# Patient Record
Sex: Female | Born: 1935 | Race: White | Hispanic: No | Marital: Married | Smoking: Former smoker
Health system: Southern US, Community
[De-identification: ages and names within clinical notes are randomized; demographics above are authoritative.]

## PROBLEM LIST (undated history)

## (undated) DIAGNOSIS — I509 Heart failure, unspecified: Secondary | ICD-10-CM

## (undated) DIAGNOSIS — M069 Rheumatoid arthritis, unspecified: Secondary | ICD-10-CM

## (undated) DIAGNOSIS — J449 Chronic obstructive pulmonary disease, unspecified: Secondary | ICD-10-CM

## (undated) DIAGNOSIS — I219 Acute myocardial infarction, unspecified: Secondary | ICD-10-CM

## (undated) DIAGNOSIS — M810 Age-related osteoporosis without current pathological fracture: Secondary | ICD-10-CM

## (undated) DIAGNOSIS — I471 Supraventricular tachycardia, unspecified: Secondary | ICD-10-CM

## (undated) DIAGNOSIS — I10 Essential (primary) hypertension: Secondary | ICD-10-CM

## (undated) DIAGNOSIS — F419 Anxiety disorder, unspecified: Secondary | ICD-10-CM

## (undated) DIAGNOSIS — C439 Malignant melanoma of skin, unspecified: Secondary | ICD-10-CM

## (undated) DIAGNOSIS — J45909 Unspecified asthma, uncomplicated: Secondary | ICD-10-CM

## (undated) HISTORY — PX: ABDOMINAL HYSTERECTOMY: SHX81

## (undated) HISTORY — DX: Anxiety disorder, unspecified: F41.9

## (undated) HISTORY — DX: Supraventricular tachycardia, unspecified: I47.10

## (undated) HISTORY — DX: Chronic obstructive pulmonary disease, unspecified: J44.9

## (undated) HISTORY — PX: BREAST SURGERY: SHX581

## (undated) HISTORY — PX: SHOULDER SURGERY: SHX246

## (undated) HISTORY — DX: Heart failure, unspecified: I50.9

## (undated) HISTORY — PX: CARPAL TUNNEL RELEASE: SHX101

## (undated) HISTORY — DX: Supraventricular tachycardia: I47.1

## (undated) HISTORY — PX: CARDIAC SURGERY: SHX584

## (undated) SURGERY — VIDEO BRONCHOSCOPY WITHOUT FLUORO
Anesthesia: Moderate Sedation

---

## 1997-09-10 DIAGNOSIS — I219 Acute myocardial infarction, unspecified: Secondary | ICD-10-CM

## 1997-09-10 HISTORY — DX: Acute myocardial infarction, unspecified: I21.9

## 2004-06-23 ENCOUNTER — Encounter: Admission: RE | Admit: 2004-06-23 | Discharge: 2004-06-23 | Payer: Self-pay | Admitting: Cardiology

## 2005-01-08 ENCOUNTER — Ambulatory Visit: Payer: Self-pay | Admitting: Family Medicine

## 2005-01-08 ENCOUNTER — Observation Stay (HOSPITAL_COMMUNITY): Admission: EM | Admit: 2005-01-08 | Discharge: 2005-01-09 | Payer: Self-pay | Admitting: *Deleted

## 2005-01-08 ENCOUNTER — Ambulatory Visit: Payer: Self-pay | Admitting: Cardiology

## 2005-06-15 ENCOUNTER — Ambulatory Visit: Payer: Self-pay | Admitting: Family Medicine

## 2005-06-22 ENCOUNTER — Ambulatory Visit: Payer: Self-pay | Admitting: Family Medicine

## 2005-06-27 ENCOUNTER — Ambulatory Visit: Payer: Self-pay | Admitting: Oncology

## 2005-07-09 ENCOUNTER — Ambulatory Visit (HOSPITAL_COMMUNITY): Admission: RE | Admit: 2005-07-09 | Discharge: 2005-07-09 | Payer: Self-pay | Admitting: Surgery

## 2005-07-10 ENCOUNTER — Encounter: Admission: RE | Admit: 2005-07-10 | Discharge: 2005-07-10 | Payer: Self-pay | Admitting: Surgery

## 2005-07-30 ENCOUNTER — Ambulatory Visit: Payer: Self-pay | Admitting: Family Medicine

## 2005-07-31 ENCOUNTER — Ambulatory Visit: Payer: Self-pay | Admitting: Family Medicine

## 2005-08-01 ENCOUNTER — Ambulatory Visit (HOSPITAL_COMMUNITY): Admission: RE | Admit: 2005-08-01 | Discharge: 2005-08-02 | Payer: Self-pay | Admitting: Surgery

## 2005-08-01 ENCOUNTER — Encounter (INDEPENDENT_AMBULATORY_CARE_PROVIDER_SITE_OTHER): Payer: Self-pay | Admitting: *Deleted

## 2005-09-10 HISTORY — PX: MELANOMA EXCISION: SHX5266

## 2005-12-03 ENCOUNTER — Encounter: Admission: RE | Admit: 2005-12-03 | Discharge: 2005-12-03 | Payer: Self-pay | Admitting: Family Medicine

## 2005-12-03 ENCOUNTER — Ambulatory Visit: Payer: Self-pay | Admitting: Family Medicine

## 2005-12-03 ENCOUNTER — Ambulatory Visit: Payer: Self-pay | Admitting: Oncology

## 2005-12-19 ENCOUNTER — Ambulatory Visit: Payer: Self-pay | Admitting: Family Medicine

## 2006-04-11 ENCOUNTER — Encounter: Admission: RE | Admit: 2006-04-11 | Discharge: 2006-04-11 | Payer: Self-pay | Admitting: Surgery

## 2006-04-13 ENCOUNTER — Inpatient Hospital Stay (HOSPITAL_COMMUNITY): Admission: AD | Admit: 2006-04-13 | Discharge: 2006-04-13 | Payer: Self-pay | Admitting: Gynecology

## 2006-05-31 ENCOUNTER — Ambulatory Visit: Payer: Self-pay | Admitting: Oncology

## 2006-06-04 LAB — CBC WITH DIFFERENTIAL/PLATELET
Basophils Absolute: 0 10*3/uL (ref 0.0–0.1)
EOS%: 3.5 % (ref 0.0–7.0)
Eosinophils Absolute: 0.2 10*3/uL (ref 0.0–0.5)
HGB: 13.4 g/dL (ref 11.6–15.9)
LYMPH%: 33.2 % (ref 14.0–48.0)
MCH: 29.5 pg (ref 26.0–34.0)
MCV: 86.7 fL (ref 81.0–101.0)
MONO%: 8.2 % (ref 0.0–13.0)
Platelets: 324 10*3/uL (ref 145–400)
RDW: 14 % (ref 11.3–14.5)

## 2006-06-04 LAB — COMPREHENSIVE METABOLIC PANEL
ALT: 22 U/L (ref 0–40)
Alkaline Phosphatase: 42 U/L (ref 39–117)
Glucose, Bld: 100 mg/dL — ABNORMAL HIGH (ref 70–99)
Sodium: 143 mEq/L (ref 135–145)
Total Bilirubin: 0.6 mg/dL (ref 0.3–1.2)
Total Protein: 7.2 g/dL (ref 6.0–8.3)

## 2006-06-05 ENCOUNTER — Ambulatory Visit: Payer: Self-pay | Admitting: Family Medicine

## 2006-06-06 ENCOUNTER — Ambulatory Visit: Payer: Self-pay | Admitting: Internal Medicine

## 2006-06-18 ENCOUNTER — Ambulatory Visit: Payer: Self-pay | Admitting: Family Medicine

## 2006-11-28 ENCOUNTER — Ambulatory Visit: Payer: Self-pay | Admitting: Oncology

## 2006-12-03 LAB — CBC WITH DIFFERENTIAL/PLATELET
BASO%: 1.2 % (ref 0.0–2.0)
Basophils Absolute: 0.1 10*3/uL (ref 0.0–0.1)
HCT: 36.3 % (ref 34.8–46.6)
HGB: 13 g/dL (ref 11.6–15.9)
LYMPH%: 40 % (ref 14.0–48.0)
MCHC: 35.7 g/dL (ref 32.0–36.0)
MONO#: 0.4 10*3/uL (ref 0.1–0.9)
NEUT%: 46.1 % (ref 39.6–76.8)
Platelets: 281 10*3/uL (ref 145–400)
WBC: 4.8 10*3/uL (ref 3.9–10.0)

## 2006-12-03 LAB — COMPREHENSIVE METABOLIC PANEL
BUN: 20 mg/dL (ref 6–23)
CO2: 27 mEq/L (ref 19–32)
Calcium: 9.3 mg/dL (ref 8.4–10.5)
Creatinine, Ser: 0.77 mg/dL (ref 0.40–1.20)
Glucose, Bld: 74 mg/dL (ref 70–99)
Total Bilirubin: 0.4 mg/dL (ref 0.3–1.2)

## 2006-12-03 LAB — LACTATE DEHYDROGENASE: LDH: 173 U/L (ref 94–250)

## 2007-05-19 DIAGNOSIS — I2510 Atherosclerotic heart disease of native coronary artery without angina pectoris: Secondary | ICD-10-CM | POA: Insufficient documentation

## 2007-05-19 DIAGNOSIS — J45909 Unspecified asthma, uncomplicated: Secondary | ICD-10-CM | POA: Insufficient documentation

## 2007-05-19 DIAGNOSIS — J449 Chronic obstructive pulmonary disease, unspecified: Secondary | ICD-10-CM

## 2007-05-19 DIAGNOSIS — M810 Age-related osteoporosis without current pathological fracture: Secondary | ICD-10-CM | POA: Insufficient documentation

## 2007-05-19 DIAGNOSIS — I10 Essential (primary) hypertension: Secondary | ICD-10-CM | POA: Insufficient documentation

## 2007-05-19 DIAGNOSIS — E785 Hyperlipidemia, unspecified: Secondary | ICD-10-CM | POA: Insufficient documentation

## 2007-05-19 DIAGNOSIS — J441 Chronic obstructive pulmonary disease with (acute) exacerbation: Secondary | ICD-10-CM | POA: Insufficient documentation

## 2007-05-19 DIAGNOSIS — K219 Gastro-esophageal reflux disease without esophagitis: Secondary | ICD-10-CM | POA: Insufficient documentation

## 2007-05-19 DIAGNOSIS — I252 Old myocardial infarction: Secondary | ICD-10-CM | POA: Insufficient documentation

## 2007-05-20 DIAGNOSIS — I739 Peripheral vascular disease, unspecified: Secondary | ICD-10-CM | POA: Insufficient documentation

## 2007-08-25 ENCOUNTER — Ambulatory Visit: Payer: Self-pay | Admitting: Oncology

## 2007-08-27 ENCOUNTER — Ambulatory Visit (HOSPITAL_COMMUNITY): Admission: RE | Admit: 2007-08-27 | Discharge: 2007-08-27 | Payer: Self-pay | Admitting: Oncology

## 2007-08-27 LAB — COMPREHENSIVE METABOLIC PANEL
ALT: 33 U/L (ref 0–35)
Alkaline Phosphatase: 42 U/L (ref 39–117)
CO2: 27 mEq/L (ref 19–32)
Creatinine, Ser: 0.83 mg/dL (ref 0.40–1.20)
Sodium: 141 mEq/L (ref 135–145)
Total Bilirubin: 0.5 mg/dL (ref 0.3–1.2)
Total Protein: 6.8 g/dL (ref 6.0–8.3)

## 2007-08-27 LAB — CBC WITH DIFFERENTIAL/PLATELET
BASO%: 0.8 % (ref 0.0–2.0)
EOS%: 3.6 % (ref 0.0–7.0)
HCT: 38.2 % (ref 34.8–46.6)
LYMPH%: 38.5 % (ref 14.0–48.0)
MCH: 30.7 pg (ref 26.0–34.0)
MCHC: 34.7 g/dL (ref 32.0–36.0)
MCV: 88.6 fL (ref 81.0–101.0)
MONO%: 8.4 % (ref 0.0–13.0)
NEUT%: 48.7 % (ref 39.6–76.8)
Platelets: 293 10*3/uL (ref 145–400)
RBC: 4.32 10*6/uL (ref 3.70–5.32)
WBC: 5.8 10*3/uL (ref 3.9–10.0)

## 2007-08-27 LAB — LACTATE DEHYDROGENASE: LDH: 209 U/L (ref 94–250)

## 2008-03-22 ENCOUNTER — Ambulatory Visit: Payer: Self-pay | Admitting: Oncology

## 2008-03-25 LAB — CBC WITH DIFFERENTIAL/PLATELET
BASO%: 0.3 % (ref 0.0–2.0)
EOS%: 0.4 % (ref 0.0–7.0)
MCH: 30.5 pg (ref 26.0–34.0)
MCHC: 34.4 g/dL (ref 32.0–36.0)
MONO%: 5.6 % (ref 0.0–13.0)
RDW: 12.5 % (ref 11.3–14.5)
lymph#: 1.5 10*3/uL (ref 0.9–3.3)

## 2008-03-25 LAB — COMPREHENSIVE METABOLIC PANEL
ALT: 35 U/L (ref 0–35)
AST: 20 U/L (ref 0–37)
Albumin: 4.7 g/dL (ref 3.5–5.2)
Alkaline Phosphatase: 32 U/L — ABNORMAL LOW (ref 39–117)
Calcium: 9.9 mg/dL (ref 8.4–10.5)
Chloride: 103 mEq/L (ref 96–112)
Creatinine, Ser: 0.83 mg/dL (ref 0.40–1.20)
Potassium: 4.4 mEq/L (ref 3.5–5.3)

## 2008-08-23 ENCOUNTER — Ambulatory Visit: Payer: Self-pay | Admitting: Oncology

## 2008-08-25 ENCOUNTER — Ambulatory Visit (HOSPITAL_COMMUNITY): Admission: RE | Admit: 2008-08-25 | Discharge: 2008-08-25 | Payer: Self-pay | Admitting: Oncology

## 2008-08-25 LAB — CBC WITH DIFFERENTIAL/PLATELET
BASO%: 0.7 % (ref 0.0–2.0)
Basophils Absolute: 0 10*3/uL (ref 0.0–0.1)
EOS%: 2.2 % (ref 0.0–7.0)
Eosinophils Absolute: 0.1 10*3/uL (ref 0.0–0.5)
HCT: 38.8 % (ref 34.8–46.6)
HGB: 13.3 g/dL (ref 11.6–15.9)
LYMPH%: 34 % (ref 14.0–48.0)
MCH: 30.9 pg (ref 26.0–34.0)
MCHC: 34.2 g/dL (ref 32.0–36.0)
MCV: 90.3 fL (ref 81.0–101.0)
MONO#: 0.6 10*3/uL (ref 0.1–0.9)
MONO%: 10.7 % (ref 0.0–13.0)
NEUT#: 2.8 10*3/uL (ref 1.5–6.5)
NEUT%: 52.4 % (ref 39.6–76.8)
Platelets: 273 10*3/uL (ref 145–400)
RBC: 4.3 10*6/uL (ref 3.70–5.32)
RDW: 13.1 % (ref 11.3–14.5)
WBC: 5.3 10*3/uL (ref 3.9–10.0)
lymph#: 1.8 10*3/uL (ref 0.9–3.3)

## 2008-08-25 LAB — COMPREHENSIVE METABOLIC PANEL
ALT: 27 U/L (ref 0–35)
Albumin: 4.3 g/dL (ref 3.5–5.2)
CO2: 27 mEq/L (ref 19–32)
Calcium: 9.3 mg/dL (ref 8.4–10.5)
Chloride: 101 mEq/L (ref 96–112)
Potassium: 3.8 mEq/L (ref 3.5–5.3)
Sodium: 137 mEq/L (ref 135–145)
Total Protein: 6.4 g/dL (ref 6.0–8.3)

## 2008-08-25 LAB — LACTATE DEHYDROGENASE: LDH: 183 U/L (ref 94–250)

## 2009-08-30 ENCOUNTER — Ambulatory Visit: Payer: Self-pay | Admitting: Oncology

## 2009-08-30 LAB — COMPREHENSIVE METABOLIC PANEL
ALT: 22 U/L (ref 0–35)
AST: 26 U/L (ref 0–37)
Albumin: 4.7 g/dL (ref 3.5–5.2)
Alkaline Phosphatase: 36 U/L — ABNORMAL LOW (ref 39–117)
BUN: 26 mg/dL — ABNORMAL HIGH (ref 6–23)
CO2: 26 mEq/L (ref 19–32)
Calcium: 9.9 mg/dL (ref 8.4–10.5)
Chloride: 102 mEq/L (ref 96–112)
Creatinine, Ser: 1.05 mg/dL (ref 0.40–1.20)
Glucose, Bld: 100 mg/dL — ABNORMAL HIGH (ref 70–99)
Potassium: 4.6 mEq/L (ref 3.5–5.3)
Sodium: 139 mEq/L (ref 135–145)
Total Bilirubin: 0.5 mg/dL (ref 0.3–1.2)
Total Protein: 7.4 g/dL (ref 6.0–8.3)

## 2009-08-30 LAB — CBC WITH DIFFERENTIAL/PLATELET
BASO%: 0.5 % (ref 0.0–2.0)
Basophils Absolute: 0 10*3/uL (ref 0.0–0.1)
EOS%: 2.5 % (ref 0.0–7.0)
Eosinophils Absolute: 0.1 10*3/uL (ref 0.0–0.5)
HCT: 40.1 % (ref 34.8–46.6)
HGB: 13.6 g/dL (ref 11.6–15.9)
LYMPH%: 30.5 % (ref 14.0–49.7)
MCH: 31.4 pg (ref 25.1–34.0)
MCHC: 34 g/dL (ref 31.5–36.0)
MCV: 92.2 fL (ref 79.5–101.0)
MONO#: 0.5 10*3/uL (ref 0.1–0.9)
MONO%: 9.3 % (ref 0.0–14.0)
NEUT#: 3.4 10*3/uL (ref 1.5–6.5)
NEUT%: 57.2 % (ref 38.4–76.8)
Platelets: 281 10*3/uL (ref 145–400)
RBC: 4.35 10*6/uL (ref 3.70–5.45)
RDW: 13 % (ref 11.2–14.5)
WBC: 5.9 10*3/uL (ref 3.9–10.3)
lymph#: 1.8 10*3/uL (ref 0.9–3.3)

## 2009-08-30 LAB — LACTATE DEHYDROGENASE: LDH: 221 U/L (ref 94–250)

## 2010-08-28 ENCOUNTER — Ambulatory Visit: Payer: Self-pay | Admitting: Oncology

## 2010-08-29 LAB — CBC WITH DIFFERENTIAL/PLATELET
BASO%: 0.6 % (ref 0.0–2.0)
Basophils Absolute: 0.1 10*3/uL (ref 0.0–0.1)
EOS%: 0.4 % (ref 0.0–7.0)
Eosinophils Absolute: 0 10*3/uL (ref 0.0–0.5)
HCT: 35.4 % (ref 34.8–46.6)
HGB: 12.3 g/dL (ref 11.6–15.9)
LYMPH%: 12.8 % — ABNORMAL LOW (ref 14.0–49.7)
MCH: 31.2 pg (ref 25.1–34.0)
MCHC: 34.7 g/dL (ref 31.5–36.0)
MCV: 89.9 fL (ref 79.5–101.0)
MONO#: 1.2 10*3/uL — ABNORMAL HIGH (ref 0.1–0.9)
MONO%: 10 % (ref 0.0–14.0)
NEUT#: 8.8 10*3/uL — ABNORMAL HIGH (ref 1.5–6.5)
NEUT%: 76.2 % (ref 38.4–76.8)
Platelets: 279 10*3/uL (ref 145–400)
RBC: 3.94 10*6/uL (ref 3.70–5.45)
RDW: 13.1 % (ref 11.2–14.5)
WBC: 11.5 10*3/uL — ABNORMAL HIGH (ref 3.9–10.3)
lymph#: 1.5 10*3/uL (ref 0.9–3.3)

## 2010-08-29 LAB — COMPREHENSIVE METABOLIC PANEL
ALT: 14 U/L (ref 0–35)
AST: 17 U/L (ref 0–37)
Albumin: 4.2 g/dL (ref 3.5–5.2)
Alkaline Phosphatase: 54 U/L (ref 39–117)
BUN: 21 mg/dL (ref 6–23)
CO2: 26 mEq/L (ref 19–32)
Calcium: 9.3 mg/dL (ref 8.4–10.5)
Chloride: 96 mEq/L (ref 96–112)
Creatinine, Ser: 1.11 mg/dL (ref 0.40–1.20)
Glucose, Bld: 103 mg/dL — ABNORMAL HIGH (ref 70–99)
Potassium: 4.1 mEq/L (ref 3.5–5.3)
Sodium: 132 mEq/L — ABNORMAL LOW (ref 135–145)
Total Bilirubin: 0.8 mg/dL (ref 0.3–1.2)
Total Protein: 6.6 g/dL (ref 6.0–8.3)

## 2010-08-29 LAB — LACTATE DEHYDROGENASE: LDH: 178 U/L (ref 94–250)

## 2011-01-26 NOTE — Discharge Summary (Signed)
NAMERASHAWN, ROLON NO.:  [id number]   MEDICAL RECORD NO.:  [id number]          PATIENT TYPE:  INP   LOCATION:  3740                         FACILITY:  [facility]   PHYSICIAN:  Salvadore Farber, M.D. LHCDATE OF BIRTH:  [birth year]   DATE OF ADMISSION:  01/08/2005  DATE OF DISCHARGE:  01/09/2005                                 DISCHARGE SUMMARY   PRINCIPAL DIAGNOSIS:  Chest pain.   OTHER DIAGNOSES:  1.  Hypertension.  2.  Hyperlipidemia.  3.  Gastroesophageal reflux disease.  4.  Status post bilateral mastectomy in 1983 secondary to fibrotic disease.  5.  Status post hysterectomy in 1964.   ALLERGIES:  No known drug allergies.   PROCEDURE:  Adenosine Myoview which was negative.   HISTORY OF PRESENT ILLNESS:  77 year old married white female with reported  history of MI in 1999 was evaluated with catheterization at [facility] in [place] which showed no significant disease.  She had recurrent chest discomfort in 2001 and was evaluated with cardiac  catheterization at [facility], again showing no significant disease, and  at that time, she was initiated on Prevacid for treatment of GERD.  Over the  years, she has been doing well and has been exercising daily, walking 30-45  minutes a day without limitations until approximately one week ago when she  began to experience daily exertional 8-10 retrosternal chest pressure  associated with shortness of breath and occasional light headedness  lasting  approximately 15-20 minutes and relieved with rest.  She often feels  fatigued for several hours after these symptoms and noted that these  symptoms are exactly similar to what she had prior to other catheterization  at which time no coronary disease was noted.  She saw her primary care  physician on the morning of May 1 and had an EKG which was normal and was  sent to the ED secondary to her recent bout of symptoms.  In the ED, she was  pain free and the EKG was normal.  Cardiac enzymes were negative x 3.  Because her symptoms presently are similar to what she had prior to previous  normal catheterizations as well as the fact that the patient wanted to avoid  cardiac catheterization, we opted for Adenosine Myoview.   HOSPITAL COURSE:  The patient underwent Adenosine Myoview functional study  this morning which showed an EF of 55-65% with normal wall motion and  possible inferobasal scar without reversible ischemia.  She has not had any  recurrent chest discomfort and she is being discharged home today in  satisfactory condition.   LABORATORY DATA:  Hemoglobin 13.8, hematocrit 40.8, WBC 6.2, platelets 319.  Sodium 139, potassium 4, chloride 107, CO2 26, BUN 19, creatinine 0.7,  glucose 95, total bilirubin 0.8, alkaline phos 62, AST 26, ALT 28, total  protein 7.1, albumin 4.5, calcium 9.5, protime 12, INR 0.9, PTT 37.  Cardiac  enzymes are negative x 3.  TSH 1.348.   DISCHARGE PHYSICAL EXAM:  Temperature 98, heart rate 57, respirations 18,  blood pressure 130/70, pulse ox 94% on room  air.  Pleasant white female in  no acute distress.  Awake, alert, and oriented x 3.  Neck normal carotid  upstrokes, no bruits, no JVD.  Lungs:  Respirations regular and unlabored,  clear to auscultation.  Heart:  Normal S1 and S2, no S3, S4, or murmurs.  Abdomen:  Round, soft, nontender, nondistended, bowel sounds present x 4, no  hepatomegaly.  Extremities:  Warm, dry, and pink.  No cyanosis, clubbing,  and edema.  Dorsalis pedes and posterior tibial pulses are 2+ and equal  bilaterally.   DISPOSITION:  The patient is being discharged home in good condition.  Follow up with her primary care physician, Dr. Tinnie Gens A. Todd, in 1-2 weeks  for further evaluation of noncardiac chest pain.   DISCHARGE MEDICATIONS:  Prevacid 30 mg b.i.d., Lotensin 20 mg b.i.d., Toprol  XL 100 mg b.i.d., Lipitor 20 mg daily, Norvasc 5 mg daily, Fosamax 70 mg   each week, Combivent inhaler 2 puffs daily.   Outstanding lab studies are none.  Duration of discharge encounter 40  minutes.      CRB/MEDQ  D:  01/09/2005  T:  01/09/2005  Job:  [id number]   cc:   Tinnie Gens A. Tawanna Cooler, M.D. [facility]

## 2011-01-26 NOTE — Discharge Summary (Signed)
NAMEMARIELOUISE, AMEY NO.:  [id number]   MEDICAL RECORD NO.:  [id number]          PATIENT TYPE:  INP   LOCATION:  5712                         FACILITY:  [facility]   PHYSICIAN:  Clovis Pu. Cornett, M.D.DATE OF BIRTH:  16-Apr-1933   DATE OF ADMISSION:  08/01/2005  DATE OF DISCHARGE:  08/02/2005                                 DISCHARGE SUMMARY   ADMISSION DIAGNOSIS:  Left scalp melanoma.   DISCHARGE DIAGNOSIS:  Left scalp melanoma.   PROCEDURE PERFORMED:  Wide excision left scalp melanoma with full thickness  skin graft and sentinel lymph node mapping.   HISTORY OF PRESENT ILLNESS:  The patient is a 77 year old female, who was  found to have a left scalp melanoma. She was brought into the operating room  for wide excision and sentinel lymph node mapping of this.   HOSPITAL COURSE:  The patient underwent successful wide excision of left  scalp melanoma, with sentinel lymph node mapping, with full thickness skin  graft.   She was discharged home on postoperative day #1 in satisfactory condition,  without any complaints.   DISCHARGE INSTRUCTIONS:  She will follow up next week to have her dressings  and sutures removed. She will refrain from washing her hair until she  returns. She will resume her preoperative medications and diet.   CONDITION ON DISCHARGE:  Satisfactory.      Thomas A. Cornett, M.D.  Electronically Signed     TAC/MEDQ  D:  09/26/2005  T:  09/26/2005  Job:  [id number]

## 2011-01-26 NOTE — H&P (Signed)
[name], [name] NO.:  [id number]   MEDICAL RECORD NO.:  [id number]          PATIENT TYPE:  INP   LOCATION:  1829                         FACILITY:  [facility]   PHYSICIAN:  [name], M.D.   DATE OF BIRTH:  12-May-1933   DATE OF ADMISSION:  01/08/2005  DATE OF DISCHARGE:                                HISTORY & PHYSICAL   PHYSICIANS:  Primary care physician: [name], M.D.  Primary cardiologist:  [name], M.D.   PATIENT PROFILE AND CHIEF COMPLAINT:  A 77 year old white female with prior  history of chest pain who presents with recurrent chest pain and shortness  of breath similar to previous symptoms.   PAST MEDICAL HISTORY AND PROBLEM LIST:  1.  Chest pain.      1.  Status post myocardial infarction in 1999 (per patient) with          catheterization performed at [facility] in          [place] showing normal coronaries.      2.  Re-look catheterization at [facility] in 2001 revealing normal          coronaries.  2.  Hypertension.  3.  Hyperlipidemia.  4.  Gastroesophageal reflux disease.  5.  Status post bilateral mastectomy in 1982 secondary to fibrotic disease.  6.  Status post hysterectomy in 1964.   HISTORY OF PRESENT ILLNESS:  A 77 year old (will be 5 on May 2) married  white female with a history of MI in 1999 per patient that was evaluated  with catheterization at [facility] in [place]  which showed no significant disease.  The last catheterization was in 2001  in [facility], again showing no significant CAD.  At that time she was  diagnosed with GERD and started on Prevacid.  Over the years, she has been  doing well and has been exercising daily, walking 30 to 45 minutes a day  without limitations until approximately one week ago when she began to  experience daily, exertional, 8/10 retrosternal chest pressure associated  with shortness of breath and occasional  lightheadedness lasting  approximately 15 to 20 minutes and relieved with rest.  She often feels  fatigues for several hours after these symptoms and notes that these  symptoms are exactly similar to what she had prior to other  catheterizations.  She saw her primary care physician this a.m..  Her ECG  was normal, and she was sent to the ED.  She has not had any chest pain  today.  Cardiac enzymes are currently pending.   ALLERGIES:  No known drug allergies.   MEDICATIONS:  1.  Prevacid 30 mg daily.  2.  Lotensin 20 mg b.i.d.  3.  Toprol XL 100 mg b.i.d.  4.  Lipitor 20 mg daily.  5.  Norvasc 5 mg daily.  6.  Fosamax 70 mg weekly.  7.  Combivent 2 puffs daily.   FAMILY HISTORY:  Mother died of breast cancer at age 60.  Father died of  colon cancer at age 39.  She has two brothers and a sister.  There is CAD/MI  and hypertension in one brother.   SOCIAL HISTORY:  She lives in [place] with her husband.  She is  currently retired.  She has three grown children.  She smoked one-half pack  a day for 40 years and quit in 1993.  She denies any alcohol or drugs.   REVIEW OF SYSTEMS:  She reports chest pain, shortness of breath, and dyspnea  on exertion and also has a history of nocturia.  All other systems reviewed  and negative.   PHYSICAL EXAMINATION:  VITAL SIGNS:  Temperature 97.1, heart rate 63,  respirations 18, blood pressure 185/91, pulse oximetry 99% on room air.  GENERAL: Pleasant white female in no acute distress.  Awake, alert, oriented  x 3.  NECK:  Normal carotid upstrokes, no bruits or JVD.  LUNGS:  Respirations unlabored, clear to auscultation.  CARDIAC:  Regular S1 and S2.  No S3, S4, or murmurs.  ABDOMEN:  Round, soft, nontender, nondistended.  Bowel sounds present x 4.  No hepatomegaly.  EXTREMITIES:  Warm, dry, pink.  No clubbing, cyanosis, or edema.  Dorsalis  pedis, posterior tibial pulses 2+ and equal bilaterally.   ACCESSORY CLINICAL FINDINGS:  Chest  x-ray is pending.   EKG shows sinus rhythm at rate of 60 beats per minute, normal axis, and T  wave conversion in leads aVL, V1, V2.   Hemoglobin 13.8, hematocrit 40.8, WBC 6.2, platelets 319.  CHEM-7 and  cardiac enzymes are pending.  PTT 37, PT 12.0, INR 0.9.   ASSESSMENT AND PLAN:  1.  Chest pain.  The patient has a history of exertional chest pain dating      back to at least 1999 at which time she says she had an myocardial      infarction and then had catheterization showing no significant disease.      She had another catheterization in 2001 at [facility] which again      showed no significant disease.  Since then, she has been treated for      gastroesophageal reflux disease with Prevacid.  She is fairly active at      home and had been doing well until about a week ago when she began to      have recurrent exertional chest discomfort similar to previous symptoms.      Will cycle her enzymes.  Given her history of similar chest pain with      subsequent normal catheterizations as well as her wishes to avoid      catheterization, will plan on an adenosine Myoview in the a.m.  We would      prefer to exercise her; however, given the fact that she has been taking      Toprol XL 100 b.i.d., it is unlikely we are going to get her heart rate      to where we want it.  Further, she is going to need her Toprol for blood      pressure control given her hypertension.  Will continue aspirin and      statin, Lotensin, and Lipitor.  2.  Hypertension.  Blood pressure is elevated at 185/91.  Will continue her      current regimen and increase Norvasc to 10 mg daily.  I see in the      records that she has previously been treated with clonidine but is not      currently  treated with that.  3.  Hyperlipidemia.  Will check lipids and LFTs.  Continue Lipitor.  4.  Gastroesophageal reflux disease, stable.  Continue PPI.     CRB/MEDQ  D:  01/08/2005  T:  01/08/2005  Job:  [id number]    cc:   [name], M.D. [facility]   [name], M.D. [facility]  [address]  [address]  [place]  [place] [address]

## 2011-01-26 NOTE — Op Note (Signed)
[name], [name] NO.:  [id number]   MEDICAL RECORD NO.:  [id number]          PATIENT TYPE:  INP   LOCATION:  5712                         FACILITY:  [facility]   PHYSICIAN:  Clovis Pu. Carey, M.D.DATE OF BIRTH:  05-25-1933   DATE OF PROCEDURE:  DATE OF DISCHARGE:                                 OPERATIVE REPORT   PREOP DIAGNOSIS:  Left scalp melanoma.   POSTOP DIAGNOSIS:  Left scalp melanoma.   PROCEDURE:  1.  Wide excision measuring roughly 3 x 3 cm of left scalp melanoma.  2.  Left cervical sentinel lymph node mapping with methylene blue dye.  3.  Full-thickness skin graft for closure of left scalp wound.   SURGEON:  Maisie Fus A. Carey, M.D.   ASSISTANT:  Ollen Gross. Carolynne Edouard, M.D.   ANESTHESIA:  General endotracheal anesthesia.   ESTIMATED BLOOD LOSS:  20 mL.   SPECIMEN:  1.  Left scalp skin specimen to pathology.  2.  Two left posterior auricular sentinel nodes, blue and hot to pathology      for further evaluation.   DRAINS:  None.   INDICATIONS FOR PROCEDURE:  The patient is a 77 year old female was noted to  have a 1-mm, left scalp melanoma. She was given a Clark's level 3.  After  examining her I recommended a wide excision with sentinel lymph node mapping  and potential skin grafting to close the defect.   DESCRIPTION OF PROCEDURE:  After the patient underwent sentinel lymph node  injection in radiology, she was brought to the operating room and placed  supine, initially on the table. General endotracheal anesthesia was  initiated and she was placed with her left side up; and was appropriately  cushioned and taped, such that her right side was down. At this point, her  skin hair was shaved from around the lesion, which was on her left scalp.  Also a NeoProbe was used; and there were hot nodes in the posterior  auricular drainage basin; this is on the left side.   Next, the areas were prepped and draped in a sterile fashion. I started with  the  lymph node mapping first. Methylene blue dye was injected around the  melanoma and let to circulate for 5 minutes. NeoProbe was used and a small  incision was made in the posterior auricular region over the mastoid.  Dissection was carried down and two hot blue nodes were encountered in the  posterior auricular region which were sent to pathology for further  evaluation. The remainder of the cervical region had counts, much less than  10% below the injection sites.   Next, the melanoma was addressed. I measured a 1-cm margin circumferentially  and then excised the melanoma with a 1-cm margin down to the skull. This was  done, and the specimen was passed off the field. Given the defect measured  3.3 cm, I elected to close the ends of the defect with interrupted 3-0  nylon. For the remainder of the defect I felt a full-thickness skin graft  would be warranted.  I harvested skin just behind her left ear  with some  hair follicles in it, such that it would match her hair there that was  shaved. This was done and excised down to subcu fat. I then took the skin  specimen and removed all fat from the undersurface of the skin specimen to  where I was down to dermis. I then fenestrated the skin graft with a #11  blade to facilitate drainage and also to get more stretch out of it. I then  sewed this into the defect with a 3-0 nylon circumferentially. There was  some tension on the skin graft, but it seemed to cover the defect  adequately.   I then closed the donor site with a 3-0 Vicryl in a subcuticular fashion. At  this point in time, I then placed six bolster sutures using 3-0 nylon around  the skin graft site. I then placed Xeroform over the skin graft and two  large cotton balls and tied the bolster sutures around compressing the skin  graft to the skull for good contact. Neosporin was placed at all these  sites. After closing both of these sentinel lymph nodes harvest site which  was in the  posterior the region on the left as well as a harvest site which  is above that in the posterior auricular region on the left, Steri-Strips  were placed on both of these.   The patient was then wrapped in a sterile dressing for her head and a burn  net was used to hold this into place. The patient was then placed supine,  extubated, and taken to recovery in satisfactory condition. All sponge,  needle, and instrument counts were counted and found to be correct at this  portion of the case.      [name], M.D.  Electronically Signed     TAC/MEDQ  D:  08/01/2005  T:  08/01/2005  Job:  [id number]   cc:   Tinnie Gens A. Tawanna Cooler, M.D. [facility]  [address] [place]  [place] [address]   [facility]

## 2011-09-11 HISTORY — PX: MELANOMA EXCISION: SHX5266

## 2013-02-22 ENCOUNTER — Emergency Department (HOSPITAL_COMMUNITY)
Admission: EM | Admit: 2013-02-22 | Discharge: 2013-02-22 | Disposition: A | Discharge status: Nursing Home (Includes ICF) | Payer: BC Managed Care – PPO | Source: Home / Self Care | Attending: Emergency Medicine | Admitting: Emergency Medicine

## 2013-02-22 ENCOUNTER — Other Ambulatory Visit: Payer: Self-pay

## 2013-02-22 ENCOUNTER — Observation Stay (HOSPITAL_COMMUNITY)
Admission: EM | Admit: 2013-02-22 | Discharge: 2013-02-23 | DRG: 313 | Disposition: A | Discharge status: Home / Self Care | Payer: MEDICARE | Attending: Internal Medicine | Admitting: Internal Medicine

## 2013-02-22 ENCOUNTER — Encounter (HOSPITAL_COMMUNITY): Payer: Self-pay | Admitting: Vascular Surgery

## 2013-02-22 ENCOUNTER — Emergency Department (HOSPITAL_COMMUNITY): Payer: MEDICARE

## 2013-02-22 ENCOUNTER — Encounter (HOSPITAL_COMMUNITY): Payer: Self-pay | Admitting: Emergency Medicine

## 2013-02-22 DIAGNOSIS — I739 Peripheral vascular disease, unspecified: Secondary | ICD-10-CM

## 2013-02-22 DIAGNOSIS — R197 Diarrhea, unspecified: Secondary | ICD-10-CM | POA: Insufficient documentation

## 2013-02-22 DIAGNOSIS — I10 Essential (primary) hypertension: Secondary | ICD-10-CM | POA: Insufficient documentation

## 2013-02-22 DIAGNOSIS — K219 Gastro-esophageal reflux disease without esophagitis: Secondary | ICD-10-CM | POA: Diagnosis present

## 2013-02-22 DIAGNOSIS — M069 Rheumatoid arthritis, unspecified: Secondary | ICD-10-CM

## 2013-02-22 DIAGNOSIS — J441 Chronic obstructive pulmonary disease with (acute) exacerbation: Secondary | ICD-10-CM | POA: Diagnosis present

## 2013-02-22 DIAGNOSIS — R0789 Other chest pain: Principal | ICD-10-CM | POA: Insufficient documentation

## 2013-02-22 DIAGNOSIS — J4489 Other specified chronic obstructive pulmonary disease: Secondary | ICD-10-CM | POA: Insufficient documentation

## 2013-02-22 DIAGNOSIS — R82998 Other abnormal findings in urine: Secondary | ICD-10-CM | POA: Insufficient documentation

## 2013-02-22 DIAGNOSIS — J45909 Unspecified asthma, uncomplicated: Secondary | ICD-10-CM | POA: Diagnosis present

## 2013-02-22 DIAGNOSIS — I252 Old myocardial infarction: Secondary | ICD-10-CM | POA: Insufficient documentation

## 2013-02-22 DIAGNOSIS — J449 Chronic obstructive pulmonary disease, unspecified: Secondary | ICD-10-CM

## 2013-02-22 DIAGNOSIS — R079 Chest pain, unspecified: Secondary | ICD-10-CM

## 2013-02-22 DIAGNOSIS — E785 Hyperlipidemia, unspecified: Secondary | ICD-10-CM | POA: Insufficient documentation

## 2013-02-22 DIAGNOSIS — I209 Angina pectoris, unspecified: Secondary | ICD-10-CM

## 2013-02-22 DIAGNOSIS — Z79899 Other long term (current) drug therapy: Secondary | ICD-10-CM | POA: Insufficient documentation

## 2013-02-22 DIAGNOSIS — R112 Nausea with vomiting, unspecified: Secondary | ICD-10-CM | POA: Insufficient documentation

## 2013-02-22 DIAGNOSIS — I2510 Atherosclerotic heart disease of native coronary artery without angina pectoris: Secondary | ICD-10-CM

## 2013-02-22 HISTORY — DX: Essential (primary) hypertension: I10

## 2013-02-22 HISTORY — DX: Acute myocardial infarction, unspecified: I21.9

## 2013-02-22 HISTORY — DX: Rheumatoid arthritis, unspecified: M06.9

## 2013-02-22 HISTORY — DX: Unspecified asthma, uncomplicated: J45.909

## 2013-02-22 HISTORY — DX: Malignant melanoma of skin, unspecified: C43.9

## 2013-02-22 HISTORY — DX: Age-related osteoporosis without current pathological fracture: M81.0

## 2013-02-22 LAB — URINALYSIS, ROUTINE W REFLEX MICROSCOPIC
Nitrite: NEGATIVE
Specific Gravity, Urine: 1.014 (ref 1.005–1.030)
Urobilinogen, UA: 0.2 mg/dL (ref 0.0–1.0)
pH: 6.5 (ref 5.0–8.0)

## 2013-02-22 LAB — URINE MICROSCOPIC-ADD ON

## 2013-02-22 LAB — CBC
HCT: 35.1 % — ABNORMAL LOW (ref 36.0–46.0)
Hemoglobin: 12.2 g/dL (ref 12.0–15.0)
MCH: 30.9 pg (ref 26.0–34.0)
MCHC: 34.8 g/dL (ref 30.0–36.0)
MCV: 88.9 fL (ref 78.0–100.0)

## 2013-02-22 LAB — COMPREHENSIVE METABOLIC PANEL
BUN: 18 mg/dL (ref 6–23)
Calcium: 10 mg/dL (ref 8.4–10.5)
Creatinine, Ser: 0.73 mg/dL (ref 0.50–1.10)
GFR calc Af Amer: >90 mL/min (ref >90)
Glucose, Bld: 91 mg/dL (ref 70–99)
Sodium: 138 mEq/L (ref 135–145)
Total Protein: 7 g/dL (ref 6.0–8.3)

## 2013-02-22 LAB — POCT I-STAT, CHEM 8
Glucose, Bld: 92 mg/dL (ref 70–99)
HCT: 35 % — ABNORMAL LOW (ref 36.0–46.0)
Hemoglobin: 11.9 g/dL — ABNORMAL LOW (ref 12.0–15.0)
Potassium: 3.7 mEq/L (ref 3.5–5.1)
Sodium: 137 mEq/L (ref 135–145)
TCO2: 25 mmol/L (ref 0–100)

## 2013-02-22 LAB — PROTIME-INR: Prothrombin Time: 13.1 seconds (ref 11.6–15.2)

## 2013-02-22 LAB — MAGNESIUM: Magnesium: 1.8 mg/dL (ref 1.5–2.5)

## 2013-02-22 LAB — POCT I-STAT TROPONIN I

## 2013-02-22 MED ORDER — MORPHINE SULFATE 2 MG/ML IJ SOLN: 1.0000 mg | INTRAMUSCULAR | Status: DC | PRN

## 2013-02-22 MED ORDER — BENAZEPRIL HCL 20 MG PO TABS
20.0000 mg | ORAL_TABLET | Freq: Every day | ORAL | Status: DC
  Administered 2013-02-23: 20 mg via ORAL
  Filled 2013-02-22: qty 1

## 2013-02-22 MED ORDER — CALCIUM CARBONATE 600 MG PO TABS
600.0000 mg | ORAL_TABLET | Freq: Two times a day (BID) | ORAL | Status: DC
  Filled 2013-02-22 (×3): qty 1

## 2013-02-22 MED ORDER — GLUCOSAMINE HCL-GLUCOSAMIN SO4 200-300 MG PO TABS: 1.0000 | ORAL_TABLET | Freq: Every day | ORAL | Status: DC

## 2013-02-22 MED ORDER — ACETAMINOPHEN 650 MG RE SUPP: 650.0000 mg | Freq: Four times a day (QID) | RECTAL | Status: DC | PRN

## 2013-02-22 MED ORDER — NITROGLYCERIN 0.4 MG SL SUBL: 0.4000 mg | SUBLINGUAL_TABLET | SUBLINGUAL | Status: DC | PRN

## 2013-02-22 MED ORDER — FOLIC ACID 1 MG PO TABS
1.0000 mg | ORAL_TABLET | Freq: Every day | ORAL | Status: DC
  Administered 2013-02-23: 1 mg via ORAL
  Filled 2013-02-22: qty 1

## 2013-02-22 MED ORDER — MOMETASONE FURO-FORMOTEROL FUM 100-5 MCG/ACT IN AERO
2.0000 | INHALATION_SPRAY | Freq: Two times a day (BID) | RESPIRATORY_TRACT | Status: DC
  Administered 2013-02-22 – 2013-02-23 (×2): 2 via RESPIRATORY_TRACT
  Filled 2013-02-22: qty 8.8

## 2013-02-22 MED ORDER — SODIUM CHLORIDE 0.9 % IJ SOLN: 3.0000 mL | INTRAMUSCULAR | Status: DC | PRN

## 2013-02-22 MED ORDER — IBUPROFEN 400 MG PO TABS
400.0000 mg | ORAL_TABLET | Freq: Four times a day (QID) | ORAL | Status: DC | PRN
  Administered 2013-02-22: 400 mg via ORAL
  Filled 2013-02-22 (×2): qty 1

## 2013-02-22 MED ORDER — ASPIRIN 81 MG PO CHEW
324.0000 mg | CHEWABLE_TABLET | Freq: Once | ORAL | Status: AC
  Administered 2013-02-22: 324 mg via ORAL

## 2013-02-22 MED ORDER — SODIUM CHLORIDE 0.9 % IJ SOLN
3.0000 mL | Freq: Two times a day (BID) | INTRAMUSCULAR | Status: DC
  Administered 2013-02-23: 3 mL via INTRAVENOUS

## 2013-02-22 MED ORDER — PANTOPRAZOLE SODIUM 40 MG PO TBEC
40.0000 mg | DELAYED_RELEASE_TABLET | Freq: Every day | ORAL | Status: DC
  Administered 2013-02-23: 40 mg via ORAL
  Filled 2013-02-22: qty 1

## 2013-02-22 MED ORDER — SODIUM CHLORIDE 0.9 % IV SOLN: 250.0000 mL | INTRAVENOUS | Status: DC | PRN

## 2013-02-22 MED ORDER — DIPHENHYDRAMINE HCL 25 MG PO CAPS
25.0000 mg | ORAL_CAPSULE | Freq: Once | ORAL | Status: AC
  Administered 2013-02-22: 25 mg via ORAL
  Filled 2013-02-22: qty 1

## 2013-02-22 MED ORDER — HYDROCHLOROTHIAZIDE 12.5 MG PO CAPS
12.5000 mg | ORAL_CAPSULE | Freq: Every day | ORAL | Status: DC
  Administered 2013-02-23: 12.5 mg via ORAL
  Filled 2013-02-22: qty 1

## 2013-02-22 MED ORDER — ATORVASTATIN CALCIUM 80 MG PO TABS
80.0000 mg | ORAL_TABLET | Freq: Every day | ORAL | Status: DC
  Administered 2013-02-22: 80 mg via ORAL
  Filled 2013-02-22 (×2): qty 1

## 2013-02-22 MED ORDER — OMEGA-3 FATTY ACIDS 1000 MG PO CAPS: 2.0000 g | ORAL_CAPSULE | Freq: Every day | ORAL | Status: DC

## 2013-02-22 MED ORDER — ASPIRIN EC 81 MG PO TBEC
81.0000 mg | DELAYED_RELEASE_TABLET | Freq: Every day | ORAL | Status: DC
  Administered 2013-02-23: 81 mg via ORAL
  Filled 2013-02-22 (×2): qty 1

## 2013-02-22 MED ORDER — IPRATROPIUM-ALBUTEROL 18-103 MCG/ACT IN AERO
2.0000 | INHALATION_SPRAY | Freq: Four times a day (QID) | RESPIRATORY_TRACT | Status: DC | PRN
  Filled 2013-02-22: qty 14.7

## 2013-02-22 MED ORDER — METOPROLOL SUCCINATE ER 100 MG PO TB24
100.0000 mg | ORAL_TABLET | Freq: Two times a day (BID) | ORAL | Status: DC
  Administered 2013-02-22 – 2013-02-23 (×2): 100 mg via ORAL
  Filled 2013-02-22 (×3): qty 1

## 2013-02-22 MED ORDER — OMEGA-3-ACID ETHYL ESTERS 1 G PO CAPS
2.0000 g | ORAL_CAPSULE | Freq: Every day | ORAL | Status: DC
  Administered 2013-02-23: 2 g via ORAL
  Filled 2013-02-22: qty 2

## 2013-02-22 MED ORDER — BENAZEPRIL-HYDROCHLOROTHIAZIDE 20-12.5 MG PO TABS: 1.0000 | ORAL_TABLET | Freq: Every day | ORAL | Status: DC

## 2013-02-22 MED ORDER — SODIUM CHLORIDE 0.9 % IV SOLN: INTRAVENOUS | Status: DC

## 2013-02-22 MED ORDER — ASPIRIN 81 MG PO CHEW
CHEWABLE_TABLET | ORAL | Status: AC
  Filled 2013-02-22: qty 4

## 2013-02-22 MED ORDER — METHOTREXATE 2.5 MG PO TABS: 17.5000 mg | ORAL_TABLET | ORAL | Status: DC

## 2013-02-22 MED ORDER — HEPARIN SODIUM (PORCINE) 5000 UNIT/ML IJ SOLN
5000.0000 [IU] | Freq: Three times a day (TID) | INTRAMUSCULAR | Status: DC
  Administered 2013-02-22 – 2013-02-23 (×2): 5000 [IU] via SUBCUTANEOUS
  Filled 2013-02-22 (×5): qty 1

## 2013-02-22 MED ORDER — VITAMIN D3 25 MCG (1000 UNIT) PO TABS
1000.0000 [IU] | ORAL_TABLET | Freq: Every day | ORAL | Status: DC
  Administered 2013-02-23: 1000 [IU] via ORAL
  Filled 2013-02-22: qty 1

## 2013-02-22 MED ORDER — ACETAMINOPHEN 325 MG PO TABS: 650.0000 mg | ORAL_TABLET | Freq: Four times a day (QID) | ORAL | Status: DC | PRN

## 2013-02-22 MED ORDER — GABAPENTIN 100 MG PO CAPS
200.0000 mg | ORAL_CAPSULE | Freq: Three times a day (TID) | ORAL | Status: DC
  Administered 2013-02-22 – 2013-02-23 (×2): 200 mg via ORAL
  Filled 2013-02-22 (×4): qty 2

## 2013-02-22 NOTE — ED Notes (Signed)
Pt placed on cardiac monitor (pulse 80 bpm) and O2 2L by [place] pending transfer to [facility] ED.

## 2013-02-22 NOTE — ED Notes (Signed)
Pt reports pressure in chest with sob. denies chest pain. Numbness in left fingers. Symptoms started 3 days ago. Pt is trying a new med daliresp 500 mcg for asthma...  Hx of asthma and heart attack. Pt is sitting up right and speaking complete sentences no signs of acute distress.

## 2013-02-22 NOTE — ED Notes (Addendum)
Pt reports to the ED for eval of chest pressure. Pt transfer from [facility]. Pt reports chest pain since 8 am per [facility]. Pain radiates into the left arm and left arm numbness. Pt also reports N/V and SOB. Pt reports the N/V/D began last week. 12 lead showed t-wave inversion in V2 and V3. Pt had 324 of ASA prior to arrival. No nitro given. Pt A&O x 4. Pt placed on 2 L nasal cannula. Pt denies any CP or pressure at this time. Pt reports the tingling in her left hand is still present.

## 2013-02-22 NOTE — ED Provider Notes (Signed)
I saw and evaluated the patient, reviewed the resident's note and I agree with the findings and plan. I have reviewed EKG and agree with the resident interpretation.  you Pt with hx of MI with stent placement who presents today with N/V and chest tightness which is now improved.  Pt currently CP free and normal exam however given prior hx and sx today with t-wave inversion on EKG of unknown significance will admit for obs.  Gwyneth Sprout, MD 02/22/13 2052

## 2013-02-22 NOTE — ED Provider Notes (Signed)
History     CSN: [id number]  Arrival date & time 02/22/13  1250   First MD Initiated Contact with Patient 02/22/13 1338      Chief Complaint  Patient presents with  . Chest Pain    (Consider location/radiation/quality/duration/timing/severity/associated sxs/prior treatment) HPI Comments: [name] is a 79 year old female with PMH of MI 1999, HTN, and elevated cholesterol transferred from urgent care to [facility] for an episode of substernal chest pressure this morning. She explains that she has been having diarrhea off and on for the past two weeks and woke up this morning around 7 or 8am with diarrhea.  She then subsequently had vomiting of yellow material, no blood, and also developed substernal chest pressure and left hand numbness during that time and mild shortness of breath.  The chest pressure is localized to substernal region, non-radiating, feeling heavy, minimal pain just uncomfortable with no exacerbating factors.  Once her vomiting and diarrhea subsided, she went to urgent care and was still having the chest pressure and numbness.  She was given asprin and oxygen which helped resolved her pressure and shortness of breath.  Currently, she claims her chest pressure is almost gone but she still has numbness in her left fingers which is new.  She is not currently vomiting or has any nausea or diarrhea.  She does endorse intermittent occasional headaches.  She denies any fever, chills, recent weight loss, gait disturbance, vision disturbances, palpitations, lower extremity edema, swelling, or pain, abdominal pain, hematuria, hematemesis, melena, hematochezia, or any other urinary complaints at this time.    Of note, she and her husband spend lots of time on their RV and recently drove to [place] from [place] two weeks ago.  She does not have a cardiologist and does not take a daily aspirin.    Patient is a 79 y.o. female presenting with chest pain. The history is provided by the patient, the  spouse and a relative. No language interpreter was used.  Chest Pain Pain location:  Substernal area Pain quality: pressure   Pain radiates to:  Does not radiate Pain radiates to the back: no   Pain severity:  No pain Onset quality:  Sudden Timing:  Constant Progression:  Partially resolved Chronicity:  New Context: breathing and at rest   Relieved by:  Rest, oxygen and aspirin Worsened by:  Nothing tried Associated symptoms: headache, nausea, numbness, shortness of breath, vomiting and weakness   Associated symptoms: no abdominal pain, no altered mental status, no anxiety, no back pain, no cough, no diaphoresis, no dizziness, no fatigue, no fever, no lower extremity edema, no palpitations and no syncope   Headaches:    Severity:  Mild   Onset quality:  Gradual   Timing:  Sporadic   Progression:  Resolved   Chronicity:  Recurrent Nausea:    Severity:  Moderate   Onset quality:  Gradual   Timing:  Sporadic   Progression:  Resolved Shortness of breath:    Severity:  Mild   Onset quality:  Gradual   Timing:  Sporadic   Progression:  Resolved Vomiting:    Quality:  Stomach contents   Number of occurrences:  This morning   Severity:  Moderate   Timing:  Sporadic   Progression:  Resolved Weakness:    Severity:  Mild   Onset quality:  Gradual   Chronicity:  Chronic   Timing:  Constant   Progression:  Unchanged Risk factors: high cholesterol and hypertension   Risk factors: no aortic disease,  no birth control, no diabetes mellitus, not female, not obese, no prior DVT/PE and no smoking     Past Medical History  Diagnosis Date  . Asthma   . Hypertension   . Cancer   . Arthritis   . Osteoporosis     Past Surgical History  Procedure Laterality Date  . Abdominal hysterectomy    . Breast surgery    . Cardiac surgery    . Nm pet melanoma    . Carpal tunnel release    . Shoulder surgery      No family history on file.  History  Substance Use Topics  . Smoking  status: Never Smoker   . Smokeless tobacco: Not on file  . Alcohol Use: No    OB History   Grav Para Term Preterm Abortions TAB SAB Ect Mult Living                  Review of Systems  Constitutional: Negative for fever, diaphoresis and fatigue.  HENT: Negative for hearing loss, congestion and neck pain.   Eyes: Negative.  Negative for photophobia and visual disturbance.  Respiratory: Positive for shortness of breath. Negative for cough.   Cardiovascular: Positive for chest pain. Negative for palpitations, leg swelling and syncope.  Gastrointestinal: Positive for nausea, vomiting and diarrhea. Negative for abdominal pain, constipation and abdominal distention.  Endocrine: Negative.   Genitourinary: Negative.  Negative for hematuria and difficulty urinating.  Musculoskeletal: Negative.  Negative for back pain.  Skin: Negative.  Negative for rash.  Allergic/Immunologic: Negative.   Neurological: Positive for weakness, numbness and headaches. Negative for dizziness.  Hematological: Negative.   Psychiatric/Behavioral: Negative.  Negative for confusion and altered mental status.    Allergies  Celebrex  Home Medications   Current Outpatient Rx  Name  Route  Sig  Dispense  Refill  . albuterol-ipratropium (COMBIVENT) 18-103 MCG/ACT inhaler   Inhalation   Inhale 2 puffs into the lungs every 6 (six) hours as needed for wheezing.         . benazepril-hydrochlorthiazide (LOTENSIN HCT) 20-12.5 MG per tablet   Oral   Take 1 tablet by mouth daily.         . calcium carbonate (OS-CAL) 600 MG TABS   Oral   Take 600 mg by mouth 2 (two) times daily with a meal.         . Cholecalciferol (VITAMIN D PO)   Oral   Take 1 tablet by mouth daily.          . fish oil-omega-3 fatty acids 1000 MG capsule   Oral   Take 2 g by mouth daily.         . Flaxseed, Linseed, OIL   Oral   Take by mouth.         . fluticasone-salmeterol (ADVAIR HFA) 45-21 MCG/ACT inhaler   Inhalation    Inhale 2 puffs into the lungs 2 (two) times daily.         . folic acid (FOLVITE) 1 MG tablet   Oral   Take 1 mg by mouth daily.         Marland Kitchen gabapentin (NEURONTIN) 100 MG capsule   Oral   Take 200 mg by mouth 3 (three) times daily.          . Glucosamine HCl-Glucosamin SO4 200-300 MG TABS   Oral   Take 1 tablet by mouth daily.          . methotrexate (RHEUMATREX) 2.5 MG  tablet   Oral   Take 17.5 mg by mouth once a week. On Thursday Caution:Chemotherapy. Protect from light.         . metoprolol succinate (TOPROL-XL) 100 MG 24 hr tablet   Oral   Take 100 mg by mouth 2 (two) times daily. Take with or immediately following a meal.         . omeprazole (PRILOSEC) 20 MG capsule   Oral   Take 20 mg by mouth daily.         . rosuvastatin (CRESTOR) 5 MG tablet   Oral   Take 5 mg by mouth daily.           BP 119/56  Pulse 66  Temp(Src) 98 F (36.7 C) (Oral)  Resp 20  Ht 5' (1.524 m)  Wt 120 lb (54.432 kg)  BMI 23.44 kg/m2  SpO2 97%  Physical Exam  Constitutional: She is oriented to person, place, and time. She appears well-developed and well-nourished. No distress.  HENT:  Head: Normocephalic and atraumatic.  Eyes: Conjunctivae and EOM are normal. Pupils are equal, round, and reactive to light.  Neck: Normal range of motion. Neck supple.  Cardiovascular: Normal rate, regular rhythm, normal heart sounds and intact distal pulses.   Pulmonary/Chest: Effort normal and breath sounds normal. No respiratory distress. She exhibits no tenderness.  Abdominal: Soft. Bowel sounds are normal. She exhibits no distension. There is no tenderness.  Musculoskeletal: Normal range of motion. She exhibits no edema and no tenderness.  Neurological: She is alert and oriented to person, place, and time.  Strength: 4/5 b/l upper extremities and 5/5 b/l lower extremities  Decreased sensation with numbness in left hand fingertips  Skin: Skin is warm and dry. No rash noted. She is  not diaphoretic.  Psychiatric: She has a normal mood and affect. Her behavior is normal. Judgment and thought content normal.    ED Course  Procedures (including critical care time)  Labs Reviewed  CBC - Abnormal; Notable for the following:    HCT 35.1 (*)    All other components within normal limits  COMPREHENSIVE METABOLIC PANEL - Abnormal; Notable for the following:    GFR calc non Af Amer 80 (*)    All other components within normal limits  URINALYSIS, ROUTINE W REFLEX MICROSCOPIC - Abnormal; Notable for the following:    APPearance CLOUDY (*)    Ketones, ur 15 (*)    Leukocytes, UA MODERATE (*)    All other components within normal limits  URINE MICROSCOPIC-ADD ON - Abnormal; Notable for the following:    Bacteria, UA FEW (*)    All other components within normal limits  POCT I-STAT, CHEM 8 - Abnormal; Notable for the following:    Hemoglobin 11.9 (*)    HCT 35.0 (*)    All other components within normal limits  URINE CULTURE  PROTIME-INR  POCT I-STAT TROPONIN I   Dg Chest 2 View  02/22/2013   *RADIOLOGY REPORT*  Clinical Data: Chest heaviness and pressure.  CHEST - 2 VIEW  Comparison: CT chest 04/11/2006 and PA and lateral chest 08/25/2008.  Findings: The lungs are emphysematous but clear.  Heart size is upper normal.  No pneumothorax or pleural fluid.  Convex left thoracolumbar scoliosis is noted.  IMPRESSION: Emphysema without acute disease.   Original Report Authenticated By: Holley Dexter, M.D.     No diagnosis found.   Date: 02/22/2013  Rate: 70bpm  Rhythm: normal sinus rhythm  QRS Axis: normal  Intervals:  normal  ST/T Wave abnormalities: nonspecific T wave changes; t wave inversions aVL, v1, v2 and flattening of t waves in v3  Conduction Disutrbances:none  Narrative Interpretation: 70bpm, NSR, nonspecific t wave changes with poor r wave progresion  Old EKG Reviewed: none available  MDM  Ms. Francesconi is a 79 year old female with PMH of prior MI, HTN, HL,  asthma, and melanoma presenting with acute onset chest pressure this morning and left hand numbness.  Complains of intermittent diarrhea x2 weeks, with episode of diarrhea and vomiting this morning along with chest pressure and shortness of breath.  Chest pressure almost completely resolved at urgent care after receiving Aspirin and Oxygen.  Non-specific t wave changes on EKG, initial troponin negative.  CXR showing emphysema without acute disease.  Traveled to [place] from [place] 2 weeks prior.  Given PMH of previous MI and HTN and current symptoms, concern for ACS and will admit for further work up.  Low suspicion for PE, no tachycardia, no lower extremity swelling or pain, but did travel 2 weeks ago and is on RV often.  Case discussed with Dr. Clyde Lundborg from [facility] who will admit patient.    Case discussed and patient seen with Dr. Anitra Lauth who agrees with assessment and plan.     Signed: Darden Palmer, MD PGY-I, Internal Medicine Resident Pager: [phone]  02/22/2013,3:47 PM           Darden Palmer, MD 02/22/13 [phone]

## 2013-02-22 NOTE — ED Provider Notes (Signed)
Chief Complaint:   Chief Complaint  Patient presents with  . Chest Pain    pressure in chest with sob x three days.  nausea and diarrhea.     History of Present Illness:   [name] is a 79 year old female with a history of a previous heart attack, hypertension, elevated cholesterol, and a family history of heart disease who presents today with an episode of substernal chest pressure. Patient states it felt like a weight on her chest. This began at 8 AM and lasted about 2 hours. It's come away completely right now. There was no radiation into the arm or the neck, but she did have some numbness and tingling in the fingers of her left hand. This was associated with nausea and vomiting and increasing shortness of breath. The patient denies any fever, chills, coughing, wheezing, palpitations, or syncope. She's had no abdominal pain. She has had some diarrhea for about 3-4 days she denies any blood in the stool or melena. She denies any abdominal pain.  Review of Systems:  Other than noted above, the patient denies any of the following symptoms. Systemic:  No fever, chills, sweats, or fatigue. ENT:  No nasal congestion, rhinorrhea, or sore throat. Pulmonary:  No cough, wheezing, shortness of breath, sputum production, hemoptysis. Cardiac:  No palpitations, rapid heartbeat, dizziness, presyncope or syncope. GI:  No abdominal pain, heartburn, nausea, or vomiting. Ext:  No leg pain or swelling.  PMFSH:  Past medical history, family history, social history, meds, and allergies were reviewed and updated as needed. She has asthma, hypertension, rheumatoid arthritis, elevated cholesterol, and she had a myocardial infarction several years ago. She is an ex-smoker she is allergic to Celebrex. She takes number of medications including Lotensin/HCTZ, Advair, folic acid, Neurontin, methotrexate, metoprolol, omeprazole, Crestor, Combivent, and Daliresp.  Physical Exam:   Vital signs:  BP 140/74  Pulse 78   Temp(Src) 97.8 F (36.6 C) (Oral)  Resp 19  SpO2 98% Gen:  Alert, oriented, in no distress, skin warm and dry. Eye:  PERRL, lids and conjunctivas normal.  Sclera non-icteric. ENT:  Mucous membranes moist, pharynx clear. Neck:  Supple, no adenopathy or tenderness.  No JVD. Lungs:  Clear to auscultation, no wheezes, rales or rhonchi.  No respiratory distress. Heart:  Regular rhythm.  No gallops, murmers, clicks or rubs. Chest:  No chest wall tenderness. Abdomen:  Soft, nontender, no organomegaly or mass.  Bowel sounds normal.  No pulsatile abdominal mass or bruit. Ext:  No edema.  No calf tenderness and Homann's sign negative.  Pulses full and equal. Skin:  Warm and dry.  No rash.   EKG:   Date: 02/22/2013  Rate: 79  Rhythm: normal sinus rhythm and premature ventricular contractions (PVC)  QRS Axis: normal  Intervals: normal  ST/T Wave abnormalities: nonspecific T wave changes  Conduction Disutrbances:none  Narrative Interpretation: Sinus rhythm with frequent premature ventricular complexes, ST and T wave abnormalities with inverted T waves in leads V1, V2, and V3 consistent with anterior ischemia.  Old EKG Reviewed: none available  Course in Urgent Care Center:   Was started on IV normal saline at 50 mL per hour and given aspirin 325 mg by mouth. Since she has no chest pain or discomfort right now she was not given nitroglycerin.  Assessment:  The encounter diagnosis was Angina pectoris.   Plan:   1.  The following meds were prescribed:   New Prescriptions   No medications on file   2.  The  patient was transported to the emergency department via [facility] in stable condition.  Medical Decision Making:  79 year old female with Hx of MI, HT, hypercholesterolemia, and pos FH had a 2 hour episode of chest pressure today at 8 a.m.  Associated with nausea, vomiting, shortness of breath and tingling of left fingers.  No pain now.  EKG shows T inversions in precordial leads.  We have  started NS and will give ASA.  No TNG given since she's not in pain.  Will transfer via [facility].        Reuben Likes, MD 02/22/13 1230

## 2013-02-22 NOTE — ED Notes (Signed)
Pt being transferred to ED. Report Called to [facility] and [facility]iation protection practitioner. Mw,cma

## 2013-02-22 NOTE — H&P (Signed)
Hospital Admission Note Date: 02/22/2013  Patient name: [name] Medical record number: [id number] Date of birth: 1932-10-19 Age: 79 y.o. Gender: female PCP: Myles Rosenthal   Service:  Internal Medicine Teaching Service   Chief Complaint:  Chest pressure    History of Present Illness:  79 year old woman with hypertension and rheumatoid arthritis with a history of melanoma and myocardial infarction, presenting to the emergency department with chest pressure. Onset was at 8:00 this morning. Patient was resting comfortably in a chair and was not experiencing stress or anxiety at that time. Quality described as "pressure". Location was mid chest. No radiation. Severity rated at 5-6/10. Pain was constant since onset but resolved a short time ago. No alleviating or aggravating factors identified by the patient. Associated with tingling in the left fingertips but no weakness.  It was also associated with nausea and vomiting this morning. It was not associated with diaphoresis, dyspnea, headache, or dizziness.   On review of systems, the patient has been having diarrhea and nausea for about 3 weeks. Diarrhea described as 3-4 bowel movements a day. Bowel movements are well-formed and not watery.     Review of Systems:   Constitutional: Negative for fever, chills and diaphoresis.  HENT: Negative for hearing loss, congestion, sore throat and tinnitus.   Eyes: Negative for blurred vision and double vision.  Respiratory: Negative for cough, sputum production and shortness of breath.   Cardiovascular: Positive for chest pain.  Gastrointestinal: Positive for nausea, vomiting and diarrhea. Negative for abdominal pain, constipation and blood in stool.  Genitourinary: Negative for dysuria, urgency, frequency and hematuria.  Musculoskeletal: Positive for joint pain (chronic, RA).  Skin: Negative for rash.  Neurological: Positive for tingling. Negative for dizziness, focal weakness and headaches.      Medical History: Past Medical History  Diagnosis Date  . Asthma   . Hypertension   . Melanoma     scalp (2007) & leg (2013)  . Rheumatoid arthritis   . Osteoporosis   . Myocardial infarction 1999    no PCI or CABG    Surgical History: Past Surgical History  Procedure Laterality Date  . Abdominal hysterectomy    . Breast surgery    . Cardiac surgery    . Melanoma excision  2007    scalp  . Carpal tunnel release    . Shoulder surgery    . Melanoma excision Right 2013    lower leg    Home Medications: Current Outpatient Rx  Name  Route  Sig  Dispense  Refill  . albuterol-ipratropium (COMBIVENT) 18-103 MCG/ACT inhaler   Inhalation   Inhale 2 puffs into the lungs every 6 (six) hours as needed for wheezing.         . benazepril-hydrochlorthiazide (LOTENSIN HCT) 20-12.5 MG per tablet   Oral   Take 1 tablet by mouth daily.         . calcium carbonate (OS-CAL) 600 MG TABS   Oral   Take 600 mg by mouth 2 (two) times daily with a meal.         . Cholecalciferol (VITAMIN D PO)   Oral   Take 1 tablet by mouth daily.          . fish oil-omega-3 fatty acids 1000 MG capsule   Oral   Take 2 g by mouth daily.         . Flaxseed, Linseed, OIL   Oral   Take by mouth.         Marland Kitchen  fluticasone-salmeterol (ADVAIR HFA) 45-21 MCG/ACT inhaler   Inhalation   Inhale 2 puffs into the lungs 2 (two) times daily.         . folic acid (FOLVITE) 1 MG tablet   Oral   Take 1 mg by mouth daily.         Marland Kitchen gabapentin (NEURONTIN) 100 MG capsule   Oral   Take 200 mg by mouth 3 (three) times daily.          . Glucosamine HCl-Glucosamin SO4 200-300 MG TABS   Oral   Take 1 tablet by mouth daily.          . methotrexate (RHEUMATREX) 2.5 MG tablet   Oral   Take 17.5 mg by mouth once a week. On Thursday Caution:Chemotherapy. Protect from light.         . metoprolol succinate (TOPROL-XL) 100 MG 24 hr tablet   Oral   Take 100 mg by mouth 2 (two) times daily. Take  with or immediately following a meal.         . omeprazole (PRILOSEC) 20 MG capsule   Oral   Take 20 mg by mouth daily.         . rosuvastatin (CRESTOR) 5 MG tablet   Oral   Take 5 mg by mouth daily.           Allergies: Allergies as of 02/22/2013 - Review Complete 02/22/2013  Allergen Reaction Noted  . Celebrex (celecoxib) Other (See Comments) 02/22/2013    Family History: No family history on file.   Social History: Social History  . Marital Status: Married    Spouse Name: N/A    Number of Children: N/A  . Years of Education: N/A   Social History Main Topics  . Smoking status: Former Games developer  . Smokeless tobacco: Never Used  . Alcohol Use: No  . Drug Use: No  . Sexually Active: No   Social History Narrative   Lives in [place].  Originally from [place].  Children live in [place].    Physical exam:  VITALS: BP 133/70, HR 68, RR 26, temp 66F, SpO2 97% on room air GENERAL: well developed, well nourished; no acute distress HEAD: atraumatic, normocephalic EYES: pupils equal, round and reactive; sclera anicteric; normal conjunctiva EARS: canals patent and TMs normal bilaterally NOSE/THROAT: oropharynx clear, moist mucous membranes, pink gums, normal dentition NECK: supple, no carotid bruits, thyroid normal in size and without palpable nodules LYMPH: no cervical or supraclavicular lymphadenopathy LUNGS: clear to auscultation bilaterally, normal work of breathing HEART: normal rate and regular rhythm; normal S1 and S2 without S3 or S4; no murmurs, rubs, or clicks PULSES: radial 2+ and symmetric, dorsalis pedis and posterior tibial pulses are 2+ on the right and 1+ on the left ABDOMEN: soft, non-tender, normal bowel sounds, no masses or organomegaly MSK: no tenderness in the calves with palpation MOTOR: 5/5 grip strength, finger abduction, arm flexion, arm extension, hip flexion, dorsiflexion, plantarflexion SENSATION: intact; sensation to pin-prick and  soft-touch was specifically tested in the arms and hands in the C5-T1 dermatomes and there were no deficiencies or asymmetries with testing; vibratory sensation was tested on the interphalangeal joint of the first finger bilaterally with no deficiency or asymmetry  REFLEXES: 1+ patellar reflexes bilaterally, 2+ biceps reflexes bilaterally, no asymmetry  CRANIAL NERVES:  no deficiencies; pupils reactive to light bilaterally; extra occular muscles are intact; facial sensation is intact and equal bilaterally in V1, V2, and V3; forehead wrinkles symmetrically, orbicularis oculi strength is  normal and equal bilaterally, smile is symmetric, cheeks puff out equally without air excursion, and depressor anguli oris function is intact bilaterally; hearing is equal bilaterally, tested with a tuning fork; uvula is midline and palate elevates symmetrically; trapezius and sternocleidomastoid strength is normal and equal bilaterally; tongue protrudes midline SKIN: warm, dry, intact, normal turgor, no rashes, scattered ecchymoses on all 4 limbs  EXTREMITIES: no peripheral edema, clubbing, or cyanosis PSYCH: patient is alert and oriented, mood and affect are normal and congruent, thought content is normal without delusions, thought process is linear, speech is normal and non-pressured, behavior is normal      Lab results: Basic Metabolic Panel:  16/10/96 1407 02/22/13 1420  NA 137 138  K 3.7 3.9  CL 105 101  CO2  --  27  GLUCOSE 92 91  BUN 17 18  CREATININE 1.00 0.73  CALCIUM  --  10.0    Liver Function Tests:  02/22/13 1420  AST 24  ALT 17  ALKPHOS 70  BILITOT 0.5  PROT 7.0  ALBUMIN 3.9   CBC:  02/22/13 1407 02/22/13 1420  WBC  --  7.1  HGB 11.9* 12.2  HCT 35.0* 35.1*  MCV  --  88.9  PLT  --  324   Cardiac Enzymes:  02/22/13 1405 02/22/13 1641  TROP  -- <0.30  POCTROP 0.02  --    Coagulation:  02/22/13 1349  PT 13.1  INR 1.00    Urinalysis:  02/22/13 1324  COLOR YELLOW   SG 1.014  PH 6.5  GLUC NEGATIVE  HGB NEGATIVE  BILI NEGATIVE  KETONES 15*  PROTEIN NEGATIVE  UROBILI 0.2  NITRITE NEGATIVE  LE MODERATE*  WBC 3-6  RBC 0-2  SQUAMS RARE  BACTERIA FEW*    Imaging results: Dg Chest 2 View 02/22/2013   FINDINGS: The lungs are emphysematous but clear.  Heart size is upper normal.  No pneumothorax or pleural fluid.  Convex left thoracolumbar scoliosis is noted.  IMPRESSION: Emphysema without acute disease.    Other results: EKG Results:  02/22/2013 Rate:  70 PR:  168 QRS:  92 QTc:  432 Axis:  Normal Comparison: None EKG: Normal sinus rhythm. Nonspecific T wave inversion in V2 and aVL.  Insignificant Q waves in V6.  Borderline Q wave in III. No evidence of acute ischemia.    Assessment and Plan:  1.   Nonanginal chest pain:  Of 3 classic characteristics of anginal chest pain, this patient describes only one: Substernal location. The pain was nonexertional and was not relieved by rest or nitroglycerin. TIMI score is 1.  She does, however, have coronary artery disease.  We will measure serial troponin levels overnight.  Esophageal pathology is an alternative consideration.  Aspirin was administered at urgent care earlier today. - Serial troponins - AM EKG - Admit to telemetry - Start aspirin 81 mg daily - Continue atorvastatin 80 mg QD - Continue metoprolol and benazepril  2.   Left finger paresthesias:  Most likely a referred sensation from what ever caused her chest discomfort.  There are no demonstrable neurological deficits on exam.  Pin-prick, soft-touch, and vibratory sensation was tested in her fingers and arms and there were no deficits or asymmetries.  This makes a central, neurological process unlikely.  No further work-up needed at this time.  3.   N/V/D:  Three weeks of increased bowel movements and nausea may be a side-effect of methotrexate therapy.  Gastroenteritis usually produces a watery diarrhea rather than just increased  frequency.  The nausea and vomiting the patient experienced this morning are likely a sequela of the chest pain process noted above.  No electrolyte derangements and no evidence of volume depletion. - Magnesium level  4.   Asymptomatic bacteruria:  Moderate LE on urinalysis.  Few bacteria and 3-6 WBCs on urine microscopy.  Urine culture pending.  No symptoms.  Afebrile and without leukocytosis.  No treatment is indicated for asymptomatic bacteruria in non-pregnant adults. This is not a UTI.  5.   Coronary artery disease:  Patient has a history of MI but no history of PCI or CABG.  nuclear medicine stress test from 01/09/2005 showed mildly reduced activity in the inferior wall but no inducible ischemia. At home, she was taking a high-intensity statin, ACE inhibitor, and a beta blocker. She was not on aspirin. We have started aspirin and are measuring serial troponin levels, as noted above. She will need stress testing soon, either here or as an outpatient.  6.   Hypertension:  Her home regimen included benazepril-hydrochlorothiazide 20-12.5 daily and metoprolol succinate 100 mg twice a day.her blood pressures are low-normal here. We will continue her home regimen. - Continue benazepril-hydrochlorothiazide 20-12.5 mg daily - Continue metoprolol succinate 100 mg twice a day   7.   Prophylaxis:  Heparin 5,000U [place] BID  8.   Disposition:  Patient is from out of town. She will need followup with her primary care physician in [place]. She is to stay in the area for more than a week, she should followup with [place] once in the [facility].    Signed by:  Dorthula Rue. Earlene Plater, MD PGY-I, Internal Medicine  02/22/2013, 7:26 PM

## 2013-02-22 NOTE — Progress Notes (Signed)
PHARMACIST - PHYSICIAN ORDER COMMUNICATION  CONCERNING: P&T Medication Policy on Herbal Medications  DESCRIPTION:  This patient's order for:  Glucosamine has been noted.  This product(s) is classified as an "herbal" or natural product. Due to a lack of definitive safety studies or FDA approval, nonstandard manufacturing practices, plus the potential risk of unknown drug-drug interactions while on inpatient medications, the Pharmacy and Therapeutics Committee does not permit the use of "herbal" or natural products of this type within [facility].   ACTION TAKEN: The pharmacy department is unable to verify this order at this time and your patient has been informed of this safety policy. Please reevaluate patient's clinical condition at discharge and address if the herbal or natural product(s) should be resumed at that time.  Harland German, Pharm D 02/22/2013 6:10 PM

## 2013-02-23 DIAGNOSIS — I2510 Atherosclerotic heart disease of native coronary artery without angina pectoris: Secondary | ICD-10-CM

## 2013-02-23 LAB — URINE CULTURE

## 2013-02-23 LAB — CLOSTRIDIUM DIFFICILE BY PCR: Toxigenic C. Difficile by PCR: NEGATIVE

## 2013-02-23 MED ORDER — CALCIUM CARBONATE 1250 (500 CA) MG PO TABS
1.0000 | ORAL_TABLET | Freq: Two times a day (BID) | ORAL | Status: DC
  Administered 2013-02-23: 500 mg via ORAL
  Filled 2013-02-23 (×3): qty 1

## 2013-02-23 MED ORDER — NITROGLYCERIN 0.4 MG SL SUBL: 0.4000 mg | SUBLINGUAL_TABLET | SUBLINGUAL | Status: AC | PRN

## 2013-02-23 MED ORDER — ASPIRIN 81 MG PO TBEC: 81.0000 mg | DELAYED_RELEASE_TABLET | Freq: Every day | ORAL | Status: DC

## 2013-02-23 NOTE — Progress Notes (Signed)
Subjective: Patient states that she is doing well this morning. Her last episode of chest pressure was last night around 10 PM. She states that this did not get any better or worse with any medicines, it just went away on its and. It lasted about 20 minutes. Not described as pain, just some vague pressure in the center of her chest. Associated diaphoresis, nausea, shortness of breath.  She continues to have paresthesias in her left hand.  troponins negative overnight  Objective: Vital signs in last 24 hours: Filed Vitals:   02/22/13 1700 02/22/13 1803 02/22/13 2052 02/23/13 0540  BP: 115/54 131/79 117/73 108/70  Pulse: 69 64 79 64  Temp:  98 F (36.7 C) 98.6 F (37 C) 98.3 F (36.8 C)  TempSrc:  Oral  Oral  Resp:  18  17  Height:  5' (1.524 m)    Weight:  123 lb 6.4 oz (55.974 kg)  124 lb 12.5 oz (56.6 kg)  SpO2: 97% 95% 97% 93%   Weight change:  No intake or output data in the 24 hours ending 02/23/13 0727  Physical Exam Blood pressure 108/70, pulse 64, temperature 98.3 F (36.8 C), temperature source Oral, resp. rate 17, height 5' (1.524 m), weight 124 lb 12.5 oz (56.6 kg), SpO2 93.00%. General:  No acute distress, alert and oriented x 3, well-appearing CF HEENT:  PERRL, EOMI, moist mucous membranes Cardiovascular:  Regular rate and rhythm, no murmurs, rubs or gallops Respiratory:  Clear to auscultation bilaterally, no wheezes, rales, or rhonchi Abdomen:  Soft, nondistended, nontender, bowel sounds present Extremities:  Warm and well-perfused, trace edema.  Skin: Warm, dry, no rashes Neuro: Not anxious appearing, no depressed mood, normal affect  Lab Results: Basic Metabolic Panel:  Recent Labs Lab 02/22/13 1407 02/22/13 1420 02/22/13 2321  NA 137 138  --   K 3.7 3.9  --   CL 105 101  --   CO2  --  27  --   GLUCOSE 92 91  --   BUN 17 18  --   CREATININE 1.00 0.73  --   CALCIUM  --  10.0  --   MG  --   --  1.8   Liver Function Tests:  Recent Labs Lab  02/22/13 1420  AST 24  ALT 17  ALKPHOS 70  BILITOT 0.5  PROT 7.0  ALBUMIN 3.9   CBC:  Recent Labs Lab 02/22/13 1407 02/22/13 1420  WBC  --  7.1  HGB 11.9* 12.2  HCT 35.0* 35.1*  MCV  --  88.9  PLT  --  324   Cardiac Enzymes:  Recent Labs Lab 02/22/13 1641 02/22/13 2321 02/23/13 0435  TROPONINI <0.30 <0.30 <0.30   Coagulation:  Recent Labs Lab 02/22/13 1349  LABPROT 13.1  INR 1.00   Urinalysis:  Recent Labs Lab 02/22/13 1324  COLORURINE YELLOW  LABSPEC 1.014  PHURINE 6.5  GLUCOSEU NEGATIVE  HGBUR NEGATIVE  BILIRUBINUR NEGATIVE  KETONESUR 15*  PROTEINUR NEGATIVE  UROBILINOGEN 0.2  NITRITE NEGATIVE  LEUKOCYTESUR MODERATE*   Studies/Results: Dg Chest 2 View  02/22/2013   *RADIOLOGY REPORT*  Clinical Data: Chest heaviness and pressure.  CHEST - 2 VIEW  Comparison: CT chest 04/11/2006 and PA and lateral chest 08/25/2008.  Findings: The lungs are emphysematous but clear.  Heart size is upper normal.  No pneumothorax or pleural fluid.  Convex left thoracolumbar scoliosis is noted.  IMPRESSION: Emphysema without acute disease.   Original Report Authenticated By: Holley Dexter, M.D.   Medications:  Medications reviewed  Scheduled Meds: . aspirin EC  81 mg Oral Daily  . atorvastatin  80 mg Oral q1800  . benazepril  20 mg Oral Daily  . calcium carbonate  600 mg Oral BID WC  . cholecalciferol  1,000 Units Oral Daily  . folic acid  1 mg Oral Daily  . gabapentin  200 mg Oral TID  . heparin  5,000 Units Subcutaneous Q8H  . hydrochlorothiazide  12.5 mg Oral Daily  . [START ON 02/26/2013] methotrexate  17.5 mg Oral Q Thu  . metoprolol succinate  100 mg Oral BID  . mometasone-formoterol  2 puff Inhalation BID  . omega-3 acid ethyl esters  2 g Oral Daily  . pantoprazole  40 mg Oral Daily  . sodium chloride  3 mL Intravenous Q12H   Continuous Infusions:  PRN Meds:.sodium chloride, acetaminophen, acetaminophen, albuterol-ipratropium, ibuprofen, morphine  injection, nitroGLYCERIN, sodium chloride  Assessment/Plan:  1. Nonanginal chest pain: Of 3 classic characteristics of anginal chest pain, this patient describes only one: Substernal location. The pain was nonexertional and was not relieved by rest or nitroglycerin. TIMI score is 1. She does, however, have coronary artery disease. We will measure serial troponin levels overnight. Esophageal pathology is an alternative consideration. Aspirin was administered at urgent care earlier today.  6/16: troponins negative overnight, low risk scores, chest pain resolved. Patient can get further work up as an outpatient and she prefers to be worked up in [place].  -fax records to PCP -will arrange for outpatient cardiology evaluation and possible stress testing -continue daily aspirin - Continue atorvastatin, metoprolol, benazepril, PPI  2. Left finger paresthesias:  Unclear etiology, no neurologic deficits. Outpatient work up per PCP.  3. N/V/Diarrhea 6/16: history seems like exaggerated gastro-colic reflex. Patient denies diarrhea, but rather immediate stooling whenever she eats. Cdiff negative, doubt infectious causes given time course, but patient is immunocompromised because of being on MTX therapy -reviewed red flag symptoms with patient which would warrant urgent evaluation, patient can f/u with PCP   4. Asymptomatic bacteruria: Moderate LE on urinalysis. Few bacteria and 3-6 WBCs on urine microscopy. Urine culture pending. No symptoms. Afebrile and without leukocytosis. No treatment is indicated for asymptomatic bacteruria in non-pregnant adults. This is not a UTI.   5. Coronary artery disease: Patient has a history of MI but no history of PCI or CABG. nuclear medicine stress test from 01/09/2005 showed mildly reduced activity in the inferior wall but no inducible ischemia. At home, she was taking a high-intensity statin, ACE inhibitor, and a beta blocker. She was not on aspirin. We have started  aspirin. She will need stress testing as an outpatient, as discussed above.   Hypertension: Her home regimen included benazepril-hydrochlorothiazide 20-12.5 daily and metoprolol succinate 100 mg twice a day.her blood pressures are low-normal here. We will continue her home regimen.  - Continue benazepril-hydrochlorothiazide 20-12.5 mg daily  - Continue metoprolol succinate 100 mg twice a day   Rheumatoid Arthritis Patient on home MTX therapy. -cont MTX   Prophylaxis: Heparin 5,000U  BID    Disposition: Patient is from out of town. She will need followup with her primary care physician in [place].    LOS: 1 day   [place] Ar 02/23/2013, 7:27 AM

## 2013-02-23 NOTE — Progress Notes (Signed)
Discharge instructions given, no questions asked.  Left via wheelchair with husband. [name]

## 2013-02-23 NOTE — Discharge Summary (Signed)
Internal Medicine Teaching [facility] Discharge Note  Name: [name] MRN: [id number] DOB: Jan 21, 1933 79 y.o.  Date of Admission: 02/22/2013 12:59 PM Date of Discharge: 02/23/2013 Attending Physician: Dr. Kem Kays  Discharge Diagnosis: Principal Problem:   Chest pain Active Problems:   HYPERLIPIDEMIA   HYPERTENSION   ASTHMA   COPD   GERD   Rheumatoid arthritis   Diarrhea   Discharge Medications:   Medication List    TAKE these medications       ADVAIR HFA 45-21 MCG/ACT inhaler  Generic drug:  fluticasone-salmeterol  Inhale 2 puffs into the lungs 2 (two) times daily.     albuterol-ipratropium 18-103 MCG/ACT inhaler  Commonly known as:  COMBIVENT  Inhale 2 puffs into the lungs every 6 (six) hours as needed for wheezing.     aspirin 81 MG EC tablet  Take 1 tablet (81 mg total) by mouth daily.     benazepril-hydrochlorthiazide 20-12.5 MG per tablet  Commonly known as:  LOTENSIN HCT  Take 1 tablet by mouth daily.     calcium carbonate 600 MG Tabs  Commonly known as:  OS-CAL  Take 600 mg by mouth 2 (two) times daily with a meal.     fish oil-omega-3 fatty acids 1000 MG capsule  Take 2 g by mouth daily.     Flaxseed (Linseed) Oil  Take by mouth.     folic acid 1 MG tablet  Commonly known as:  FOLVITE  Take 1 mg by mouth daily.     gabapentin 100 MG capsule  Commonly known as:  NEURONTIN  Take 200 mg by mouth 3 (three) times daily.     Glucosamine HCl-Glucosamin SO4 200-300 MG Tabs  Take 1 tablet by mouth daily.     methotrexate 2.5 MG tablet  Commonly known as:  RHEUMATREX  Take 17.5 mg by mouth once a week. On Thursday  Caution:Chemotherapy. Protect from light.     metoprolol succinate 100 MG 24 hr tablet  Commonly known as:  TOPROL-XL  Take 100 mg by mouth 2 (two) times daily. Take with or immediately following a meal.     nitroGLYCERIN 0.4 MG SL tablet  Commonly known as:  NITROSTAT  Place 1 tablet (0.4 mg total) under the tongue every 5  (five) minutes as needed for chest pain.     omeprazole 20 MG capsule  Commonly known as:  PRILOSEC  Take 20 mg by mouth daily.     rosuvastatin 5 MG tablet  Commonly known as:  CRESTOR  Take 5 mg by mouth daily.     VITAMIN D PO  Take 1 tablet by mouth daily.        Disposition and follow-up:   Ms.[name] was discharged from [facility] in Stable condition.  At the hospital follow up visit please address the following:  -patient to follow up with her PCP Dr. Dillard Cannon  -PCP to arrange outpatient to cardiology evaluation and stress test -fax discharge summary to PCP -continue daily aspirin  Follow-up Appointments: Follow-up Information   Please follow up. (patient will call her PCP for follow up in one week. )      Discharge Orders   Future Orders Complete By Expires     Call MD for:  extreme fatigue  As directed     Call MD for:  severe uncontrolled pain  As directed     Diet - low sodium heart healthy  As directed     Increase activity  slowly  As directed        Consultations:  None  Procedures Performed:  Dg Chest 2 View  02/22/2013   *RADIOLOGY REPORT*  Clinical Data: Chest heaviness and pressure.  CHEST - 2 VIEW  Comparison: CT chest 04/11/2006 and PA and lateral chest 08/25/2008.  Findings: The lungs are emphysematous but clear.  Heart size is upper normal.  No pneumothorax or pleural fluid.  Convex left thoracolumbar scoliosis is noted.  IMPRESSION: Emphysema without acute disease.   Original Report Authenticated By: Holley Dexter, M.D.    Admission HPI:  79 year old woman with hypertension and rheumatoid arthritis with a history of melanoma and myocardial infarction, presenting to the emergency department with chest pressure. Onset was at 8:00 this morning. Patient was resting comfortably in a chair and was not experiencing stress or anxiety at that time. Quality described as "pressure". Location was mid chest. No radiation. Severity  rated at 5-6/10. Pain was constant since onset but resolved a short time ago. No alleviating or aggravating factors identified by the patient. Associated with tingling in the left fingertips but no weakness. It was also associated with nausea and vomiting this morning. It was not associated with diaphoresis, dyspnea, headache, or dizziness.  On review of systems, the patient has been having diarrhea and nausea for about 3 weeks. Diarrhea described as 3-4 bowel movements a day. Bowel movements are well-formed and not watery.    Hospital Course by problem list: Principal Problem:   Chest pain Active Problems:   HYPERLIPIDEMIA   HYPERTENSION   ASTHMA   COPD   GERD   Rheumatoid arthritis   Diarrhea   Chest Pressure Patient admitted for chest pressure associated with left fingertip tingling. POC troponins from the ED were negative, CXR showed only some possible emphysema, EKG with T wave inversions in lateral leads but without any ST changes.  TIMI score of 1. Given aspirin, placed on telemetry, and monitored overnight. She did not have any further episodes of chest pain or chest pressure overnight. Patient's troponins were cycled overnight and negative. After discussion with the patient and her husband, patient elected to undergo stress testing as an outpatient, rather than stay another day in the hospital to undergo inpatient stress test. We discussed at length that if she has any further chest discomfort or pressure, shortness of breath, chest pain on exertion, or other red flag symptoms, she is to go to the nearest ED for further evaluation. She agreed to this. Patient will call and get an appointment with her PCP within the next 2 weeks to arrange outpatient stress test for ischemic workup. She should continue taking a daily aspirin discharge.  Excessive Stooling Patient also reported 3 weeks of frequent bowel movements immediately after eating. Stool is formed, not watery, usually 3-4 times  per day. This only happens right after eating. Electrolytes were normal. C. difficile was tested and was negative. This was thought to be an exaggerated gastrocolic reflex versus side effects of methotrexate therapy. This did not warrant further inpatient workup, she can followup with her PCP.   Discharge Vitals:  BP 108/70  Pulse 64  Temp(Src) 98.3 F (36.8 C) (Oral)  Resp 17  Ht 5' (1.524 m)  Wt 124 lb 12.5 oz (56.6 kg)  BMI 24.37 kg/m2  SpO2 93%  Discharge Labs:  Results for orders placed during the hospital encounter of 02/22/13 (from the past 24 hour(s))  URINALYSIS, ROUTINE W REFLEX MICROSCOPIC     Status: Abnormal   Collection  Time    02/22/13  1:24 PM      Result Value Range   Color, Urine YELLOW  YELLOW   APPearance CLOUDY (*) CLEAR   Specific Gravity, Urine 1.014  1.005 - 1.030   pH 6.5  5.0 - 8.0   Glucose, UA NEGATIVE  NEGATIVE mg/dL   Hgb urine dipstick NEGATIVE  NEGATIVE   Bilirubin Urine NEGATIVE  NEGATIVE   Ketones, ur 15 (*) NEGATIVE mg/dL   Protein, ur NEGATIVE  NEGATIVE mg/dL   Urobilinogen, UA 0.2  0.0 - 1.0 mg/dL   Nitrite NEGATIVE  NEGATIVE   Leukocytes, UA MODERATE (*) NEGATIVE  URINE MICROSCOPIC-ADD ON     Status: Abnormal   Collection Time    02/22/13  1:24 PM      Result Value Range   Squamous Epithelial / LPF RARE  RARE   WBC, UA 3-6  <3 WBC/hpf   RBC / HPF 0-2  <3 RBC/hpf   Bacteria, UA FEW (*) RARE  PROTIME-INR     Status: None   Collection Time    02/22/13  1:49 PM      Result Value Range   Prothrombin Time 13.1  11.6 - 15.2 seconds   INR 1.00  0.00 - 1.49  POCT I-STAT TROPONIN I     Status: None   Collection Time    02/22/13  2:05 PM      Result Value Range   Troponin i, poc 0.02  0.00 - 0.08 ng/mL   Comment 3           POCT I-STAT, CHEM 8     Status: Abnormal   Collection Time    02/22/13  2:07 PM      Result Value Range   Sodium 137  135 - 145 mEq/L   Potassium 3.7  3.5 - 5.1 mEq/L   Chloride 105  96 - 112 mEq/L   BUN 17  6 -  23 mg/dL   Creatinine, Ser 4.09  0.50 - 1.10 mg/dL   Glucose, Bld 92  70 - 99 mg/dL   Calcium, Ion 8.11  9.14 - 1.30 mmol/L   TCO2 25  0 - 100 mmol/L   Hemoglobin 11.9 (*) 12.0 - 15.0 g/dL   HCT 78.2 (*) 95.6 - 21.3 %  CBC     Status: Abnormal   Collection Time    02/22/13  2:20 PM      Result Value Range   WBC 7.1  4.0 - 10.5 K/uL   RBC 3.95  3.87 - 5.11 MIL/uL   Hemoglobin 12.2  12.0 - 15.0 g/dL   HCT 08.6 (*) 57.8 - 46.9 %   MCV 88.9  78.0 - 100.0 fL   MCH 30.9  26.0 - 34.0 pg   MCHC 34.8  30.0 - 36.0 g/dL   RDW 62.9  52.8 - 41.3 %   Platelets 324  150 - 400 K/uL  COMPREHENSIVE METABOLIC PANEL     Status: Abnormal   Collection Time    02/22/13  2:20 PM      Result Value Range   Sodium 138  135 - 145 mEq/L   Potassium 3.9  3.5 - 5.1 mEq/L   Chloride 101  96 - 112 mEq/L   CO2 27  19 - 32 mEq/L   Glucose, Bld 91  70 - 99 mg/dL   BUN 18  6 - 23 mg/dL   Creatinine, Ser 2.44  0.50 - 1.10 mg/dL   Calcium 10.0  8.4 - 10.5 mg/dL   Total Protein 7.0  6.0 - 8.3 g/dL   Albumin 3.9  3.5 - 5.2 g/dL   AST 24  0 - 37 U/L   ALT 17  0 - 35 U/L   Alkaline Phosphatase 70  39 - 117 U/L   Total Bilirubin 0.5  0.3 - 1.2 mg/dL   GFR calc non Af Amer 80 (*) >90 mL/min   GFR calc Af Amer >90  >90 mL/min  TROPONIN I     Status: None   Collection Time    02/22/13  4:41 PM      Result Value Range   Troponin I <0.30  <0.30 ng/mL  TROPONIN I     Status: None   Collection Time    02/22/13 11:21 PM      Result Value Range   Troponin I <0.30  <0.30 ng/mL  MAGNESIUM     Status: None   Collection Time    02/22/13 11:21 PM      Result Value Range   Magnesium 1.8  1.5 - 2.5 mg/dL  TROPONIN I     Status: None   Collection Time    02/23/13  4:35 AM      Result Value Range   Troponin I <0.30  <0.30 ng/mL  CLOSTRIDIUM DIFFICILE BY PCR     Status: None   Collection Time    02/23/13  8:36 AM      Result Value Range   C difficile by pcr NEGATIVE  NEGATIVE    Signed: Denton Ar 02/23/2013,  1:02 PM   Time Spent on Discharge: 25 minutes Services Ordered on Discharge: None Equipment Ordered on Discharge: None

## 2013-02-23 NOTE — Progress Notes (Signed)
Late entry for 02/22/13 @2050 ..the patient complains of chest pressure and SOB (similar to episode that brought her to hospital).  VS stable--SBP 117/53 HR 72. EKG obtained with no acute changes noted.  Pt states it also feels similar to indigestion and was relieved after burping.  MD on call paged and informed of the above.  Pt also requesting sleep aide.  Orders received and will continue to monitor. Dierdre Highman, RN

## 2013-02-23 NOTE — H&P (Signed)
Date: 02/23/2013  Patient name: [name]  Medical record number: [id number]  Date of birth: [birth year]   I have seen and evaluated Quinn Plowman and discussed their care with the Residency Team.  80 yr. Old WF w/ hx RA, HTN, remote hx of melanoma and possible previous MI, presented with CP. She stated she had some chest pressure early in the morning associated with some left fingertip tingling. She denies any radiation of the pain other than this other symptoms. She admits to some nausea. She denied any hx of CP with exertion. She denied any SOB.  She denies any diaphoresis.  She admits to recent diarrhea on and off for the past 2 weeks.  This morning she visited UC and was sent to the ED.  She recently drove with her husband to [facility] two weeks ago. EKG showed some Twave inversions in lateral leads, but no significant ST changes. She had some PVCs noted.  She has had a total of four negative Trop I since admission. CXR shows evidence of likely underlying emphysema. She states she has not had any more episodes of CP since the morning prior to admission. She has a TIMI risk for NSTEMI of 1.  I discussed with her and her husband her options at this point, which include staying an additional day to undergo stress testing or to be discharged on medical therapy with follow up with her PCP and outpatient stress testing. She at this time is interested in getting back on the road with outpatient follow up.  This seems reasonable at this time, but I explained to her and her husband that if her chest discomfort occurs again she must immediately go to the nearest ED. She verbalized understanding.  She admits she has been having some diarrhea as early as this morning, which seems to be improving. She denies fever, chills, abdominal pain, melena, hematochezia. Her stool C diff was negative.  Physical Exam: Blood pressure 108/70, pulse 64, temperature 98.3 F (36.8 C), temperature source Oral, resp.  rate 17, height 5' (1.524 m), weight 124 lb 12.5 oz (56.6 kg), SpO2 93.00%. BP 108/70  Pulse 64  Temp(Src) 98.3 F (36.8 C) (Oral)  Resp 17  Ht 5' (1.524 m)  Wt 124 lb 12.5 oz (56.6 kg)  BMI 24.37 kg/m2  SpO2 93% General appearance: alert, cooperative and appears stated age Head: Normocephalic, without obvious abnormality, atraumatic Neck: no adenopathy, no carotid bruit, no JVD, supple, symmetrical, trachea midline and thyroid not enlarged, symmetric, no tenderness/mass/nodules Heart: regular rate and rhythm, S1, S2 normal, no murmur, click, rub or gallop and normal apical impulse Abdomen: soft, non-tender; bowel sounds normal; no masses,  no organomegaly Extremities: extremities normal, atraumatic, no cyanosis or edema and no edema, redness or tenderness in the calves or thighs Pulses: 2+ and symmetric Neurologic: Alert and oriented X 3, normal strength and tone. Normal symmetric reflexes. Normal coordination and gait  Lab results: Results for orders placed during the hospital encounter of 02/22/13 (from the past 24 hour(s))  URINALYSIS, ROUTINE W REFLEX MICROSCOPIC     Status: Abnormal   Collection Time    02/22/13  1:24 PM      Result Value Range   Color, Urine YELLOW  YELLOW   APPearance CLOUDY (*) CLEAR   Specific Gravity, Urine 1.014  1.005 - 1.030   pH 6.5  5.0 - 8.0   Glucose, UA NEGATIVE  NEGATIVE mg/dL   Hgb urine dipstick NEGATIVE  NEGATIVE  Bilirubin Urine NEGATIVE  NEGATIVE   Ketones, ur 15 (*) NEGATIVE mg/dL   Protein, ur NEGATIVE  NEGATIVE mg/dL   Urobilinogen, UA 0.2  0.0 - 1.0 mg/dL   Nitrite NEGATIVE  NEGATIVE   Leukocytes, UA MODERATE (*) NEGATIVE  URINE MICROSCOPIC-ADD ON     Status: Abnormal   Collection Time    02/22/13  1:24 PM      Result Value Range   Squamous Epithelial / LPF RARE  RARE   WBC, UA 3-6  <3 WBC/hpf   RBC / HPF 0-2  <3 RBC/hpf   Bacteria, UA FEW (*) RARE  PROTIME-INR     Status: None   Collection Time    02/22/13  1:49 PM       Result Value Range   Prothrombin Time 13.1  11.6 - 15.2 seconds   INR 1.00  0.00 - 1.49  POCT I-STAT TROPONIN I     Status: None   Collection Time    02/22/13  2:05 PM      Result Value Range   Troponin i, poc 0.02  0.00 - 0.08 ng/mL   Comment 3           POCT I-STAT, CHEM 8     Status: Abnormal   Collection Time    02/22/13  2:07 PM      Result Value Range   Sodium 137  135 - 145 mEq/L   Potassium 3.7  3.5 - 5.1 mEq/L   Chloride 105  96 - 112 mEq/L   BUN 17  6 - 23 mg/dL   Creatinine, Ser 0.45  0.50 - 1.10 mg/dL   Glucose, Bld 92  70 - 99 mg/dL   Calcium, Ion 4.09  8.11 - 1.30 mmol/L   TCO2 25  0 - 100 mmol/L   Hemoglobin 11.9 (*) 12.0 - 15.0 g/dL   HCT 91.4 (*) 78.2 - 95.6 %  CBC     Status: Abnormal   Collection Time    02/22/13  2:20 PM      Result Value Range   WBC 7.1  4.0 - 10.5 K/uL   RBC 3.95  3.87 - 5.11 MIL/uL   Hemoglobin 12.2  12.0 - 15.0 g/dL   HCT 21.3 (*) 08.6 - 57.8 %   MCV 88.9  78.0 - 100.0 fL   MCH 30.9  26.0 - 34.0 pg   MCHC 34.8  30.0 - 36.0 g/dL   RDW 46.9  62.9 - 52.8 %   Platelets 324  150 - 400 K/uL  COMPREHENSIVE METABOLIC PANEL     Status: Abnormal   Collection Time    02/22/13  2:20 PM      Result Value Range   Sodium 138  135 - 145 mEq/L   Potassium 3.9  3.5 - 5.1 mEq/L   Chloride 101  96 - 112 mEq/L   CO2 27  19 - 32 mEq/L   Glucose, Bld 91  70 - 99 mg/dL   BUN 18  6 - 23 mg/dL   Creatinine, Ser 4.13  0.50 - 1.10 mg/dL   Calcium 24.4  8.4 - 01.0 mg/dL   Total Protein 7.0  6.0 - 8.3 g/dL   Albumin 3.9  3.5 - 5.2 g/dL   AST 24  0 - 37 U/L   ALT 17  0 - 35 U/L   Alkaline Phosphatase 70  39 - 117 U/L   Total Bilirubin 0.5  0.3 - 1.2 mg/dL   GFR  calc non Af Amer 80 (*) >90 mL/min   GFR calc Af Amer >90  >90 mL/min  TROPONIN I     Status: None   Collection Time    02/22/13  4:41 PM      Result Value Range   Troponin I <0.30  <0.30 ng/mL  TROPONIN I     Status: None   Collection Time    02/22/13 11:21 PM      Result Value Range     Troponin I <0.30  <0.30 ng/mL  MAGNESIUM     Status: None   Collection Time    02/22/13 11:21 PM      Result Value Range   Magnesium 1.8  1.5 - 2.5 mg/dL  TROPONIN I     Status: None   Collection Time    02/23/13  4:35 AM      Result Value Range   Troponin I <0.30  <0.30 ng/mL  CLOSTRIDIUM DIFFICILE BY PCR     Status: None   Collection Time    02/23/13  8:36 AM      Result Value Range   C difficile by pcr NEGATIVE  NEGATIVE    Imaging results:  Dg Chest 2 View  02/22/2013   *RADIOLOGY REPORT*  Clinical Data: Chest heaviness and pressure.  CHEST - 2 VIEW  Comparison: CT chest 04/11/2006 and PA and lateral chest 08/25/2008.  Findings: The lungs are emphysematous but clear.  Heart size is upper normal.  No pneumothorax or pleural fluid.  Convex left thoracolumbar scoliosis is noted.  IMPRESSION: Emphysema without acute disease.   Original Report Authenticated By: Holley Dexter, M.D.    Assessment and Plan: I have seen and evaluated the patient as outlined above. I agree with the formulated Assessment and Plan as detailed in the residents' admission note, with the following changes:  1. Chest pain: TIMI score 1. She is interested on getting back on the road as stated above and not currently interested in staying another day for stress testing. She has had no evidence of ACS.  She understands that if her CP reoccurs she must stop at the nearest ED for evaluation.  Add ASA to her regimen on discharge. She needs F/U with her PCP in next 2 weeks to arrange outpatient stress testing. She is currently hemodynamically stable. Options at this time were explained to her and she is choosing to defer further testing in inpatient setting.  Jonah Blue, DO 6/16/201412:31 PM

## 2013-02-24 NOTE — Progress Notes (Signed)
Utilization review completed.  

## 2013-02-25 NOTE — Discharge Summary (Signed)
  Date: 02/25/2013  Patient name: [name]  Medical record number: [id number]  Date of birth: 03/11/1933   This patient has been seen and the plan of care was discussed with the house staff. Please see their note for complete details. I concur with their findings and plan. Jonah Blue, DO 02/25/2013, 3:18 PM

## 2013-09-10 HISTORY — PX: TOTAL HIP REVISION: SHX763

## 2014-01-22 ENCOUNTER — Ambulatory Visit: Payer: Self-pay | Admitting: Emergency Medicine

## 2014-02-11 ENCOUNTER — Ambulatory Visit: Payer: Self-pay

## 2014-03-09 ENCOUNTER — Ambulatory Visit: Payer: Self-pay | Admitting: Otolaryngology

## 2014-04-07 ENCOUNTER — Telehealth: Payer: Self-pay | Admitting: *Deleted

## 2014-04-07 ENCOUNTER — Encounter: Payer: Self-pay | Admitting: Pulmonary Disease

## 2014-04-07 ENCOUNTER — Ambulatory Visit (INDEPENDENT_AMBULATORY_CARE_PROVIDER_SITE_OTHER): Payer: MEDICARE | Admitting: Pulmonary Disease

## 2014-04-07 ENCOUNTER — Other Ambulatory Visit (INDEPENDENT_AMBULATORY_CARE_PROVIDER_SITE_OTHER): Payer: MEDICARE

## 2014-04-07 ENCOUNTER — Ambulatory Visit (INDEPENDENT_AMBULATORY_CARE_PROVIDER_SITE_OTHER)
Admission: RE | Admit: 2014-04-07 | Discharge: 2014-04-07 | Disposition: A | Discharge status: Home / Self Care | Payer: MEDICARE | Source: Ambulatory Visit | Attending: Pulmonary Disease | Admitting: Pulmonary Disease

## 2014-04-07 VITALS — BP 128/84 | HR 58 | Ht 60.0 in | Wt 118.0 lb

## 2014-04-07 DIAGNOSIS — J449 Chronic obstructive pulmonary disease, unspecified: Secondary | ICD-10-CM | POA: Insufficient documentation

## 2014-04-07 DIAGNOSIS — R222 Localized swelling, mass and lump, trunk: Secondary | ICD-10-CM

## 2014-04-07 DIAGNOSIS — J398 Other specified diseases of upper respiratory tract: Secondary | ICD-10-CM | POA: Insufficient documentation

## 2014-04-07 DIAGNOSIS — J4489 Other specified chronic obstructive pulmonary disease: Secondary | ICD-10-CM

## 2014-04-07 LAB — PROTIME-INR
INR: 1 ratio (ref 0.8–1.0)
Prothrombin Time: 11.4 s (ref 9.6–13.1)

## 2014-04-07 NOTE — Progress Notes (Signed)
Subjective:    Patient ID: [name], female    DOB: 05/24/1933, 80 y.o.   MRN: [id number]  HPI  CC: I have a growth in my throat.  She states that for several weeks she has been having problems with cough with phlegm and mucus production  She has cough which is primarily at night with mucus production.  She also has an earache.  The mucus is clear, rarely yellow.   She previously smoked cigarettes > 1-2 packs per day.  She stared at age 19 year and quit in her early 75's.    She was hospitalized recently (january) after bronchitis and pneumonia.  She was sent home with oxygen and then tripped over her oxygen tubing and broke her hip. This delayed recovery from the bronchitis for weeks.  She isn't sure that he has really improved or completely recovered completely.  She continues to cough.  She has sinus congestion but this has improved.    She continues to be short of reath, every thing makes her short of breath.  Just walking up three steps makes her more short of breath.  Getting in and out of a car makes her dyspnic.  She did 21 days of rehab.  She walks regularly.  She can walk through [place].    Past Medical History  Diagnosis Date  . Asthma   . Hypertension   . Melanoma     scalp (2007) & leg (2013)  . Rheumatoid arthritis   . Osteoporosis   . Myocardial infarction 1999    no PCI or CABG     No family history on file.   History   Social History  . Marital Status: Married    Spouse Name: N/A    Number of Children: N/A  . Years of Education: N/A   Occupational History  . Not on file.   Social History Main Topics  . Smoking status: Former Smoker -- 1.50 packs/day for 22 years    Types: Cigarettes    Quit date: 04/07/1974  . Smokeless tobacco: Never Used  . Alcohol Use: No  . Drug Use: No  . Sexual Activity: No   Other Topics Concern  . Not on file   Social History Narrative   Lives in [place].  Originally from [place].  Children live in [place].       Allergies  Allergen Reactions  . Celebrex [Celecoxib] Other (See Comments)    Heart stops  . Morphine And Related     Hallucinations      Outpatient Prescriptions Prior to Visit  Medication Sig Dispense Refill  . albuterol-ipratropium (COMBIVENT) 18-103 MCG/ACT inhaler Inhale 2 puffs into the lungs every 6 (six) hours as needed for wheezing.      . Cholecalciferol (VITAMIN D PO) Take 1 tablet by mouth daily.       . fish oil-omega-3 fatty acids 1000 MG capsule Take 2 g by mouth daily.      . Flaxseed, Linseed, OIL Take by mouth.      . gabapentin (NEURONTIN) 100 MG capsule Take 200 mg by mouth 3 (three) times daily.       . Glucosamine HCl-Glucosamin SO4 200-300 MG TABS Take 1 tablet by mouth daily.       . metoprolol succinate (TOPROL-XL) 100 MG 24 hr tablet Take 100 mg by mouth 2 (two) times daily. Take with or immediately following a meal.      . nitroGLYCERIN (NITROSTAT) 0.4 MG SL tablet Place  1 tablet (0.4 mg total) under the tongue every 5 (five) minutes as needed for chest pain.  30 tablet  3  . omeprazole (PRILOSEC) 20 MG capsule Take 20 mg by mouth daily.      Marland Kitchen aspirin EC 81 MG EC tablet Take 1 tablet (81 mg total) by mouth daily.  90 tablet  3  . benazepril-hydrochlorthiazide (LOTENSIN HCT) 20-12.5 MG per tablet Take 1 tablet by mouth daily.      . calcium carbonate (OS-CAL) 600 MG TABS Take 600 mg by mouth 2 (two) times daily with a meal.      . fluticasone-salmeterol (ADVAIR HFA) 45-21 MCG/ACT inhaler Inhale 2 puffs into the lungs 2 (two) times daily.      . folic acid (FOLVITE) 1 MG tablet Take 1 mg by mouth daily.      . methotrexate (RHEUMATREX) 2.5 MG tablet Take 17.5 mg by mouth once a week. On Thursday Caution:Chemotherapy. Protect from light.      . rosuvastatin (CRESTOR) 5 MG tablet Take 5 mg by mouth daily.       No facility-administered medications prior to visit.      Review of Systems  Constitutional: Negative for fever and unexpected weight change.   HENT: Positive for congestion, sore throat and trouble swallowing. Negative for dental problem, ear pain, nosebleeds, postnasal drip, rhinorrhea, sinus pressure and sneezing.   Eyes: Negative for redness and itching.  Respiratory: Positive for cough and shortness of breath. Negative for chest tightness and wheezing.   Cardiovascular: Negative for palpitations and leg swelling.  Gastrointestinal: Negative for nausea and vomiting.  Genitourinary: Negative for dysuria.  Musculoskeletal: Negative for joint swelling.  Skin: Negative for rash.  Neurological: Negative for headaches.  Hematological: Does not bruise/bleed easily.  Psychiatric/Behavioral: Negative for dysphoric mood. The patient is not nervous/anxious.        Objective:   Physical Exam Filed Vitals:   04/07/14 1413  BP: 128/84  Pulse: 58  Height: 5' (1.524 m)  Weight: 118 lb (53.524 kg)  SpO2: 92%  RA  Ambulated 500 feet on RA and her O2 saturation remained > 92%  Gen: well appearing, no acute distress HEENT: NCAT, PERRL, EOMi, OP clear, neck supple but she has decreased neck mobility PULM: Diminished airflow, no wheezing CV: RRR, slight systolic murmur, no JVD AB: BS+, soft, nontender, no hsm Ext: warm, no edema, no clubbing, no cyanosis Derm: no rash or skin breakdown Neuro: A&Ox4, CN II-XII intact, strength 5/5 in all 4 extremities  03/09/2014 CT spine reviewed> 0.38mm tracheal nodule noted just below larynx, 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 Severe C1-C2 arthritis left articulation, no significant spinal stenosis suspected     Assessment & Plan:   COPD, moderate She has moderate airflow obstruction, but in the setting of her concomitant restriction (suggested by the low FVC) we can't really quantify the degree of airflow obstruction.  She may need to have full pulmonary function testing at some point to better assess the suggestion of her restrictive lung disease, but for now  the most important thing we can do is add pulmonary rehabilitation as she is profoundly deconditioned. Her current medication regimen is adequate considering her degree of airflow obstruction.  Plan: -Pulmonary rehabilitation referral -Continue Breo -Continue when necessary albuterol -Followup 4-6 weeks  Tracheal mass She has a very small lesion in her proximal trachea on a recent CT neck which appears to be a benign polyp. However, I agree with the radiologist that  considering her smoking history we do need to visualize this. Depending on its appearance we will determine whether or not we should take a biopsy as sometimes proximal tracheal lesions are best observed rather than manipulated.  Plan: -Bronchoscopy 04/08/2014 -CXR prior to bronchoscopy -CBC and PT/INR today    Updated Medication List Outpatient Encounter Prescriptions as of 04/07/2014  Medication Sig  . albuterol-ipratropium (COMBIVENT) 18-103 MCG/ACT inhaler Inhale 2 puffs into the lungs every 6 (six) hours as needed for wheezing.  . Cholecalciferol (VITAMIN D PO) Take 1 tablet by mouth daily.   . citalopram (CELEXA) 20 MG tablet Take 20 mg by mouth daily.  . fish oil-omega-3 fatty acids 1000 MG capsule Take 2 g by mouth daily.  . Flaxseed, Linseed, OIL Take by mouth.  . Fluticasone Furoate-Vilanterol (BREO ELLIPTA) 100-25 MCG/INH AEPB Inhale 1 puff into the lungs daily.  Marland Kitchen gabapentin (NEURONTIN) 100 MG capsule Take 200 mg by mouth 3 (three) times daily.   . Glucosamine HCl-Glucosamin SO4 200-300 MG TABS Take 1 tablet by mouth daily.   . hydrochlorothiazide (HYDRODIURIL) 25 MG tablet Take 25 mg by mouth daily.  Marland Kitchen HYDROcodone-acetaminophen (NORCO) 7.5-325 MG per tablet Take 1 tablet by mouth every 6 (six) hours as needed for moderate pain.  Marland Kitchen ipratropium-albuterol (DUONEB) 0.5-2.5 (3) MG/3ML SOLN Take 3 mLs by nebulization every 6 (six) hours as needed.  Marland Kitchen losartan (COZAAR) 50 MG tablet Take 50 mg by mouth daily.  .  metoprolol succinate (TOPROL-XL) 100 MG 24 hr tablet Take 100 mg by mouth 2 (two) times daily. Take with or immediately following a meal.  . nitroGLYCERIN (NITROSTAT) 0.4 MG SL tablet Place 1 tablet (0.4 mg total) under the tongue every 5 (five) minutes as needed for chest pain.  Marland Kitchen omeprazole (PRILOSEC) 20 MG capsule Take 20 mg by mouth daily.  . [DISCONTINUED] aspirin EC 81 MG EC tablet Take 1 tablet (81 mg total) by mouth daily.  . [DISCONTINUED] benazepril-hydrochlorthiazide (LOTENSIN HCT) 20-12.5 MG per tablet Take 1 tablet by mouth daily.  . [DISCONTINUED] calcium carbonate (OS-CAL) 600 MG TABS Take 600 mg by mouth 2 (two) times daily with a meal.  . [DISCONTINUED] fluticasone-salmeterol (ADVAIR HFA) 45-21 MCG/ACT inhaler Inhale 2 puffs into the lungs 2 (two) times daily.  . [DISCONTINUED] folic acid (FOLVITE) 1 MG tablet Take 1 mg by mouth daily.  . [DISCONTINUED] methotrexate (RHEUMATREX) 2.5 MG tablet Take 17.5 mg by mouth once a week. On Thursday Caution:Chemotherapy. Protect from light.  . [DISCONTINUED] rosuvastatin (CRESTOR) 5 MG tablet Take 5 mg by mouth daily.

## 2014-04-07 NOTE — Patient Instructions (Signed)
We will arrange for a bronchoscopy tomorrow at [facility]  We will check your blood work today before the test We will refer you to [facility] for pulmonary rehab at [facility] Keep taking the Breo and Albuterol We will see you back in 2-4 weeks

## 2014-04-07 NOTE — Telephone Encounter (Signed)
LM x 1 for pt to return call. Pt had OV with Dr Lake Bells today 04/07/14 Per Dr Lake Bells, he wanted her to have a chest cxr today after her visit and he forgot to mention it to her.  Order has been placed. I left a voicemail for the patient to return our call regarding having a cxr done. Per BQ, the patient may either come back to our office to complete or go to [facility] tomorrow.

## 2014-04-07 NOTE — Assessment & Plan Note (Signed)
She has moderate airflow obstruction, but in the setting of her concomitant restriction (suggested by the low FVC) we can't really quantify the degree of airflow obstruction.  She may need to have full pulmonary function testing at some point to better assess the suggestion of her restrictive lung disease, but for now the most important thing we can do is add pulmonary rehabilitation as she is profoundly deconditioned. Her current medication regimen is adequate considering her degree of airflow obstruction.  Plan: -Pulmonary rehabilitation referral -Continue Breo -Continue when necessary albuterol -Followup 4-6 weeks

## 2014-04-07 NOTE — Assessment & Plan Note (Addendum)
She has a very small lesion in her proximal trachea on a recent CT neck which appears to be a benign polyp. However, I agree with the radiologist that considering her smoking history we do need to visualize this. Depending on its appearance we will determine whether or not we should take a biopsy as sometimes proximal tracheal lesions are best observed rather than manipulated.  Plan: -Bronchoscopy 04/08/2014 -CXR prior to bronchoscopy -CBC and PT/INR today

## 2014-04-08 ENCOUNTER — Ambulatory Visit (HOSPITAL_COMMUNITY)
Admission: RE | Admit: 2014-04-08 | Discharge: 2014-04-08 | Disposition: A | Discharge status: Home / Self Care | Payer: MEDICARE | Source: Ambulatory Visit | Attending: Pulmonary Disease | Admitting: Pulmonary Disease

## 2014-04-08 ENCOUNTER — Encounter (HOSPITAL_COMMUNITY): Payer: MEDICARE

## 2014-04-08 ENCOUNTER — Ambulatory Visit (HOSPITAL_COMMUNITY): Admit: 2014-04-08 | Payer: Self-pay | Admitting: Pulmonary Disease

## 2014-04-08 ENCOUNTER — Encounter (HOSPITAL_COMMUNITY)
Admission: RE | Disposition: A | Discharge status: Home / Self Care | Payer: Self-pay | Source: Ambulatory Visit | Attending: Pulmonary Disease

## 2014-04-08 ENCOUNTER — Other Ambulatory Visit: Payer: Self-pay | Admitting: Pulmonary Disease

## 2014-04-08 ENCOUNTER — Encounter (HOSPITAL_COMMUNITY): Payer: Self-pay

## 2014-04-08 DIAGNOSIS — J398 Other specified diseases of upper respiratory tract: Secondary | ICD-10-CM | POA: Diagnosis present

## 2014-04-08 DIAGNOSIS — J449 Chronic obstructive pulmonary disease, unspecified: Secondary | ICD-10-CM

## 2014-04-08 DIAGNOSIS — I10 Essential (primary) hypertension: Secondary | ICD-10-CM | POA: Insufficient documentation

## 2014-04-08 DIAGNOSIS — Z87891 Personal history of nicotine dependence: Secondary | ICD-10-CM | POA: Diagnosis not present

## 2014-04-08 DIAGNOSIS — J4489 Other specified chronic obstructive pulmonary disease: Secondary | ICD-10-CM

## 2014-04-08 DIAGNOSIS — M810 Age-related osteoporosis without current pathological fracture: Secondary | ICD-10-CM | POA: Insufficient documentation

## 2014-04-08 DIAGNOSIS — J988 Other specified respiratory disorders: Secondary | ICD-10-CM | POA: Diagnosis present

## 2014-04-08 DIAGNOSIS — Z85820 Personal history of malignant melanoma of skin: Secondary | ICD-10-CM | POA: Diagnosis not present

## 2014-04-08 DIAGNOSIS — R222 Localized swelling, mass and lump, trunk: Secondary | ICD-10-CM

## 2014-04-08 DIAGNOSIS — M069 Rheumatoid arthritis, unspecified: Secondary | ICD-10-CM | POA: Insufficient documentation

## 2014-04-08 DIAGNOSIS — I252 Old myocardial infarction: Secondary | ICD-10-CM | POA: Diagnosis not present

## 2014-04-08 HISTORY — PX: VIDEO BRONCHOSCOPY: SHX5072

## 2014-04-08 LAB — CBC
HCT: 41.7 % (ref 36.0–46.0)
Hemoglobin: 14 g/dL (ref 12.0–15.0)
MCH: 30.8 pg (ref 26.0–34.0)
MCHC: 33.6 g/dL (ref 30.0–36.0)
MCV: 91.6 fL (ref 78.0–100.0)
PLATELETS: 250 10*3/uL (ref 150–400)
RBC: 4.55 MIL/uL (ref 3.87–5.11)
RDW: 12.9 % (ref 11.5–15.5)
WBC: 11.2 10*3/uL — ABNORMAL HIGH (ref 4.0–10.5)

## 2014-04-08 SURGERY — VIDEO BRONCHOSCOPY WITHOUT FLUORO
Anesthesia: Moderate Sedation | Laterality: Bilateral

## 2014-04-08 MED ORDER — MIDAZOLAM HCL 5 MG/ML IJ SOLN
INTRAMUSCULAR | Status: AC
  Filled 2014-04-08: qty 2

## 2014-04-08 MED ORDER — FENTANYL CITRATE 0.05 MG/ML IJ SOLN
INTRAMUSCULAR | Status: DC | PRN
  Administered 2014-04-08 (×2): 25 ug via INTRAVENOUS

## 2014-04-08 MED ORDER — LIDOCAINE HCL (PF) 1 % IJ SOLN
INTRAMUSCULAR | Status: DC | PRN
  Administered 2014-04-08: 6 mL

## 2014-04-08 MED ORDER — BENZONATATE 100 MG PO CAPS: 100.0000 mg | ORAL_CAPSULE | Freq: Three times a day (TID) | ORAL | Status: DC | PRN

## 2014-04-08 MED ORDER — MIDAZOLAM HCL 5 MG/ML IJ SOLN
INTRAMUSCULAR | Status: DC | PRN
  Administered 2014-04-08: 1 mg via INTRAVENOUS

## 2014-04-08 MED ORDER — PHENYLEPHRINE HCL 0.25 % NA SOLN
NASAL | Status: DC | PRN
  Administered 2014-04-08: 2 via NASAL

## 2014-04-08 MED ORDER — LIDOCAINE HCL 2 % EX GEL
CUTANEOUS | Status: DC | PRN
  Administered 2014-04-08: 1

## 2014-04-08 MED ORDER — FENTANYL CITRATE 0.05 MG/ML IJ SOLN
INTRAMUSCULAR | Status: AC
  Filled 2014-04-08: qty 4

## 2014-04-08 NOTE — Discharge Instructions (Signed)
Flexible Bronchoscopy, Care After These instructions give you information on caring for yourself after your procedure. Your doctor may also give you more specific instructions. Call your doctor if you have any problems or questions after your procedure. HOME CARE  Do not eat or drink anything for 2 hours after your procedure. If you try to eat or drink before the medicine wears off, food or drink could go into your lungs. You could also burn yourself. You may eat at 2:30 pm  After 2 hours have passed and when you can cough and gag normally, you may eat soft food and drink liquids slowly.  The day after the test, you may eat your normal diet.  You may do your normal activities.  Keep all doctor visits. GET HELP RIGHT AWAY IF:  You get more and more short of breath.  You get light-headed.  You feel like you are going to pass out (faint).  You have chest pain.  You have new problems that worry you.  You cough up more than a little blood.  You cough up more blood than before. MAKE SURE YOU:  Understand these instructions.  Will watch your condition.  Will get help right away if you are not doing well or get worse. Document Released: 06/24/2009 Document Revised: 09/01/2013 Document Reviewed: 05/01/2013 [facility] Patient Information 2015 [place], [place]. This information is not intended to replace advice given to you by your health care provider. Make sure you discuss any questions you have with your health care provider.

## 2014-04-08 NOTE — H&P (Signed)
[name] is an 80 y.o. female.   Chief Complaint: bronchoscopy HPI: 80 y/o female with COPD and a tracheal polyp seen on CT neck presents for bronchoscopy.  Seen yesterday and clinic and there have been no changes to my history since then.  She is feeling well this morning.  Past Medical History  Diagnosis Date  . Asthma   . Hypertension   . Melanoma     scalp (2007) & leg (2013)  . Rheumatoid arthritis   . Osteoporosis   . Myocardial infarction 1999    no PCI or CABG    Past Surgical History  Procedure Laterality Date  . Abdominal hysterectomy    . Breast surgery    . Cardiac surgery    . Melanoma excision  2007    scalp  . Carpal tunnel release    . Shoulder surgery    . Melanoma excision Right 2013    lower leg  . Total hip revision  2015    No family history on file. Social History:  reports that she quit smoking about 40 years ago. Her smoking use included Cigarettes. She has a 33 pack-year smoking history. She has never used smokeless tobacco. She reports that she does not drink alcohol or use illicit drugs.  Allergies:  Allergies  Allergen Reactions  . Celebrex [Celecoxib] Other (See Comments)    Heart stops  . Morphine And Related     Hallucinations     Medications Prior to Admission  Medication Sig Dispense Refill  . albuterol-ipratropium (COMBIVENT) 18-103 MCG/ACT inhaler Inhale 2 puffs into the lungs every 6 (six) hours as needed for wheezing.      . Cholecalciferol (VITAMIN D PO) Take 1 tablet by mouth daily.       . citalopram (CELEXA) 20 MG tablet Take 20 mg by mouth daily.      . fish oil-omega-3 fatty acids 1000 MG capsule Take 2 g by mouth daily.      . Flaxseed, Linseed, OIL Take by mouth.      . Fluticasone Furoate-Vilanterol (BREO ELLIPTA) 100-25 MCG/INH AEPB Inhale 1 puff into the lungs daily.      Marland Kitchen gabapentin (NEURONTIN) 100 MG capsule Take 200 mg by mouth 3 (three) times daily.       . Glucosamine HCl-Glucosamin SO4 200-300 MG TABS  Take 1 tablet by mouth daily.       . hydrochlorothiazide (HYDRODIURIL) 25 MG tablet Take 25 mg by mouth daily.      Marland Kitchen HYDROcodone-acetaminophen (NORCO) 7.5-325 MG per tablet Take 1 tablet by mouth every 6 (six) hours as needed for moderate pain.      Marland Kitchen ipratropium-albuterol (DUONEB) 0.5-2.5 (3) MG/3ML SOLN Take 3 mLs by nebulization every 6 (six) hours as needed.      Marland Kitchen losartan (COZAAR) 50 MG tablet Take 50 mg by mouth daily.      . metoprolol succinate (TOPROL-XL) 100 MG 24 hr tablet Take 100 mg by mouth 2 (two) times daily. Take with or immediately following a meal.      . omeprazole (PRILOSEC) 20 MG capsule Take 20 mg by mouth daily.      . nitroGLYCERIN (NITROSTAT) 0.4 MG SL tablet Place 1 tablet (0.4 mg total) under the tongue every 5 (five) minutes as needed for chest pain.  30 tablet  3    Results for orders placed during the hospital encounter of 04/08/14 (from the past 48 hour(s))  CBC     Status: Abnormal  Collection Time    04/08/14 11:25 AM      Result Value Ref Range   WBC 11.2 (*) 4.0 - 10.5 K/uL   RBC 4.55  3.87 - 5.11 MIL/uL   Hemoglobin 14.0  12.0 - 15.0 g/dL   HCT 41.7  36.0 - 46.0 %   MCV 91.6  78.0 - 100.0 fL   MCH 30.8  26.0 - 34.0 pg   MCHC 33.6  30.0 - 36.0 g/dL   RDW 12.9  11.5 - 15.5 %   Platelets 250  150 - 400 K/uL   Dg Chest 2 View  04/07/2014   CLINICAL DATA:  History of asthma  EXAM: CHEST  2 VIEW  COMPARISON:  02/22/2013  FINDINGS: Mild cardiac enlargement. Vascular pattern normal. Mild to moderate bilateral perihilar bronchitic change. No consolidation or effusion.  IMPRESSION: Chronic bronchitic change.  No acute findings.   Electronically Signed   By: Skipper Cliche M.D.   On: 04/07/2014 16:39    ROS  Pulse 60, resp. rate 17, SpO2 89.00%. Physical Exam  Gen: anxious HEENT: NCAT, OP clear PULM: CTA B CV: RRR, no mgr AB: BS+, soft  Ext: warm Assessment/Plan 1) tracheal mass > likely polyp, plan bronchoscopy  today 2) COPD> per yesterday's  note   [name] 04/08/2014, 12:31 PM

## 2014-04-08 NOTE — Progress Notes (Signed)
RT called patient and instructed her to arrive to [facility] admission at 1115. Pt reported she ate a light breakfast of yogurt and coffee this morning as per directed by Dr Lake Bells.

## 2014-04-08 NOTE — Op Note (Signed)
PCCM Bronchoscopy Procedure Note  The patient was informed of the risks (including but not limited to bleeding, infection, respiratory failure, lung injury, tooth/oral injury) and benefits of the procedure and gave consent, see chart.  Indication: Tracheal mass  Location: [facility]  Condition pre procedure: Stable  Medications for procedure: Versed 1mg , Fentanyl 12mcg  Procedure description: The bronchoscope was introduced through the nose and passed to the bilateral lungs to the level of the subsegmental bronchi throughout the tracheobronchial tree.  Airway exam revealed normal appearing larynx though the left vocal cord was noted to move slower than the right.  There were no endobronchial lesions, mucus, or areas of inflammation found on exam.  Near the completion of the procedure I withdrew the bronchoscope slowly with careful attention to the trachea.  There was no airway lesion identified.  Multiple pictures of the trache were taken, see scanned record in EPIC.  Procedures performed: none  Specimens sent: none  Condition post procedure: Stable  EBL: none  Complications: none  Final Diagnosis: normal appearing tracheobronchial tree   Roselie Awkward, MD [place] [facility] Pager: [phone] Cell: [phone] If no response, call [phone]

## 2014-04-08 NOTE — Progress Notes (Signed)
Video bronchoscopy intervention performed. MD inspected airway, no specimens taken Amy Awkward, MD [place] [facility] Pager: [phone] Cell: [phone] If no response, call [phone]

## 2014-04-12 ENCOUNTER — Encounter (HOSPITAL_COMMUNITY): Payer: Self-pay | Admitting: Pulmonary Disease

## 2014-04-28 ENCOUNTER — Ambulatory Visit: Payer: MEDICARE | Admitting: Pulmonary Disease

## 2014-11-05 IMAGING — CT CT CERVICAL SPINE WITHOUT CONTRAST
3 of 4 series · 13 of 33 positions shown, 16 images · non-contrast
Comparison: [HOSPITAL] Neck CT 04/11/2006.

CLINICAL DATA: 81-year-old female severe neck pain extending from
the left ear to the shoulder and increasing. Multiple falls
including left hip fracture. Initial encounter. History of melanoma
on the scalp.

EXAM:
CT CERVICAL SPINE WITHOUT CONTRAST
TECHNIQUE: Multidetector CT imaging of the cervical spine was performed without
intravenous contrast. Multiplanar CT image reconstructions were also
generated.

[Series 4: axial · axial · 0.29mm/px · z∈[-245,-157]mm · 5 of 67 slices shown, 7 images]
[im 12/67  soft-tissue]
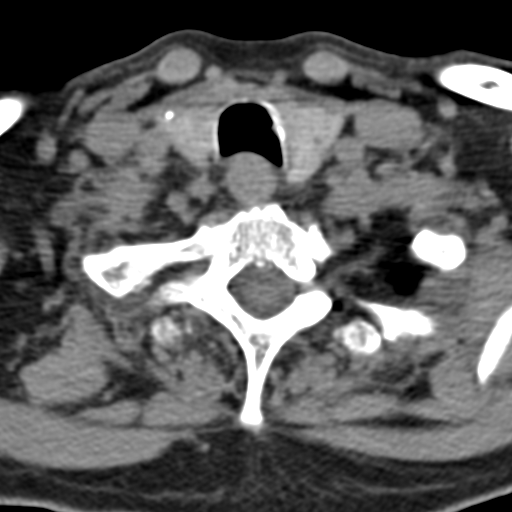
[im 12/67  bone]
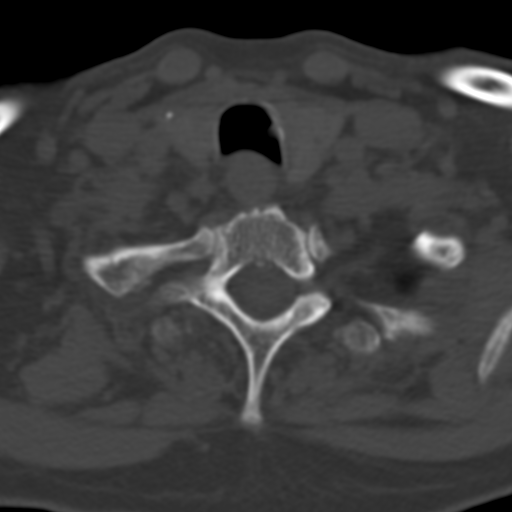
[im 23/67  bone]
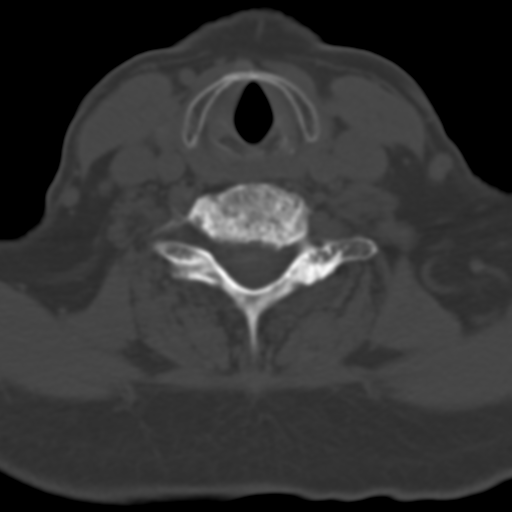
[im 34/67  bone]
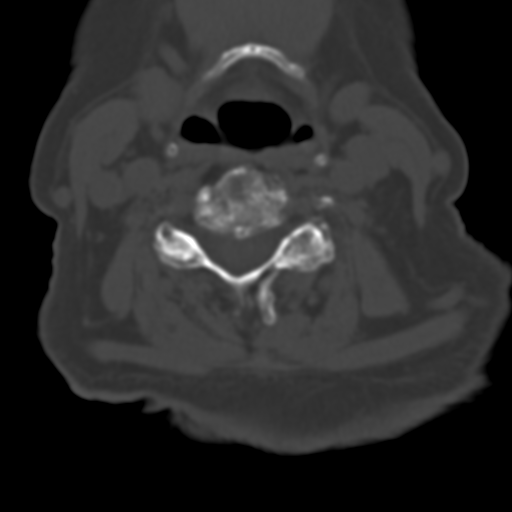
[im 45/67  bone]
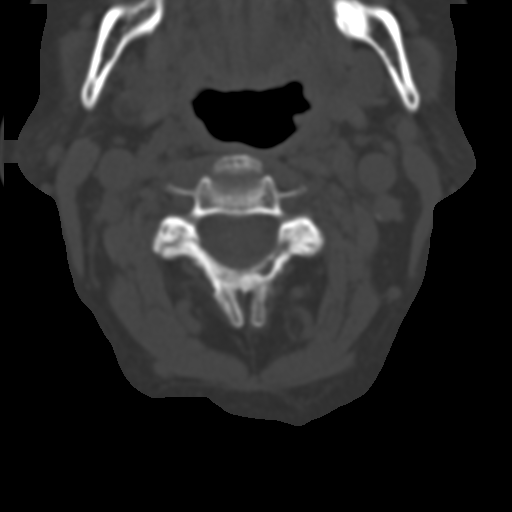
[im 56/67  soft-tissue]
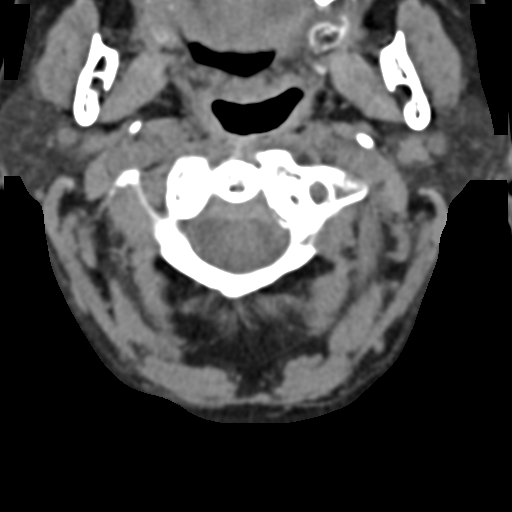
[im 56/67  bone]
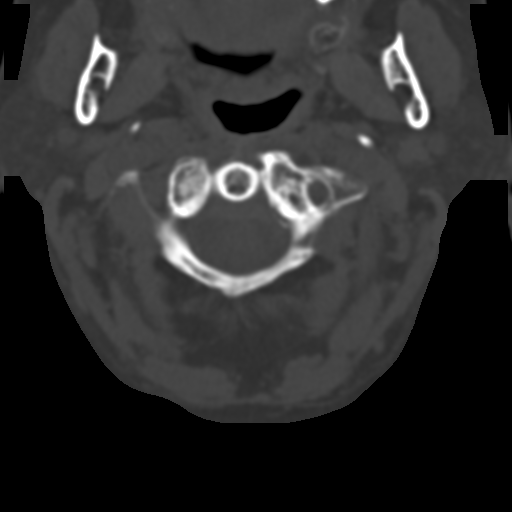

[Series 7: sag bone · sagittal · 0.23mm/px · 5 of 58 slices shown, 6 images]
[im 20/58  bone]
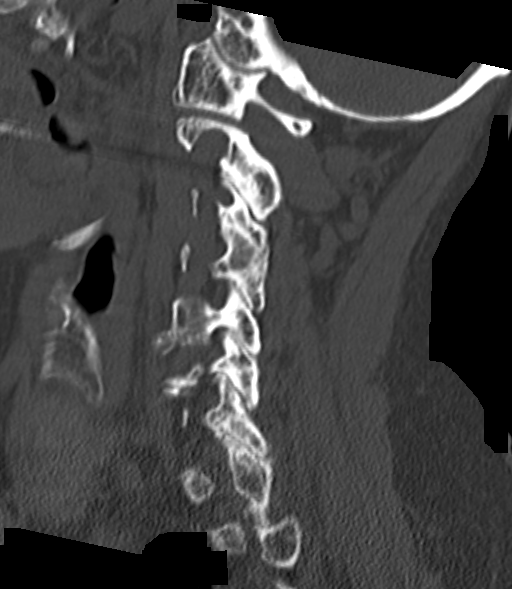
[im 24/58  bone]
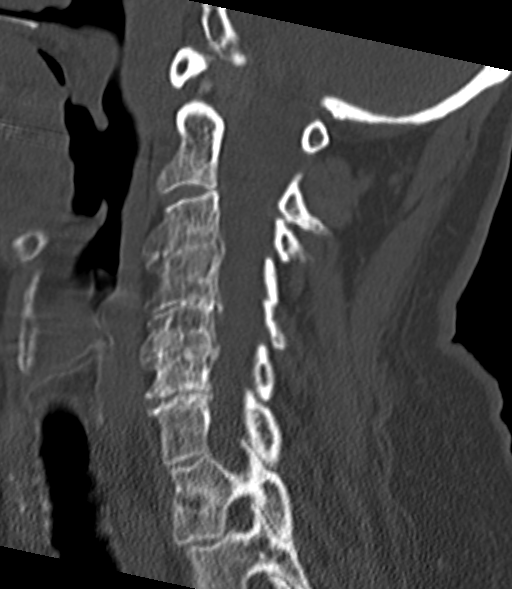
[im 29/58  soft-tissue]
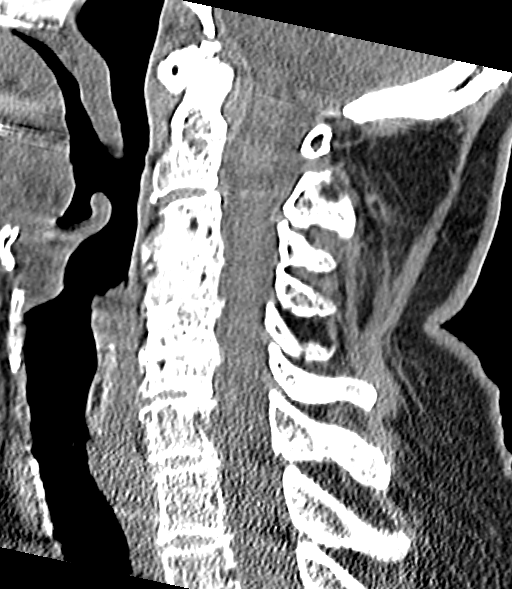
[im 29/58  bone]
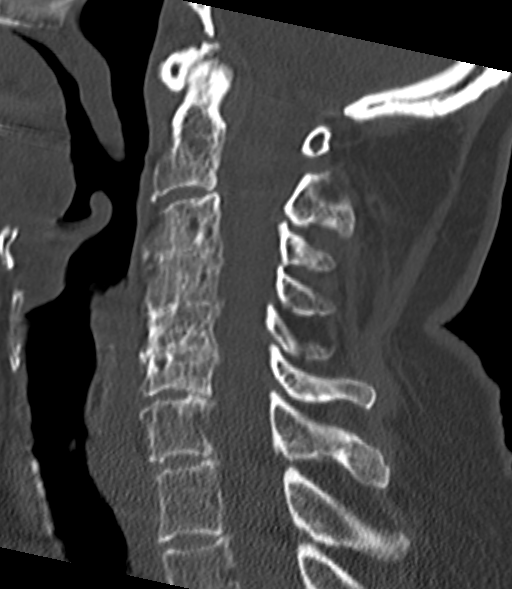
[im 34/58  bone]
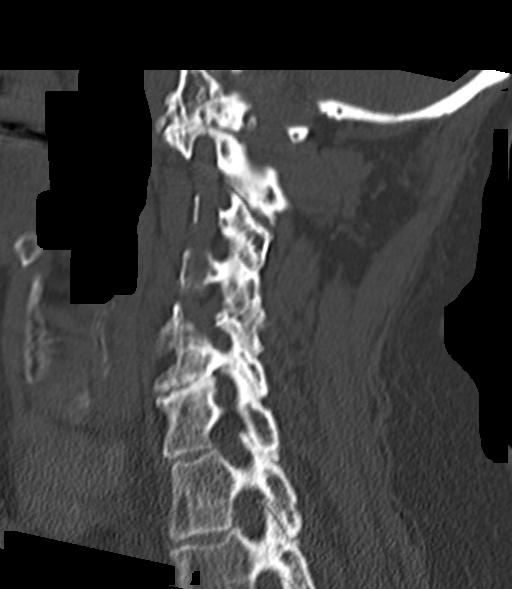
[im 39/58  bone]
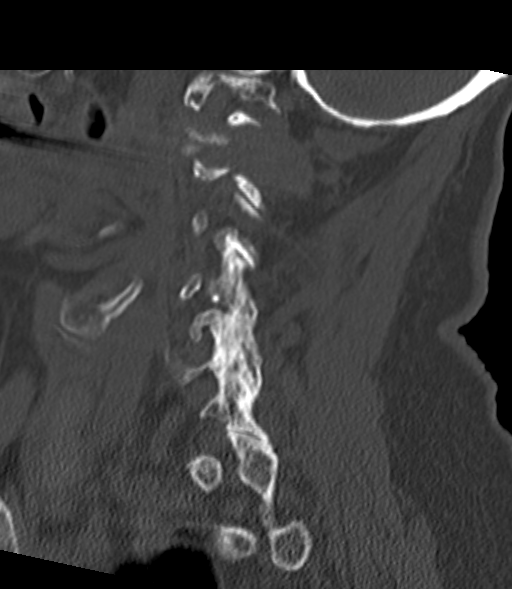

[Series 8: cor bone · coronal · 0.23mm/px · 3 of 46 slices shown]
[im 10/46  bone]
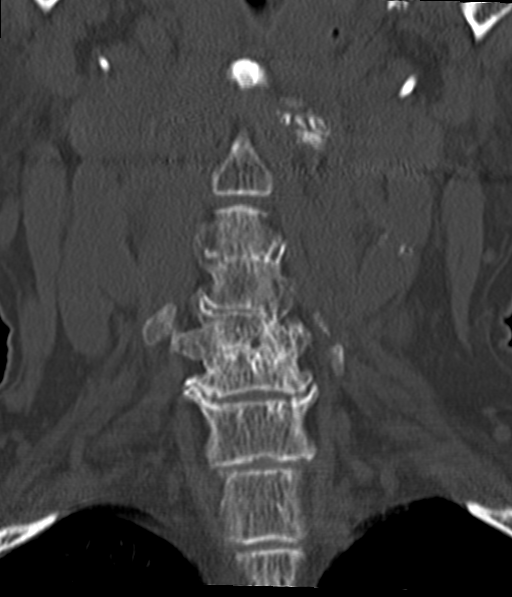
[im 19/46  bone]
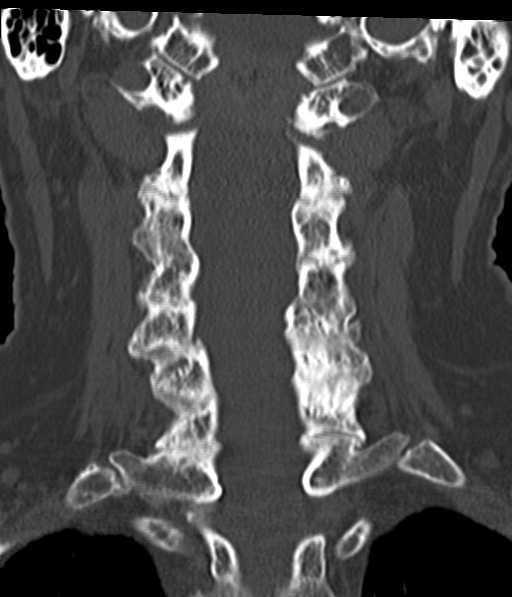
[im 28/46  bone]
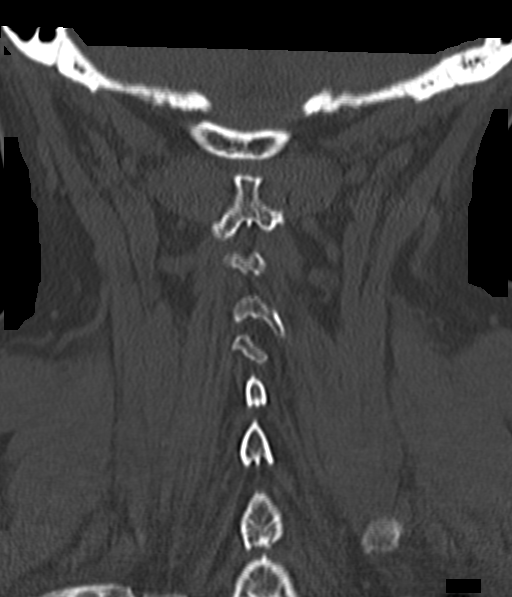

[13 of 33 positions shown; findings below may reference images not displayed]

FINDINGS: Osteopenia. Chronic straightening of cervical lordosis and
suggestion of multilevel cervical spine ankylosis, stable since
5003. See cervical details below.

Chronic emphysema in the lung apices. Visible trachea remarkable for
a left lateral adherent focus of intermediate density ( image 55 of
series 3) measuring 5-6 mm. This is new since 5003 and could be a
tracheal polyp. Larynx within normal limits.

Pharynx contours mildly asymmetric but overall within normal limits.
Other visualized paraspinal soft tissues appear stable. Visualized
paranasal sinuses and mastoids are clear. Calcified atherosclerosis
at the skull base. Grossly negative visualized posterior fossa.

C1-C2: Very severe joint space loss on the left with subchondral
geodes, sclerosis, and osteophytosis (series 7, image 35 and series
3, image 14). These changes appear to be new since 5003.

C2-C3: Mild facet hypertrophy greater on the right. Borderline to
mild right C3 foraminal stenosis.

C3-C4: Evidence of chronic interbody and posterior element
ankylosis. Facet and uncovertebral hypertrophy resulting an moderate
right C4 foraminal stenosis. Endplate spurring without significant
spinal stenosis suspected.

C4-C5: Evidence of chronic interbody and posterior element
ankylosis. Facet and uncovertebral hypertrophy greater on the left.
Mild left C5 foraminal stenosis.

C5-C6: Evidence of chronic interbody and posterior element
ankylosis. Mild facet and moderate uncovertebral hypertrophy greater
on the right. Moderate right C6 foraminal stenosis.

C6-C7: Severe disc space loss. Circumferential disc osteophyte
complex. Ligament flavum hypertrophy. Moderate to severe facet
hypertrophy. No significant spinal stenosis suspected. Moderate
bilateral C7 foraminal stenosis greater on the left.

C7-T1:  Mild to moderate facet hypertrophy.  No stenosis.

T1-T2:  Mild facet and uncovertebral hypertrophy.  No stenosis.
IMPRESSION: 1. 5-6 mm soft tissue appearing focus at the left lateral wall of
the trachea at the thoracic inlet. This resembles a small tracheal
polyp rather than retained secretions. Consider direct
visualization.
2. Chronic degenerative appearing cervical spine ankylosis from the
C3 to the C6 level. This may have been present in 5003.
3. Subsequent severe adjacent segment disease, in severe in
significantly progressed degenerative arthritis at the left C1-C2
articulation. This latter finding might be the symptomatic
abnormality.
4. No significant spinal stenosis suspected, but there is moderate
multifactorial neural foraminal stenosis at the right C4, right C6,
and bilateral C7 nerve levels.

## 2020-03-18 DIAGNOSIS — F411 Generalized anxiety disorder: Secondary | ICD-10-CM | POA: Diagnosis not present

## 2020-03-18 DIAGNOSIS — G8929 Other chronic pain: Secondary | ICD-10-CM | POA: Diagnosis not present

## 2020-03-18 DIAGNOSIS — I10 Essential (primary) hypertension: Secondary | ICD-10-CM | POA: Diagnosis not present

## 2020-03-18 DIAGNOSIS — M545 Low back pain: Secondary | ICD-10-CM | POA: Diagnosis not present

## 2020-03-22 DIAGNOSIS — M545 Low back pain: Secondary | ICD-10-CM | POA: Diagnosis not present

## 2020-03-22 DIAGNOSIS — M5136 Other intervertebral disc degeneration, lumbar region: Secondary | ICD-10-CM | POA: Diagnosis not present

## 2020-03-24 DIAGNOSIS — M545 Low back pain: Secondary | ICD-10-CM | POA: Diagnosis not present

## 2020-03-29 DIAGNOSIS — M545 Low back pain: Secondary | ICD-10-CM | POA: Diagnosis not present

## 2020-04-07 DIAGNOSIS — M48061 Spinal stenosis, lumbar region without neurogenic claudication: Secondary | ICD-10-CM | POA: Diagnosis not present

## 2020-04-18 DIAGNOSIS — L0390 Cellulitis, unspecified: Secondary | ICD-10-CM | POA: Diagnosis not present

## 2020-04-18 DIAGNOSIS — D485 Neoplasm of uncertain behavior of skin: Secondary | ICD-10-CM | POA: Diagnosis not present

## 2020-04-21 DIAGNOSIS — M48061 Spinal stenosis, lumbar region without neurogenic claudication: Secondary | ICD-10-CM | POA: Diagnosis not present

## 2020-04-30 DIAGNOSIS — N390 Urinary tract infection, site not specified: Secondary | ICD-10-CM | POA: Diagnosis not present

## 2020-04-30 DIAGNOSIS — R112 Nausea with vomiting, unspecified: Secondary | ICD-10-CM | POA: Diagnosis not present

## 2020-04-30 DIAGNOSIS — R1030 Lower abdominal pain, unspecified: Secondary | ICD-10-CM | POA: Diagnosis not present

## 2020-04-30 DIAGNOSIS — R197 Diarrhea, unspecified: Secondary | ICD-10-CM | POA: Diagnosis not present

## 2020-05-02 DIAGNOSIS — M0579 Rheumatoid arthritis with rheumatoid factor of multiple sites without organ or systems involvement: Secondary | ICD-10-CM | POA: Diagnosis not present

## 2020-05-02 DIAGNOSIS — D0471 Carcinoma in situ of skin of right lower limb, including hip: Secondary | ICD-10-CM | POA: Diagnosis not present

## 2020-05-02 DIAGNOSIS — I10 Essential (primary) hypertension: Secondary | ICD-10-CM | POA: Diagnosis not present

## 2020-05-02 DIAGNOSIS — L0390 Cellulitis, unspecified: Secondary | ICD-10-CM | POA: Diagnosis not present

## 2020-05-02 DIAGNOSIS — G4700 Insomnia, unspecified: Secondary | ICD-10-CM | POA: Diagnosis not present

## 2020-05-17 DIAGNOSIS — W19XXXA Unspecified fall, initial encounter: Secondary | ICD-10-CM | POA: Diagnosis not present

## 2020-05-17 DIAGNOSIS — Y92009 Unspecified place in unspecified non-institutional (private) residence as the place of occurrence of the external cause: Secondary | ICD-10-CM | POA: Diagnosis not present

## 2020-05-17 DIAGNOSIS — I10 Essential (primary) hypertension: Secondary | ICD-10-CM | POA: Diagnosis not present

## 2020-05-17 DIAGNOSIS — I739 Peripheral vascular disease, unspecified: Secondary | ICD-10-CM | POA: Diagnosis not present

## 2020-05-17 DIAGNOSIS — T148XXA Other injury of unspecified body region, initial encounter: Secondary | ICD-10-CM | POA: Diagnosis not present

## 2020-05-17 DIAGNOSIS — S8991XA Unspecified injury of right lower leg, initial encounter: Secondary | ICD-10-CM | POA: Diagnosis not present

## 2020-05-25 DIAGNOSIS — J439 Emphysema, unspecified: Secondary | ICD-10-CM | POA: Diagnosis not present

## 2020-05-25 DIAGNOSIS — J449 Chronic obstructive pulmonary disease, unspecified: Secondary | ICD-10-CM | POA: Diagnosis not present

## 2020-05-25 DIAGNOSIS — J9611 Chronic respiratory failure with hypoxia: Secondary | ICD-10-CM | POA: Diagnosis not present

## 2020-05-25 DIAGNOSIS — Z87891 Personal history of nicotine dependence: Secondary | ICD-10-CM | POA: Diagnosis not present

## 2020-05-31 DIAGNOSIS — L570 Actinic keratosis: Secondary | ICD-10-CM | POA: Diagnosis not present

## 2020-05-31 DIAGNOSIS — D0471 Carcinoma in situ of skin of right lower limb, including hip: Secondary | ICD-10-CM | POA: Diagnosis not present

## 2020-05-31 DIAGNOSIS — L720 Epidermal cyst: Secondary | ICD-10-CM | POA: Diagnosis not present

## 2020-06-02 DIAGNOSIS — J449 Chronic obstructive pulmonary disease, unspecified: Secondary | ICD-10-CM | POA: Diagnosis not present

## 2020-06-09 DIAGNOSIS — S81801S Unspecified open wound, right lower leg, sequela: Secondary | ICD-10-CM | POA: Diagnosis not present

## 2020-06-09 DIAGNOSIS — I10 Essential (primary) hypertension: Secondary | ICD-10-CM | POA: Diagnosis not present

## 2020-06-09 DIAGNOSIS — H1131 Conjunctival hemorrhage, right eye: Secondary | ICD-10-CM | POA: Diagnosis not present

## 2020-06-14 DIAGNOSIS — D0471 Carcinoma in situ of skin of right lower limb, including hip: Secondary | ICD-10-CM | POA: Diagnosis not present

## 2020-06-24 DIAGNOSIS — Z130 Encounter for screening for diseases of the blood and blood-forming organs and certain disorders involving the immune mechanism: Secondary | ICD-10-CM | POA: Diagnosis not present

## 2020-06-24 DIAGNOSIS — Z13228 Encounter for screening for other metabolic disorders: Secondary | ICD-10-CM | POA: Diagnosis not present

## 2020-06-24 DIAGNOSIS — Z1329 Encounter for screening for other suspected endocrine disorder: Secondary | ICD-10-CM | POA: Diagnosis not present

## 2020-06-24 DIAGNOSIS — E785 Hyperlipidemia, unspecified: Secondary | ICD-10-CM | POA: Diagnosis not present

## 2020-06-24 DIAGNOSIS — J449 Chronic obstructive pulmonary disease, unspecified: Secondary | ICD-10-CM | POA: Diagnosis not present

## 2020-06-24 DIAGNOSIS — E7800 Pure hypercholesterolemia, unspecified: Secondary | ICD-10-CM | POA: Diagnosis not present

## 2020-06-24 DIAGNOSIS — M810 Age-related osteoporosis without current pathological fracture: Secondary | ICD-10-CM | POA: Diagnosis not present

## 2020-06-24 DIAGNOSIS — I10 Essential (primary) hypertension: Secondary | ICD-10-CM | POA: Diagnosis not present

## 2020-06-24 DIAGNOSIS — Z0000 Encounter for general adult medical examination without abnormal findings: Secondary | ICD-10-CM | POA: Diagnosis not present

## 2020-07-02 DIAGNOSIS — J449 Chronic obstructive pulmonary disease, unspecified: Secondary | ICD-10-CM | POA: Diagnosis not present

## 2020-07-19 DIAGNOSIS — D0471 Carcinoma in situ of skin of right lower limb, including hip: Secondary | ICD-10-CM | POA: Diagnosis not present

## 2020-07-28 DIAGNOSIS — J029 Acute pharyngitis, unspecified: Secondary | ICD-10-CM | POA: Diagnosis not present

## 2020-08-02 DIAGNOSIS — J449 Chronic obstructive pulmonary disease, unspecified: Secondary | ICD-10-CM | POA: Diagnosis not present

## 2020-08-16 DIAGNOSIS — F411 Generalized anxiety disorder: Secondary | ICD-10-CM | POA: Diagnosis not present

## 2020-08-16 DIAGNOSIS — M069 Rheumatoid arthritis, unspecified: Secondary | ICD-10-CM | POA: Diagnosis not present

## 2020-08-16 DIAGNOSIS — J441 Chronic obstructive pulmonary disease with (acute) exacerbation: Secondary | ICD-10-CM | POA: Diagnosis not present

## 2020-08-16 DIAGNOSIS — I10 Essential (primary) hypertension: Secondary | ICD-10-CM | POA: Diagnosis not present

## 2020-08-23 DIAGNOSIS — I10 Essential (primary) hypertension: Secondary | ICD-10-CM | POA: Diagnosis not present

## 2020-08-30 DIAGNOSIS — Z859 Personal history of malignant neoplasm, unspecified: Secondary | ICD-10-CM | POA: Diagnosis not present

## 2020-09-23 DIAGNOSIS — M0579 Rheumatoid arthritis with rheumatoid factor of multiple sites without organ or systems involvement: Secondary | ICD-10-CM | POA: Diagnosis not present

## 2020-09-23 DIAGNOSIS — J441 Chronic obstructive pulmonary disease with (acute) exacerbation: Secondary | ICD-10-CM | POA: Diagnosis not present

## 2020-09-23 DIAGNOSIS — I10 Essential (primary) hypertension: Secondary | ICD-10-CM | POA: Diagnosis not present

## 2021-01-26 ENCOUNTER — Other Ambulatory Visit: Payer: Self-pay | Admitting: Specialist

## 2021-01-26 DIAGNOSIS — R0602 Shortness of breath: Secondary | ICD-10-CM

## 2021-02-23 ENCOUNTER — Ambulatory Visit
Admission: RE | Admit: 2021-02-23 | Discharge: 2021-02-23 | Disposition: A | Discharge status: Home / Self Care | Payer: Medicare HMO | Source: Ambulatory Visit | Attending: Specialist | Admitting: Specialist

## 2021-02-23 ENCOUNTER — Other Ambulatory Visit: Payer: Self-pay

## 2021-02-23 DIAGNOSIS — I252 Old myocardial infarction: Secondary | ICD-10-CM | POA: Diagnosis not present

## 2021-02-23 DIAGNOSIS — I082 Rheumatic disorders of both aortic and tricuspid valves: Secondary | ICD-10-CM | POA: Insufficient documentation

## 2021-02-23 DIAGNOSIS — I10 Essential (primary) hypertension: Secondary | ICD-10-CM | POA: Insufficient documentation

## 2021-02-23 DIAGNOSIS — J449 Chronic obstructive pulmonary disease, unspecified: Secondary | ICD-10-CM | POA: Diagnosis not present

## 2021-02-23 DIAGNOSIS — R0602 Shortness of breath: Secondary | ICD-10-CM | POA: Diagnosis present

## 2021-02-23 NOTE — Progress Notes (Signed)
Requested to Echo Lab to assist with Echo Bubble Study by Sherrie Sport. Confirmed patient MRN, DOB, started PIV to left forearm on first attempt, completed Echo Bubble Study with [facility], discontinued PIV with healthy site and no bleeding. Gauze/tape dressing applied.  Patient tolerated without issues.

## 2021-02-23 NOTE — Progress Notes (Signed)
*  PRELIMINARY RESULTS* Echocardiogram 2D Echocardiogram has been performed.  [name] 02/23/2021, 11:42 AM

## 2021-02-24 LAB — ECHOCARDIOGRAM COMPLETE BUBBLE STUDY
AR max vel: 1.56 cm2
AV Area VTI: 1.71 cm2
AV Area mean vel: 1.49 cm2
AV Mean grad: 9 mmHg
AV Peak grad: 17.2 mmHg
Ao pk vel: 2.07 m/s
Area-P 1/2: 3.13 cm2
P 1/2 time: 560 msec
S' Lateral: 2.44 cm

## 2021-04-07 ENCOUNTER — Other Ambulatory Visit: Payer: Self-pay

## 2021-04-07 ENCOUNTER — Emergency Department
Admission: EM | Admit: 2021-04-07 | Discharge: 2021-04-07 | Disposition: A | Discharge status: Home / Self Care | Payer: Medicare HMO | Attending: Physician Assistant | Admitting: Physician Assistant

## 2021-04-07 ENCOUNTER — Encounter: Payer: Self-pay | Admitting: Emergency Medicine

## 2021-04-07 DIAGNOSIS — Z79899 Other long term (current) drug therapy: Secondary | ICD-10-CM | POA: Diagnosis not present

## 2021-04-07 DIAGNOSIS — I509 Heart failure, unspecified: Secondary | ICD-10-CM | POA: Diagnosis not present

## 2021-04-07 DIAGNOSIS — M069 Rheumatoid arthritis, unspecified: Secondary | ICD-10-CM | POA: Diagnosis not present

## 2021-04-07 DIAGNOSIS — R233 Spontaneous ecchymoses: Secondary | ICD-10-CM | POA: Insufficient documentation

## 2021-04-07 DIAGNOSIS — J449 Chronic obstructive pulmonary disease, unspecified: Secondary | ICD-10-CM | POA: Diagnosis not present

## 2021-04-07 DIAGNOSIS — R112 Nausea with vomiting, unspecified: Secondary | ICD-10-CM | POA: Insufficient documentation

## 2021-04-07 DIAGNOSIS — Z87891 Personal history of nicotine dependence: Secondary | ICD-10-CM | POA: Diagnosis not present

## 2021-04-07 DIAGNOSIS — I252 Old myocardial infarction: Secondary | ICD-10-CM | POA: Insufficient documentation

## 2021-04-07 DIAGNOSIS — R1110 Vomiting, unspecified: Secondary | ICD-10-CM | POA: Diagnosis present

## 2021-04-07 DIAGNOSIS — I110 Hypertensive heart disease with heart failure: Secondary | ICD-10-CM | POA: Insufficient documentation

## 2021-04-07 LAB — CBC WITH DIFFERENTIAL/PLATELET
Abs Immature Granulocytes: 0.04 10*3/uL (ref 0.00–0.07)
Basophils Absolute: 0.1 10*3/uL (ref 0.0–0.1)
Basophils Relative: 1 %
Eosinophils Absolute: 0.1 10*3/uL (ref 0.0–0.5)
Eosinophils Relative: 1 %
HCT: 32.7 % — ABNORMAL LOW (ref 36.0–46.0)
Hemoglobin: 10.9 g/dL — ABNORMAL LOW (ref 12.0–15.0)
Immature Granulocytes: 1 %
Lymphocytes Relative: 10 %
Lymphs Abs: 0.8 10*3/uL (ref 0.7–4.0)
MCH: 29.1 pg (ref 26.0–34.0)
MCHC: 33.3 g/dL (ref 30.0–36.0)
MCV: 87.4 fL (ref 80.0–100.0)
Monocytes Absolute: 0.4 10*3/uL (ref 0.1–1.0)
Monocytes Relative: 5 %
Neutro Abs: 6.5 10*3/uL (ref 1.7–7.7)
Neutrophils Relative %: 82 %
Platelets: 330 10*3/uL (ref 150–400)
RBC: 3.74 MIL/uL — ABNORMAL LOW (ref 3.87–5.11)
RDW: 12.6 % (ref 11.5–15.5)
WBC: 7.8 10*3/uL (ref 4.0–10.5)
nRBC: 0 % (ref 0.0–0.2)

## 2021-04-07 LAB — BASIC METABOLIC PANEL
Anion gap: 8 (ref 5–15)
BUN: 17 mg/dL (ref 8–23)
CO2: 26 mmol/L (ref 22–32)
Calcium: 9 mg/dL (ref 8.9–10.3)
Chloride: 98 mmol/L (ref 98–111)
Creatinine, Ser: 0.84 mg/dL (ref 0.44–1.00)
GFR, Estimated: >60 mL/min (ref >60)
Glucose, Bld: 106 mg/dL — ABNORMAL HIGH (ref 70–99)
Potassium: 3.7 mmol/L (ref 3.5–5.1)
Sodium: 132 mmol/L — ABNORMAL LOW (ref 135–145)

## 2021-04-07 LAB — LACTIC ACID, PLASMA: Lactic Acid, Venous: 0.7 mmol/L (ref 0.5–1.9)

## 2021-04-07 MED ORDER — ONDANSETRON 4 MG PO TBDP: 4.0000 mg | ORAL_TABLET | Freq: Three times a day (TID) | ORAL | 0 refills | Status: DC | PRN

## 2021-04-07 MED ORDER — SODIUM CHLORIDE 0.9 % IV BOLUS
500.0000 mL | Freq: Once | INTRAVENOUS | Status: AC
  Administered 2021-04-07: 500 mL via INTRAVENOUS

## 2021-04-07 MED ORDER — ONDANSETRON HCL 4 MG/2ML IJ SOLN
4.0000 mg | Freq: Once | INTRAMUSCULAR | Status: AC
  Administered 2021-04-07: 4 mg via INTRAVENOUS
  Filled 2021-04-07: qty 2

## 2021-04-07 NOTE — ED Provider Notes (Signed)
Emergency Medicine Provider Triage Evaluation Note  [name] , a 87 y.o. female  was evaluated in triage.  Pt complains of concern for sepsis from [facility]. Patient experienced a sudden episode of emesis, right after eating breakfast this morning. She had some strong retching, and developed some petechia to the face. [facility] was concerned for possible sepsis when she presented. She denies FCS, CP, SOB.   Review of Systems  Positive: N/V Negative: FCS, abdominal pain  Physical Exam  BP (!) 182/64   Pulse 71   Temp 98.9 F (37.2 C) (Oral)   Resp 18   Ht 5' (1.524 m)   Wt 49.9 kg   SpO2 90%   BMI 21.48 kg/m  Gen:   Awake, no distress  NAD Resp:  Normal effort CTA MSK:   Moves extremities without difficulty. BLE coban dressings in place Other:  Skin: petechia noted to face/neck  Medical Decision Making  Medically screening exam initiated at 5:01 PM.  Appropriate orders placed.  [name] was informed that the remainder of the evaluation will be completed by another provider, this initial triage assessment does not replace that evaluation, and the importance of remaining in the ED until their evaluation is complete.  Patient with ED evaluation recent episode of emesis, presenting for [facility] provider concern for sepsis.    Melvenia Needles, PA-C 04/07/21 1721    Lucrezia Starch, MD 04/08/21 [phone]

## 2021-04-07 NOTE — Discharge Instructions (Signed)
Your exam and labs are normal at this time. You have some small eruptions to the superficial blood vessels of the skin, associated with vomiting and retching. They will resolve without treatment. Follow-up with your provider as needed.

## 2021-04-07 NOTE — ED Triage Notes (Signed)
Pt to ER after being referred from [facility].  Pt had surgery to bilateral legs to remove lesions approximately 1 week ago.  Pt states this AM she vomited and after noted red spots on her face.  When she was seen at [facility] they told her to come to ER for evaluation of possible spread of infection and concern for sepsis despite normal VS.

## 2021-04-11 ENCOUNTER — Emergency Department: Payer: Medicare HMO

## 2021-04-11 ENCOUNTER — Inpatient Hospital Stay
Admission: EM | Admit: 2021-04-11 | Discharge: 2021-04-13 | DRG: 190 | Disposition: A | Discharge status: Home / Self Care | Payer: Medicare HMO | Attending: Internal Medicine | Admitting: Internal Medicine

## 2021-04-11 DIAGNOSIS — I110 Hypertensive heart disease with heart failure: Secondary | ICD-10-CM | POA: Diagnosis present

## 2021-04-11 DIAGNOSIS — I5031 Acute diastolic (congestive) heart failure: Secondary | ICD-10-CM

## 2021-04-11 DIAGNOSIS — J9601 Acute respiratory failure with hypoxia: Secondary | ICD-10-CM

## 2021-04-11 DIAGNOSIS — Z79899 Other long term (current) drug therapy: Secondary | ICD-10-CM

## 2021-04-11 DIAGNOSIS — Z886 Allergy status to analgesic agent status: Secondary | ICD-10-CM

## 2021-04-11 DIAGNOSIS — M810 Age-related osteoporosis without current pathological fracture: Secondary | ICD-10-CM | POA: Diagnosis present

## 2021-04-11 DIAGNOSIS — R7301 Impaired fasting glucose: Secondary | ICD-10-CM | POA: Diagnosis present

## 2021-04-11 DIAGNOSIS — R531 Weakness: Secondary | ICD-10-CM

## 2021-04-11 DIAGNOSIS — J441 Chronic obstructive pulmonary disease with (acute) exacerbation: Principal | ICD-10-CM

## 2021-04-11 DIAGNOSIS — R0602 Shortness of breath: Secondary | ICD-10-CM

## 2021-04-11 DIAGNOSIS — R000 Tachycardia, unspecified: Secondary | ICD-10-CM

## 2021-04-11 DIAGNOSIS — Z885 Allergy status to narcotic agent status: Secondary | ICD-10-CM

## 2021-04-11 DIAGNOSIS — R7989 Other specified abnormal findings of blood chemistry: Secondary | ICD-10-CM

## 2021-04-11 DIAGNOSIS — R739 Hyperglycemia, unspecified: Secondary | ICD-10-CM | POA: Diagnosis present

## 2021-04-11 DIAGNOSIS — K219 Gastro-esophageal reflux disease without esophagitis: Secondary | ICD-10-CM | POA: Diagnosis present

## 2021-04-11 DIAGNOSIS — M069 Rheumatoid arthritis, unspecified: Secondary | ICD-10-CM | POA: Diagnosis present

## 2021-04-11 DIAGNOSIS — F419 Anxiety disorder, unspecified: Secondary | ICD-10-CM

## 2021-04-11 DIAGNOSIS — I10 Essential (primary) hypertension: Secondary | ICD-10-CM

## 2021-04-11 DIAGNOSIS — Z85820 Personal history of malignant melanoma of skin: Secondary | ICD-10-CM

## 2021-04-11 DIAGNOSIS — R0600 Dyspnea, unspecified: Secondary | ICD-10-CM | POA: Diagnosis present

## 2021-04-11 DIAGNOSIS — I252 Old myocardial infarction: Secondary | ICD-10-CM

## 2021-04-11 DIAGNOSIS — Z7951 Long term (current) use of inhaled steroids: Secondary | ICD-10-CM

## 2021-04-11 DIAGNOSIS — Z20822 Contact with and (suspected) exposure to covid-19: Secondary | ICD-10-CM | POA: Diagnosis present

## 2021-04-11 DIAGNOSIS — D75839 Thrombocytosis, unspecified: Secondary | ICD-10-CM | POA: Diagnosis present

## 2021-04-11 DIAGNOSIS — J9621 Acute and chronic respiratory failure with hypoxia: Secondary | ICD-10-CM | POA: Diagnosis present

## 2021-04-11 DIAGNOSIS — F32A Depression, unspecified: Secondary | ICD-10-CM | POA: Diagnosis present

## 2021-04-11 DIAGNOSIS — I2510 Atherosclerotic heart disease of native coronary artery without angina pectoris: Secondary | ICD-10-CM | POA: Diagnosis present

## 2021-04-11 DIAGNOSIS — I479 Paroxysmal tachycardia, unspecified: Secondary | ICD-10-CM

## 2021-04-11 DIAGNOSIS — Z87891 Personal history of nicotine dependence: Secondary | ICD-10-CM

## 2021-04-11 LAB — BASIC METABOLIC PANEL
Anion gap: 9 (ref 5–15)
BUN: 20 mg/dL (ref 8–23)
CO2: 29 mmol/L (ref 22–32)
Calcium: 9.6 mg/dL (ref 8.9–10.3)
Chloride: 97 mmol/L — ABNORMAL LOW (ref 98–111)
Creatinine, Ser: 0.95 mg/dL (ref 0.44–1.00)
GFR, Estimated: 58 mL/min — ABNORMAL LOW (ref >60)
Glucose, Bld: 136 mg/dL — ABNORMAL HIGH (ref 70–99)
Potassium: 4.1 mmol/L (ref 3.5–5.1)
Sodium: 135 mmol/L (ref 135–145)

## 2021-04-11 LAB — CBC
HCT: 36.1 % (ref 36.0–46.0)
Hemoglobin: 11.8 g/dL — ABNORMAL LOW (ref 12.0–15.0)
MCH: 29.1 pg (ref 26.0–34.0)
MCHC: 32.7 g/dL (ref 30.0–36.0)
MCV: 88.9 fL (ref 80.0–100.0)
Platelets: 433 10*3/uL — ABNORMAL HIGH (ref 150–400)
RBC: 4.06 MIL/uL (ref 3.87–5.11)
RDW: 13.2 % (ref 11.5–15.5)
WBC: 9.4 10*3/uL (ref 4.0–10.5)
nRBC: 0 % (ref 0.0–0.2)

## 2021-04-11 LAB — MAGNESIUM: Magnesium: 1.8 mg/dL (ref 1.7–2.4)

## 2021-04-11 LAB — RESP PANEL BY RT-PCR (FLU A&B, COVID) ARPGX2
Influenza A by PCR: NEGATIVE
Influenza B by PCR: NEGATIVE
SARS Coronavirus 2 by RT PCR: NEGATIVE

## 2021-04-11 LAB — URINALYSIS, COMPLETE (UACMP) WITH MICROSCOPIC
Bacteria, UA: NONE SEEN
Bilirubin Urine: NEGATIVE
Glucose, UA: NEGATIVE mg/dL
Hgb urine dipstick: NEGATIVE
Ketones, ur: 5 mg/dL — AB
Leukocytes,Ua: NEGATIVE
Nitrite: NEGATIVE
Protein, ur: 30 mg/dL — AB
Specific Gravity, Urine: 1.032 — ABNORMAL HIGH (ref 1.005–1.030)
Squamous Epithelial / HPF: NONE SEEN (ref 0–5)
pH: 7 (ref 5.0–8.0)

## 2021-04-11 LAB — TROPONIN I (HIGH SENSITIVITY)
Troponin I (High Sensitivity): 57 ng/L — ABNORMAL HIGH (ref <18)
Troponin I (High Sensitivity): 59 ng/L — ABNORMAL HIGH (ref <18)

## 2021-04-11 LAB — BRAIN NATRIURETIC PEPTIDE: B Natriuretic Peptide: 1878 pg/mL — ABNORMAL HIGH (ref 0.0–100.0)

## 2021-04-11 LAB — PROTIME-INR
INR: 1.1 (ref 0.8–1.2)
Prothrombin Time: 14.2 seconds (ref 11.4–15.2)

## 2021-04-11 LAB — LACTIC ACID, PLASMA: Lactic Acid, Venous: 1.3 mmol/L (ref 0.5–1.9)

## 2021-04-11 LAB — APTT: aPTT: 45 seconds — ABNORMAL HIGH (ref 24–36)

## 2021-04-11 MED ORDER — IOHEXOL 350 MG/ML SOLN
75.0000 mL | Freq: Once | INTRAVENOUS | Status: AC | PRN
  Administered 2021-04-11: 75 mL via INTRAVENOUS

## 2021-04-11 MED ORDER — LOSARTAN POTASSIUM 50 MG PO TABS
50.0000 mg | ORAL_TABLET | Freq: Every day | ORAL | Status: DC
  Administered 2021-04-11: 50 mg via ORAL
  Filled 2021-04-11: qty 1

## 2021-04-11 MED ORDER — METOPROLOL SUCCINATE ER 100 MG PO TB24
100.0000 mg | ORAL_TABLET | Freq: Every day | ORAL | Status: DC
  Administered 2021-04-11: 100 mg via ORAL
  Filled 2021-04-11: qty 2

## 2021-04-11 MED ORDER — PANTOPRAZOLE SODIUM 40 MG PO TBEC
40.0000 mg | DELAYED_RELEASE_TABLET | Freq: Every day | ORAL | Status: DC
  Administered 2021-04-11 – 2021-04-13 (×3): 40 mg via ORAL
  Filled 2021-04-11 (×3): qty 1

## 2021-04-11 MED ORDER — LEVALBUTEROL HCL 1.25 MG/0.5ML IN NEBU
1.2500 mg | INHALATION_SOLUTION | Freq: Once | RESPIRATORY_TRACT | Status: AC
  Administered 2021-04-11: 1.25 mg via RESPIRATORY_TRACT
  Filled 2021-04-11: qty 0.5

## 2021-04-11 MED ORDER — ACETAMINOPHEN 325 MG PO TABS
650.0000 mg | ORAL_TABLET | Freq: Four times a day (QID) | ORAL | Status: DC | PRN
  Administered 2021-04-11 – 2021-04-12 (×2): 650 mg via ORAL
  Filled 2021-04-11 (×2): qty 2

## 2021-04-11 MED ORDER — ENOXAPARIN SODIUM 40 MG/0.4ML IJ SOSY
40.0000 mg | PREFILLED_SYRINGE | INTRAMUSCULAR | Status: DC
  Administered 2021-04-11 – 2021-04-12 (×2): 40 mg via SUBCUTANEOUS
  Filled 2021-04-11 (×2): qty 0.4

## 2021-04-11 MED ORDER — OXYCODONE-ACETAMINOPHEN 5-325 MG PO TABS
1.0000 | ORAL_TABLET | Freq: Four times a day (QID) | ORAL | Status: DC | PRN
  Administered 2021-04-12: 1 via ORAL
  Filled 2021-04-11: qty 1

## 2021-04-11 MED ORDER — LEVALBUTEROL HCL 0.63 MG/3ML IN NEBU
0.6300 mg | INHALATION_SOLUTION | Freq: Four times a day (QID) | RESPIRATORY_TRACT | Status: DC | PRN
  Filled 2021-04-11: qty 3

## 2021-04-11 MED ORDER — GABAPENTIN 100 MG PO CAPS
200.0000 mg | ORAL_CAPSULE | Freq: Two times a day (BID) | ORAL | Status: DC
  Administered 2021-04-11 – 2021-04-13 (×4): 200 mg via ORAL
  Filled 2021-04-11 (×4): qty 2

## 2021-04-11 MED ORDER — HYDROCHLOROTHIAZIDE 25 MG PO TABS
25.0000 mg | ORAL_TABLET | Freq: Every day | ORAL | Status: DC
  Administered 2021-04-12: 25 mg via ORAL
  Filled 2021-04-11: qty 1

## 2021-04-11 MED ORDER — DOCUSATE SODIUM 100 MG PO CAPS: 200.0000 mg | ORAL_CAPSULE | Freq: Two times a day (BID) | ORAL | Status: DC | PRN

## 2021-04-11 MED ORDER — METHYLPREDNISOLONE SODIUM SUCC 125 MG IJ SOLR
125.0000 mg | INTRAMUSCULAR | Status: AC
  Administered 2021-04-11: 125 mg via INTRAVENOUS
  Filled 2021-04-11: qty 2

## 2021-04-11 MED ORDER — ONDANSETRON HCL 4 MG/2ML IJ SOLN: 4.0000 mg | Freq: Three times a day (TID) | INTRAMUSCULAR | Status: DC | PRN

## 2021-04-11 NOTE — ED Notes (Signed)
Informed MD of elevated BP

## 2021-04-11 NOTE — ED Provider Notes (Signed)
[facility] Emergency Department Provider Note   ____________________________________________   Event Date/Time   First MD Initiated Contact with Patient 04/11/21 1533     (approximate)  I have reviewed the triage vital signs and the nursing notes.   HISTORY  Chief Complaint Chest Pain    HPI [name] is a 87 y.o. female here for evaluation of shortness of breath some associated chest pain as well  Orts that surgery on her left leg about 1 week ago for removal of melanoma, then her right leg about 1 week prior to that.  She is been on doxycycline which she completed 2-week course of yesterday  About 3 days ago she had a episode of severe nausea and vomiting that woke her during the middle of the night she went to urgent care the next day was seen here in our ER went home and felt improved from standpoint of abdominal pain, but she has noticed that since then she has also been having some shortness of breath chest pain  Denies history any blood clots.  She uses 4 L oxygen at home, but even at home her oxygen levels were "dipping down into the 70s" prompted him to come for evaluation  Denies fever no cough.  Has a history of COPD and she reports that she will have coughing and wheezing when that is causing problems but she is not been experiencing those symptoms.  Denies history of CHF.  No numbness or cold feet in her legs bilateral.  Feels like she is healing well after surgery.  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   Rheumatoid arthritis ([place]) 02/22/2013   Chest pain 02/22/2013   Diarrhea 02/22/2013    PERIPHERAL VASCULAR DISEASE 05/20/2007   HYPERLIPIDEMIA 05/19/2007   HYPERTENSION 05/19/2007   MYOCARDIAL INFARCTION, HX OF 05/19/2007   CORONARY ARTERY DISEASE 05/19/2007   ASTHMA 05/19/2007   COPD 05/19/2007   GERD 05/19/2007   OSTEOPOROSIS 05/19/2007    Past Surgical History:  Procedure Laterality Date   ABDOMINAL HYSTERECTOMY     BREAST SURGERY     CARDIAC SURGERY     CARPAL TUNNEL RELEASE     MELANOMA EXCISION  2007   scalp   MELANOMA EXCISION Right 2013   lower leg   SHOULDER SURGERY     TOTAL HIP REVISION  2015   VIDEO BRONCHOSCOPY Bilateral 04/08/2014   Procedure: VIDEO BRONCHOSCOPY WITHOUT FLUORO;  Surgeon: Juanito Doom, MD;  Location: [facility] ENDOSCOPY;  Service: Cardiopulmonary;  Laterality: Bilateral;    Prior to Admission medications   Medication Sig Start Date End Date Taking? Authorizing Provider  acetaminophen-codeine (TYLENOL #3) 300-30 MG tablet Take 1 tablet by mouth every 4 (four) hours as needed. 04/05/21  Yes [provider]  albuterol (PROVENTIL) (2.5 MG/3ML) 0.083% nebulizer solution Take 2.5 mg by nebulization every 6 (six) hours as needed for wheezing or shortness of breath.   Yes [provider]  Cholecalciferol (VITAMIN D PO) Take 25 mcg by mouth daily.   Yes [provider]  fish oil-omega-3  fatty acids 1000 MG capsule Take 2 g by mouth daily.   Yes [provider]  fluticasone furoate-vilanterol (BREO ELLIPTA) 100-25 MCG/INH AEPB Inhale 1 puff into the lungs daily.   Yes [provider]  hydrochlorothiazide (HYDRODIURIL) 25 MG tablet Take 25 mg by mouth daily.   Yes [provider]  ipratropium-albuterol (DUONEB) 0.5-2.5 (3) MG/3ML SOLN Take 3 mLs by nebulization every 6 (six) hours as needed.   Yes [provider]  lisinopril (ZESTRIL) 40 MG tablet Take 40 mg by mouth daily. 04/03/21  Yes [provider]  nitroGLYCERIN (NITROSTAT) 0.4 MG SL tablet Place 1 tablet (0.4 mg total)  under the tongue every 5 (five) minutes as needed for chest pain. 02/23/13  Yes Ivor Costa, MD  sucralfate (CARAFATE) 1 g tablet Take 1 g by mouth 4 (four) times daily. 03/07/21  Yes [provider]  TRELEGY ELLIPTA 100-62.5-25 MCG/INH AEPB Take 1 puff by mouth daily. 03/13/21  Yes [provider]  albuterol-ipratropium (COMBIVENT) 18-103 MCG/ACT inhaler Inhale 2 puffs into the lungs every 6 (six) hours as needed for wheezing. Patient not taking: Reported on 04/11/2021    [provider]  cloNIDine (CATAPRES) 0.1 MG tablet Take 0.1 mg by mouth 2 (two) times daily. Patient not taking: Reported on 04/11/2021 03/05/21   [provider]  losartan (COZAAR) 50 MG tablet Take 50 mg by mouth daily. Patient not taking: Reported on 04/11/2021    [provider]  metoprolol succinate (TOPROL-XL) 100 MG 24 hr tablet Take 100 mg by mouth 2 (two) times daily. Take with or immediately following a meal. Patient not taking: Reported on 04/11/2021    [provider]  ondansetron (ZOFRAN ODT) 4 MG disintegrating tablet Take 1 tablet (4 mg total) by mouth every 8 (eight) hours as needed. Patient not taking: Reported on 04/11/2021 04/07/21   Menshew, Dannielle Karvonen, PA-C    Allergies Celebrex [celecoxib] and Morphine and related  No family history on file.  Social History Social History   Tobacco Use   Smoking status: Former    Packs/day: 1.50    Years: 22.00    Pack years: 33.00    Types: Cigarettes    Quit date: 04/07/1974    Years since quitting: 47.0   Smokeless tobacco: Never  Substance Use Topics   Alcohol use: No   Drug use: No    Review of Systems Constitutional: No fever/chills Eyes: No visual changes. ENT: No sore throat. Cardiovascular: D see HPI Respiratory: See HPI. Gastrointestinal: See HPI.  All abdominal symptoms have resolved about 2 days ago Genitourinary: Negative for dysuria. Musculoskeletal: Negative for back pain. Skin: Negative for  rash. Neurological: Negative for headaches,  focal weakness or numbness.    ____________________________________________   PHYSICAL EXAM:  VITAL SIGNS: ED Triage Vitals  Enc Vitals Group     BP 04/11/21 1524 (!) 189/112     Pulse Rate 04/11/21 1515 (!) 146     Resp 04/11/21 1515 (!) 26     Temp 04/11/21 1524 98.5 F (36.9 C)     Temp Source 04/11/21 1524 Oral     SpO2 04/11/21 1514 (!) 71 %     Weight --      Height --      Head Circumference --      Peak Flow --      Pain Score 04/11/21 1524 0     Pain Loc --      Pain Edu? --  Excl. in [place]? --     Constitutional: Alert and oriented.  No distress but appears mildly ill tachypneic.  He appears fatigued Eyes: Conjunctivae are normal. Head: Atraumatic. Nose: No congestion/rhinnorhea. Mouth/Throat: Mucous membranes are moist. Neck: No stridor.  Cardiovascular: Normal rate, regular rhythm. Grossly normal heart sounds.  Good peripheral circulation.  Patient triage with a heart rate of 145, but now her heart rate has improved sinus rhythm at approximately rate of 75 Respiratory: Normal respiratory effort for slightly increased rate of 20.  No retractions. Lungs CTAB.  No wheezing or crackles.  Speaks in full clear sentences. Gastrointestinal: Soft and nontender. No distention. Musculoskeletal: No lower extremity tenderness nor edema except she does have pretty significant wraps applied to both lower extremities, I removed the outer layer of this and she does not have any evidence of purulence or significant drainage she does have small amounts of dried blood within the white wrapped lower extremity gauze.  Patient and her husband requested not remove the wrap at this time. Neurologic:  Normal speech and language. No gross focal neurologic deficits are appreciated.  Skin:  Skin is warm, dry and intact. No rash noted. Psychiatric: Mood and affect are normal. Speech and behavior are  normal.  ____________________________________________   LABS (all labs ordered are listed, but only abnormal results are displayed)  Labs Reviewed  BASIC METABOLIC PANEL - Abnormal; Notable for the following components:      Result Value   Chloride 97 (*)    Glucose, Bld 136 (*)    GFR, Estimated 58 (*)    All other components within normal limits  CBC - Abnormal; Notable for the following components:   Hemoglobin 11.8 (*)    Platelets 433 (*)    All other components within normal limits  APTT - Abnormal; Notable for the following components:   aPTT 45 (*)    All other components within normal limits  BRAIN NATRIURETIC PEPTIDE - Abnormal; Notable for the following components:   B Natriuretic Peptide 1,878.0 (*)    All other components within normal limits  URINALYSIS, COMPLETE (UACMP) WITH MICROSCOPIC - Abnormal; Notable for the following components:   Color, Urine YELLOW (*)    APPearance CLEAR (*)    Specific Gravity, Urine 1.032 (*)    Ketones, ur 5 (*)    Protein, ur 30 (*)    All other components within normal limits  TROPONIN I (HIGH SENSITIVITY) - Abnormal; Notable for the following components:   Troponin I (High Sensitivity) 57 (*)    All other components within normal limits  TROPONIN I (HIGH SENSITIVITY) - Abnormal; Notable for the following components:   Troponin I (High Sensitivity) 59 (*)    All other components within normal limits  RESP PANEL BY RT-PCR (FLU A&B, COVID) ARPGX2  CULTURE, BLOOD (ROUTINE X 2)  CULTURE, BLOOD (ROUTINE X 2)  PROTIME-INR  LACTIC ACID, PLASMA  CBC  BASIC METABOLIC PANEL  MAGNESIUM  THYROID PANEL WITH TSH   ____________________________________________  EKG  Reviewed inter by me at 1515 Heart rate 145 QRS 100 QTc 440 Probable sinus tachycardia, incomplete right bundle branch block morphology, nonspecific T wave abnormality, T wave inversions are present in couple leads as well as mild biphasic appearance.  Overall EKG is  concerning for possible ischemia but not indicative of STEMI or obvious ACS ____________________________________________  RADIOLOGY  CT Angio Chest PE W and/or Wo Contrast  Result Date: 04/11/2021 CLINICAL DATA:  Intermittent chest pain for 1 week, hypoxia,  increasing shortness of breath, nausea and vomiting, tachypnea EXAM: CT ANGIOGRAPHY CHEST CT ABDOMEN AND PELVIS WITH CONTRAST TECHNIQUE: Multidetector CT imaging of the chest was performed using the standard protocol during bolus administration of intravenous contrast. Multiplanar CT image reconstructions and MIPs were obtained to evaluate the vascular anatomy. Multidetector CT imaging of the abdomen and pelvis was performed using the standard protocol during bolus administration of intravenous contrast. CONTRAST:  42m OMNIPAQUE IOHEXOL 350 MG/ML SOLN COMPARISON:  04/11/2021 FINDINGS: CTA CHEST FINDINGS Cardiovascular: This is a technically adequate evaluation of the pulmonary vasculature. No filling defects or pulmonary emboli. The heart is enlarged without pericardial effusion. Normal caliber of the thoracic aorta. Diffuse atherosclerosis of the aorta and coronary vasculature. Mediastinum/Nodes: No enlarged mediastinal, hilar, or axillary lymph nodes. Thyroid gland, trachea, and esophagus demonstrate no significant findings. Lungs/Pleura: Upper lobe predominant emphysema. No acute airspace disease, effusion, or pneumothorax. Central airways are widely patent. Musculoskeletal: No acute displaced fractures. Multiple prior healed left-sided rib fractures are noted. Bilateral breast prostheses, with evidence of intracapsular rupture bilaterally. Reconstructed images demonstrate no additional findings. Review of the MIP images confirms the above findings. CT ABDOMEN and PELVIS FINDINGS Hepatobiliary: Small hepatic cysts are identified. Otherwise the liver is unremarkable. The gallbladder is grossly normal. No biliary duct dilation. Pancreas: Unremarkable. No  pancreatic ductal dilatation or surrounding inflammatory changes. Spleen: Normal in size without focal abnormality. Adrenals/Urinary Tract: Multiple small left renal cortical cysts are noted, with scattered areas of left renal cortical scarring. The right kidney is unremarkable. No urinary tract calculi or obstructive uropathy. The adrenals and bladder are grossly normal. Stomach/Bowel: No bowel obstruction or ileus. Normal appendix right lower quadrant. No bowel wall thickening or inflammatory change. Vascular/Lymphatic: Extensive atherosclerosis throughout the aorta and its distal branches. No pathologic adenopathy within the abdomen or pelvis. Reproductive: Status post hysterectomy. No adnexal masses. Other: No free fluid or free gas. Small fat containing umbilical and inguinal hernias. No bowel herniation. Musculoskeletal: No acute or destructive bony lesions. Unremarkable right hip arthroplasty. Reconstructed images demonstrate no additional findings. Review of the MIP images confirms the above findings. IMPRESSION: 1. No evidence of pulmonary embolus. 2. No acute intrathoracic, intra-abdominal, or intrapelvic process. 3. Aortic Atherosclerosis (ICD10-I70.0) and Emphysema (ICD10-J43.9). 4. Cardiomegaly. Electronically Signed   By: MRanda NgoM.D.   On: 04/11/2021 17:03   CT ABDOMEN PELVIS W CONTRAST  Result Date: 04/11/2021 CLINICAL DATA:  Intermittent chest pain for 1 week, hypoxia, increasing shortness of breath, nausea and vomiting, tachypnea EXAM: CT ANGIOGRAPHY CHEST CT ABDOMEN AND PELVIS WITH CONTRAST TECHNIQUE: Multidetector CT imaging of the chest was performed using the standard protocol during bolus administration of intravenous contrast. Multiplanar CT image reconstructions and MIPs were obtained to evaluate the vascular anatomy. Multidetector CT imaging of the abdomen and pelvis was performed using the standard protocol during bolus administration of intravenous contrast. CONTRAST:  740m OMNIPAQUE IOHEXOL 350 MG/ML SOLN COMPARISON:  04/11/2021 FINDINGS: CTA CHEST FINDINGS Cardiovascular: This is a technically adequate evaluation of the pulmonary vasculature. No filling defects or pulmonary emboli. The heart is enlarged without pericardial effusion. Normal caliber of the thoracic aorta. Diffuse atherosclerosis of the aorta and coronary vasculature. Mediastinum/Nodes: No enlarged mediastinal, hilar, or axillary lymph nodes. Thyroid gland, trachea, and esophagus demonstrate no significant findings. Lungs/Pleura: Upper lobe predominant emphysema. No acute airspace disease, effusion, or pneumothorax. Central airways are widely patent. Musculoskeletal: No acute displaced fractures. Multiple prior healed left-sided rib fractures are noted. Bilateral breast prostheses, with evidence of intracapsular rupture  bilaterally. Reconstructed images demonstrate no additional findings. Review of the MIP images confirms the above findings. CT ABDOMEN and PELVIS FINDINGS Hepatobiliary: Small hepatic cysts are identified. Otherwise the liver is unremarkable. The gallbladder is grossly normal. No biliary duct dilation. Pancreas: Unremarkable. No pancreatic ductal dilatation or surrounding inflammatory changes. Spleen: Normal in size without focal abnormality. Adrenals/Urinary Tract: Multiple small left renal cortical cysts are noted, with scattered areas of left renal cortical scarring. The right kidney is unremarkable. No urinary tract calculi or obstructive uropathy. The adrenals and bladder are grossly normal. Stomach/Bowel: No bowel obstruction or ileus. Normal appendix right lower quadrant. No bowel wall thickening or inflammatory change. Vascular/Lymphatic: Extensive atherosclerosis throughout the aorta and its distal branches. No pathologic adenopathy within the abdomen or pelvis. Reproductive: Status post hysterectomy. No adnexal masses. Other: No free fluid or free gas. Small fat containing umbilical and  inguinal hernias. No bowel herniation. Musculoskeletal: No acute or destructive bony lesions. Unremarkable right hip arthroplasty. Reconstructed images demonstrate no additional findings. Review of the MIP images confirms the above findings. IMPRESSION: 1. No evidence of pulmonary embolus. 2. No acute intrathoracic, intra-abdominal, or intrapelvic process. 3. Aortic Atherosclerosis (ICD10-I70.0) and Emphysema (ICD10-J43.9). 4. Cardiomegaly. Electronically Signed   By: Randa Ngo M.D.   On: 04/11/2021 17:03   DG Chest Portable 1 View  Result Date: 04/11/2021 CLINICAL DATA:  Shortness of breath EXAM: PORTABLE CHEST 1 VIEW COMPARISON:  Radiograph 04/07/2014 FINDINGS: Unchanged, enlarged cardiac silhouette. There are chronic interstitial opacities bilaterally. There is no new focal airspace disease. There is no large pleural effusion or visible pneumothorax. There is bilateral shoulder degenerative change. No acute osseous abnormality. Unchanged thoracolumbar scoliosis with multilevel degenerative disc disease. IMPRESSION: Unchanged cardiomegaly with chronic parenchymal changes. No new focal airspace consolidation. Electronically Signed   By: Maurine Simmering   On: 04/11/2021 16:03     CT chest reviewed, discussed with Dr. Raul Del who is seeing the patient at the bedside as well.  No evidence of acute PE or obvious acute intrathoracic or intra-abdominal pathology. ____________________________________________   PROCEDURES  Procedure(s) performed: None  Procedures  Critical Care performed: Yes, see critical care note(s)  Patient presents with dyspnea significant hypoxia and extreme tachycardia.  CRITICAL CARE Performed by: Delman Kitten   Total critical care time: 30 minutes  Critical care time was exclusive of separately billable procedures and treating other patients.  Critical care was necessary to treat or prevent imminent or life-threatening deterioration.  Critical care was time spent  personally by me on the following activities: development of treatment plan with patient and/or surrogate as well as nursing, discussions with consultants, evaluation of patient's response to treatment, examination of patient, obtaining history from patient or surrogate, ordering and performing treatments and interventions, ordering and review of laboratory studies, ordering and review of radiographic studies, pulse oximetry and re-evaluation of patient's condition.  ____________________________________________   INITIAL IMPRESSION / ASSESSMENT AND PLAN / ED COURSE  Pertinent labs & imaging results that were available during my care of the patient were reviewed by me and considered in my medical decision making (see chart for details).   Differential diagnosis includes, but is not limited to, ACS, aortic dissection, pulmonary embolism, cardiac tamponade, pneumothorax, pneumonia, pericarditis, myocarditis, GI-related causes including esophagitis/gastritis, and musculoskeletal chest wall pain.  Patient also has notable hypoxia and reports episode of nausea vomiting with a petechial rash noted on her cheeks for the last 3 days.  Will obtain CT angio of the chest to further evaluate for  etiology of dyspnea, has a history of COPD on 4 L of oxygen at home however she does not exhibit typical symptoms of COPD does not demonstrate wheezing does not demonstrate obvious respiratory distress at this time.  Certainly high my differential be etiology such as PE volume overload, etc. PNA, etc.     Clinical Course as of 04/11/21 2156  Tue Apr 11, 2021  1624 BNP elevated. Paged Dr. Vella Kohler to discuss. ? CHF, edema, overload as etiology. Last echo looked relatively reassuring without obvious CHF [MQ]    Clinical Course User Index [MQ] Delman Kitten, MD   Patient was not in the ED course to have episodes of obvious sinus rhythm with heart rate generally in the 70-90 range but also short runs of paroxysmal  tachycardia, and on further review I am suspicious the patient may be having intermittent runs of atrial flutter with 2-1 block or other atrial tachycardia.  It is not entirely clear, but the time of admission the patient has returned to normal sinus rhythm.  Discussed with the hospitalist, also discussed this concern with possible arrhythmia with Dr. Raul Del who feels may represent atrial flutter.    Patient has understand agreeable with plan for admission.  ____________________________________________   FINAL CLINICAL IMPRESSION(S) / ED DIAGNOSES  Final diagnoses:  Paroxysmal tachycardia ([place])  Acute hypoxemic respiratory failure ([place])        Note:  This document was prepared using Dragon voice recognition software and may include unintentional dictation errors       Delman Kitten, MD 04/11/21 2158

## 2021-04-11 NOTE — ED Notes (Signed)
Pt alert and oriented, resting, no complaints at this time

## 2021-04-11 NOTE — ED Notes (Signed)
Informed RN bed assigned 

## 2021-04-11 NOTE — ED Notes (Signed)
Meds not yet verified by pharmacist. Will administer ordered medications as soon as possible.

## 2021-04-11 NOTE — ED Notes (Signed)
X-Ray at bedside.

## 2021-04-11 NOTE — ED Triage Notes (Signed)
Pt to ED for centralized cp intermittent for past week and hypoxia. Reports increased shob all week. +n/v Pt speaking in short sentences, tachypneic  71% on 4L [place]. Placed NRB

## 2021-04-11 NOTE — ED Notes (Signed)
MD at bedside. 

## 2021-04-11 NOTE — H&P (Signed)
History and Physical    [name] [id number] DOB: [birth year] DOA: 04/11/2021  PCP: [name], MD  Patient coming from: home    Chief Complaint: shortness of breath   HPI: 87 y/o F w/ PMH of COPD on chronic 4L [place], HTN, GERD, skin cancers who presents w/ shortness of breath x 1 week of unknown etiology. Pt stated her home O2 monitor showed SaO2 level in the 70s while 4L [place]. The shortness of breath has become progressive worse. The shortness of breath is at rest as well as with exertion. Pt denies any fever, chills, sweating, chest pain, vomiting, abd pain, dysuria, urinary urgency or frequency, diarrhea or constipation     Review of Systems: As per HPI otherwise 14 point review of systems negative.   Past Medical History:  Diagnosis Date   Asthma    Hypertension    Melanoma ([place])    scalp (2007) & leg (2013)   Myocardial infarction [facility]) 1999   no PCI or CABG   Osteoporosis    Rheumatoid arthritis ([place])     Past Surgical History:  Procedure Laterality Date   ABDOMINAL HYSTERECTOMY     BREAST SURGERY     CARDIAC SURGERY     CARPAL TUNNEL RELEASE     MELANOMA EXCISION  2007   scalp   MELANOMA EXCISION Right 2013   lower leg   SHOULDER SURGERY     TOTAL HIP REVISION  2015   VIDEO BRONCHOSCOPY Bilateral 04/08/2014   Procedure: VIDEO BRONCHOSCOPY WITHOUT FLUORO;  Surgeon: Juanito Doom, MD;  Location: [facility] ENDOSCOPY;  Service: Cardiopulmonary;  Laterality: Bilateral;     reports that she quit smoking about 47 years ago. Her smoking use included cigarettes. She has a 33.00 pack-year smoking history. She has never used smokeless tobacco. She reports that she does not drink alcohol and does not use drugs.  Allergies  Allergen Reactions   Celebrex [Celecoxib] Other (See Comments)    Heart stops   Morphine And Related     Hallucinations     No family history on file. No family hx of heart disease or COPD.   Prior to Admission medications    Medication Sig Start Date End Date Taking? Authorizing Provider  albuterol-ipratropium (COMBIVENT) 18-103 MCG/ACT inhaler Inhale 2 puffs into the lungs every 6 (six) hours as needed for wheezing.    [provider]  benzonatate (TESSALON PERLES) 100 MG capsule Take 1 capsule (100 mg total) by mouth 3 (three) times daily as needed for cough. 04/08/14   Juanito Doom, MD  Cholecalciferol (VITAMIN D PO) Take 1 tablet by mouth daily.     [provider]  citalopram (CELEXA) 20 MG tablet Take 20 mg by mouth daily.    [provider]  fish oil-omega-3 fatty acids 1000 MG capsule Take 2 g by mouth daily.    [provider]  Flaxseed, Linseed, OIL Take by mouth.    [provider]  Fluticasone Furoate-Vilanterol (BREO ELLIPTA) 100-25 MCG/INH AEPB Inhale 1 puff into the lungs daily.    [provider]  gabapentin (NEURONTIN) 100 MG capsule Take 200 mg by mouth 3 (three) times daily.     [provider]  Glucosamine HCl-Glucosamin SO4 200-300 MG TABS Take 1 tablet by mouth daily.     [provider]  hydrochlorothiazide (HYDRODIURIL) 25 MG tablet Take 25 mg by mouth daily.    [provider]  HYDROcodone-acetaminophen (NORCO) 7.5-325 MG per tablet Take  1 tablet by mouth every 6 (six) hours as needed for moderate pain.    [provider]  ipratropium-albuterol (DUONEB) 0.5-2.5 (3) MG/3ML SOLN Take 3 mLs by nebulization every 6 (six) hours as needed.    [provider]  losartan (COZAAR) 50 MG tablet Take 50 mg by mouth daily.    [provider]  metoprolol succinate (TOPROL-XL) 100 MG 24 hr tablet Take 100 mg by mouth 2 (two) times daily. Take with or immediately following a meal.    [provider]  nitroGLYCERIN (NITROSTAT) 0.4 MG SL tablet Place 1 tablet (0.4 mg total) under the tongue every 5 (five) minutes as needed for chest pain. 02/23/13   Ivor Costa, MD  omeprazole (PRILOSEC) 20 MG  capsule Take 20 mg by mouth daily.    [provider]  ondansetron (ZOFRAN ODT) 4 MG disintegrating tablet Take 1 tablet (4 mg total) by mouth every 8 (eight) hours as needed. 04/07/21   Menshew, Dannielle Karvonen, PA-C    Physical Exam: Vitals:   04/11/21 1515 04/11/21 1524 04/11/21 1644 04/11/21 1719  BP:  (!) 189/112 (!) 190/83 (!) 184/102  Pulse: (!) 146 (!) 143 72 (!) 139  Resp: (!) 26 (!) 23 (!) 21 20  Temp:  98.5 F (36.9 C)    TempSrc:  Oral    SpO2:  91% 100% 95%    Constitutional: NAD, calm, comfortable. Frail appearing  Vitals:   04/11/21 1515 04/11/21 1524 04/11/21 1644 04/11/21 1719  BP:  (!) 189/112 (!) 190/83 (!) 184/102  Pulse: (!) 146 (!) 143 72 (!) 139  Resp: (!) 26 (!) 23 (!) 21 20  Temp:  98.5 F (36.9 C)    TempSrc:  Oral    SpO2:  91% 100% 95%   Eyes: PERRL, lids and conjunctivae normal ENMT: Mucous membranes are moist. Posterior pharynx clear of any exudate or lesions. Neck: normal, supple Respiratory: diminished breath sounds b/l otherwise clear Cardiovascular: tachycardia, no rubs / gallops. No extremity edema.  Abdomen: soft, no tenderness, ND. Bowel sounds positive.  Musculoskeletal: no clubbing / cyanosis. Good ROM.  Skin: multiple skin lesions on face, possibly acne. B/l LE are dressed and dressing are C/D/I Neurologic: CN 2-12 grossly intact. Decreased of b/l LE  Psychiatric: Normal judgment and insight. Alert and oriented x 3. Normal mood.     Labs on Admission: I have personally reviewed following labs and imaging studies  CBC: Recent Labs  Lab 04/07/21 1708 04/11/21 1523  WBC 7.8 9.4  NEUTROABS 6.5  --   HGB 10.9* 11.8*  HCT 32.7* 36.1  MCV 87.4 88.9  PLT 330 [id number]*   Basic Metabolic Panel: Recent Labs  Lab 04/07/21 1708 04/11/21 1523  NA 132* 135  K 3.7 4.1  CL 98 97*  CO2 26 29  GLUCOSE 106* 136*  BUN 17 20  CREATININE 0.84 0.95  CALCIUM 9.0 9.6   GFR: Estimated Creatinine Clearance: 30.5 mL/min (by C-G formula  based on SCr of 0.95 mg/dL). Liver Function Tests: No results for input(s): AST, ALT, ALKPHOS, BILITOT, PROT, ALBUMIN in the last 168 hours. No results for input(s): LIPASE, AMYLASE in the last 168 hours. No results for input(s): AMMONIA in the last 168 hours. Coagulation Profile: No results for input(s): INR, PROTIME in the last 168 hours. Cardiac Enzymes: No results for input(s): CKTOTAL, CKMB, CKMBINDEX, TROPONINI in the last 168 hours. BNP (last 3 results) No results for input(s): PROBNP in the last 8760 hours. HbA1C: No results  for input(s): HGBA1C in the last 72 hours. CBG: No results for input(s): GLUCAP in the last 168 hours. Lipid Profile: No results for input(s): CHOL, HDL, LDLCALC, TRIG, CHOLHDL, LDLDIRECT in the last 72 hours. Thyroid Function Tests: No results for input(s): TSH, T4TOTAL, FREET4, T3FREE, THYROIDAB in the last 72 hours. Anemia Panel: No results for input(s): VITAMINB12, FOLATE, FERRITIN, TIBC, IRON, RETICCTPCT in the last 72 hours. Urine analysis:    Component Value Date/Time   COLORURINE YELLOW 02/22/2013 1324   APPEARANCEUR CLOUDY (A) 02/22/2013 1324   LABSPEC 1.014 02/22/2013 1324   PHURINE 6.5 02/22/2013 1324   GLUCOSEU NEGATIVE 02/22/2013 1324   HGBUR NEGATIVE 02/22/2013 1324   BILIRUBINUR NEGATIVE 02/22/2013 1324   KETONESUR 15 (A) 02/22/2013 1324   PROTEINUR NEGATIVE 02/22/2013 1324   UROBILINOGEN 0.2 02/22/2013 1324   NITRITE NEGATIVE 02/22/2013 1324   LEUKOCYTESUR MODERATE (A) 02/22/2013 1324    Radiological Exams on Admission: CT Angio Chest PE W and/or Wo Contrast  Result Date: 04/11/2021 CLINICAL DATA:  Intermittent chest pain for 1 week, hypoxia, increasing shortness of breath, nausea and vomiting, tachypnea EXAM: CT ANGIOGRAPHY CHEST CT ABDOMEN AND PELVIS WITH CONTRAST TECHNIQUE: Multidetector CT imaging of the chest was performed using the standard protocol during bolus administration of intravenous contrast. Multiplanar CT image  reconstructions and MIPs were obtained to evaluate the vascular anatomy. Multidetector CT imaging of the abdomen and pelvis was performed using the standard protocol during bolus administration of intravenous contrast. CONTRAST:  63m OMNIPAQUE IOHEXOL 350 MG/ML SOLN COMPARISON:  04/11/2021 FINDINGS: CTA CHEST FINDINGS Cardiovascular: This is a technically adequate evaluation of the pulmonary vasculature. No filling defects or pulmonary emboli. The heart is enlarged without pericardial effusion. Normal caliber of the thoracic aorta. Diffuse atherosclerosis of the aorta and coronary vasculature. Mediastinum/Nodes: No enlarged mediastinal, hilar, or axillary lymph nodes. Thyroid gland, trachea, and esophagus demonstrate no significant findings. Lungs/Pleura: Upper lobe predominant emphysema. No acute airspace disease, effusion, or pneumothorax. Central airways are widely patent. Musculoskeletal: No acute displaced fractures. Multiple prior healed left-sided rib fractures are noted. Bilateral breast prostheses, with evidence of intracapsular rupture bilaterally. Reconstructed images demonstrate no additional findings. Review of the MIP images confirms the above findings. CT ABDOMEN and PELVIS FINDINGS Hepatobiliary: Small hepatic cysts are identified. Otherwise the liver is unremarkable. The gallbladder is grossly normal. No biliary duct dilation. Pancreas: Unremarkable. No pancreatic ductal dilatation or surrounding inflammatory changes. Spleen: Normal in size without focal abnormality. Adrenals/Urinary Tract: Multiple small left renal cortical cysts are noted, with scattered areas of left renal cortical scarring. The right kidney is unremarkable. No urinary tract calculi or obstructive uropathy. The adrenals and bladder are grossly normal. Stomach/Bowel: No bowel obstruction or ileus. Normal appendix right lower quadrant. No bowel wall thickening or inflammatory change. Vascular/Lymphatic: Extensive atherosclerosis  throughout the aorta and its distal branches. No pathologic adenopathy within the abdomen or pelvis. Reproductive: Status post hysterectomy. No adnexal masses. Other: No free fluid or free gas. Small fat containing umbilical and inguinal hernias. No bowel herniation. Musculoskeletal: No acute or destructive bony lesions. Unremarkable right hip arthroplasty. Reconstructed images demonstrate no additional findings. Review of the MIP images confirms the above findings. IMPRESSION: 1. No evidence of pulmonary embolus. 2. No acute intrathoracic, intra-abdominal, or intrapelvic process. 3. Aortic Atherosclerosis (ICD10-I70.0) and Emphysema (ICD10-J43.9). 4. Cardiomegaly. Electronically Signed   By: MRanda NgoM.D.   On: 04/11/2021 17:03   CT ABDOMEN PELVIS W CONTRAST  Result Date: 04/11/2021 CLINICAL DATA:  Intermittent chest pain for  1 week, hypoxia, increasing shortness of breath, nausea and vomiting, tachypnea EXAM: CT ANGIOGRAPHY CHEST CT ABDOMEN AND PELVIS WITH CONTRAST TECHNIQUE: Multidetector CT imaging of the chest was performed using the standard protocol during bolus administration of intravenous contrast. Multiplanar CT image reconstructions and MIPs were obtained to evaluate the vascular anatomy. Multidetector CT imaging of the abdomen and pelvis was performed using the standard protocol during bolus administration of intravenous contrast. CONTRAST:  93m OMNIPAQUE IOHEXOL 350 MG/ML SOLN COMPARISON:  04/11/2021 FINDINGS: CTA CHEST FINDINGS Cardiovascular: This is a technically adequate evaluation of the pulmonary vasculature. No filling defects or pulmonary emboli. The heart is enlarged without pericardial effusion. Normal caliber of the thoracic aorta. Diffuse atherosclerosis of the aorta and coronary vasculature. Mediastinum/Nodes: No enlarged mediastinal, hilar, or axillary lymph nodes. Thyroid gland, trachea, and esophagus demonstrate no significant findings. Lungs/Pleura: Upper lobe predominant  emphysema. No acute airspace disease, effusion, or pneumothorax. Central airways are widely patent. Musculoskeletal: No acute displaced fractures. Multiple prior healed left-sided rib fractures are noted. Bilateral breast prostheses, with evidence of intracapsular rupture bilaterally. Reconstructed images demonstrate no additional findings. Review of the MIP images confirms the above findings. CT ABDOMEN and PELVIS FINDINGS Hepatobiliary: Small hepatic cysts are identified. Otherwise the liver is unremarkable. The gallbladder is grossly normal. No biliary duct dilation. Pancreas: Unremarkable. No pancreatic ductal dilatation or surrounding inflammatory changes. Spleen: Normal in size without focal abnormality. Adrenals/Urinary Tract: Multiple small left renal cortical cysts are noted, with scattered areas of left renal cortical scarring. The right kidney is unremarkable. No urinary tract calculi or obstructive uropathy. The adrenals and bladder are grossly normal. Stomach/Bowel: No bowel obstruction or ileus. Normal appendix right lower quadrant. No bowel wall thickening or inflammatory change. Vascular/Lymphatic: Extensive atherosclerosis throughout the aorta and its distal branches. No pathologic adenopathy within the abdomen or pelvis. Reproductive: Status post hysterectomy. No adnexal masses. Other: No free fluid or free gas. Small fat containing umbilical and inguinal hernias. No bowel herniation. Musculoskeletal: No acute or destructive bony lesions. Unremarkable right hip arthroplasty. Reconstructed images demonstrate no additional findings. Review of the MIP images confirms the above findings. IMPRESSION: 1. No evidence of pulmonary embolus. 2. No acute intrathoracic, intra-abdominal, or intrapelvic process. 3. Aortic Atherosclerosis (ICD10-I70.0) and Emphysema (ICD10-J43.9). 4. Cardiomegaly. Electronically Signed   By: MRanda NgoM.D.   On: 04/11/2021 17:03   DG Chest Portable 1 View  Result Date:  04/11/2021 CLINICAL DATA:  Shortness of breath EXAM: PORTABLE CHEST 1 VIEW COMPARISON:  Radiograph 04/07/2014 FINDINGS: Unchanged, enlarged cardiac silhouette. There are chronic interstitial opacities bilaterally. There is no new focal airspace disease. There is no large pleural effusion or visible pneumothorax. There is bilateral shoulder degenerative change. No acute osseous abnormality. Unchanged thoracolumbar scoliosis with multilevel degenerative disc disease. IMPRESSION: Unchanged cardiomegaly with chronic parenchymal changes. No new focal airspace consolidation. Electronically Signed   By: JMaurine Simmering  On: 04/11/2021 16:03    EKG: Independently reviewed.   Assessment/Plan Active Problems:   * No active hospital problems. * Dyspnea: etiology unclear, possibly COPD exacerbation vs tachyarrhythmia. CTA was neg for PE, fluid overload or pneumonia. Elevated BNP but clinically does not appear fluid overload & no hx of CHF. Continue on bronchodilators. Currently at 99% on 4L [place]. Pulmon consulted by ER, Dr. FRaul Del F/u pulmon recs  Tachyarrhythmia: etiology unclear, sinus tachy vs SVT vs a.fib/flutter. Continue on tele and consult cardio if needed for arrhythmia.   Hyperglycemia: no hx of DM. Will continue to monitor  Thrombocytosis:  etiology unclear. Will continue to monitor  HTN: uncontrolled. Continue on metoprolol, losartan, HCTZ  B/l legs lesions: secondary skin cancer and taken care of by derm as an outpatient. Recently had procedures done on both legs as an outpt by derm  GERD: continue on PPI   DVT prophylaxis: lovenox Code Status: full  Family Communication: discussed pt's care w/ pt's family at bedside Disposition Plan: unclear Consults called: Pulmon, Dr. Raul Del consulted by ER doc Admission status: observation    Wyvonnia Dusky MD [facility]  If 7PM-7AM, please contact night-coverage   04/11/2021, 5:45 PM

## 2021-04-11 NOTE — Consult Note (Signed)
Pulmonary Medicine          Date: 04/11/2021,   MRN# [id number] [name] 11-14-1932     Admission                  Current       CHIEF COMPLAINT:   Hypoxia and shortness of breath  HISTORY OF PRESENT ILLNESS   This is an 87 yr old lady, asthma/copd, cad, ra, htn, well known to my service. Presents to the ER c/o dyspnea over several days, wheezing at times and vague anterior chest discomfort. On arrival her sats were down in the 70's. Hence placed on the non rebreather.  While in the Er her heart bounced from 70 to 140 range. More so after nebs.   Presently her heart rate NSR (76/min), b/p 180/112. Sats 100 % on 8 liter non rebreather mask. No pleurist hemoptysis or syncope. S/P   Recent surgery ( ).   K 4.1 cr 0.95, wbc 9.4, hgb 11.8, BNP 1870,  PAST MEDICAL HISTORY   Past Medical History:  Diagnosis Date   Asthma    Hypertension    Melanoma ([place])    scalp (2007) & leg (2013)   Myocardial infarction ([place]) 1999   no PCI or CABG   Osteoporosis    Rheumatoid arthritis ([place])      SURGICAL HISTORY   Past Surgical History:  Procedure Laterality Date   ABDOMINAL HYSTERECTOMY     BREAST SURGERY     CARDIAC SURGERY     CARPAL TUNNEL RELEASE     MELANOMA EXCISION  2007   scalp   MELANOMA EXCISION Right 2013   lower leg   SHOULDER SURGERY     TOTAL HIP REVISION  2015   VIDEO BRONCHOSCOPY Bilateral 04/08/2014   Procedure: VIDEO BRONCHOSCOPY WITHOUT FLUORO;  Surgeon: Juanito Doom, MD;  Location: [facility] ENDOSCOPY;  Service: Cardiopulmonary;  Laterality: Bilateral;     FAMILY HISTORY   No family history on file.   SOCIAL HISTORY   Social History   Tobacco Use   Smoking status: Former    Packs/day: 1.50    Years: 22.00    Pack years: 33.00    Types: Cigarettes    Quit date: 04/07/1974    Years since quitting: 47.0   Smokeless tobacco: Never  Substance Use Topics   Alcohol use: No   Drug use: No     MEDICATIONS    Home  Medication:  Current Outpatient Rx   Order #: [id number] Class: Historical Med   Order #: [id number] Class: Normal   Order #: [id number] Class: Historical Med   Order #: [id number] Class: Historical Med   Order #: [id number] Class: Historical Med   Order #: [id number] Class: Historical Med   Order #: [id number] Class: Historical Med   Order #: [id number] Class: Historical Med   Order #: [id number] Class: Historical Med   Order #: [id number] Class: Historical Med   Order #: [id number] Class: Historical Med   Order #: [id number] Class: Historical Med   Order #: [id number] Class: Historical Med   Order #: [id number] Class: Historical Med   Order #: [id number] Class: Print   Order #: [id number] Class: Historical Med   Order #: [id number] Class: Normal    Current Medication: No current facility-administered medications for this encounter.  Current Outpatient Medications:    albuterol-ipratropium (COMBIVENT) 18-103 MCG/ACT inhaler, Inhale 2 puffs into the lungs every 6 (six) hours as needed for wheezing., Disp: , Rfl:    benzonatate (TESSALON PERLES) 100 MG capsule, Take 1 capsule (100  mg total) by mouth 3 (three) times daily as needed for cough., Disp: 20 capsule, Rfl: 1   Cholecalciferol (VITAMIN D PO), Take 1 tablet by mouth daily. , Disp: , Rfl:    citalopram (CELEXA) 20 MG tablet, Take 20 mg by mouth daily., Disp: , Rfl:    fish oil-omega-3 fatty acids 1000 MG capsule, Take 2 g by mouth daily., Disp: , Rfl:    Flaxseed, Linseed, OIL, Take by mouth., Disp: , Rfl:    Fluticasone Furoate-Vilanterol (BREO ELLIPTA) 100-25 MCG/INH AEPB, Inhale 1 puff into the lungs daily., Disp: , Rfl:    gabapentin (NEURONTIN) 100 MG capsule, Take 200 mg by mouth 3 (three) times daily. , Disp: , Rfl:    Glucosamine HCl-Glucosamin SO4 200-300 MG TABS, Take 1 tablet by mouth daily. , Disp: , Rfl:    hydrochlorothiazide (HYDRODIURIL) 25 MG tablet, Take 25 mg by mouth daily., Disp: , Rfl:    HYDROcodone-acetaminophen (NORCO) 7.5-325 MG per tablet, Take 1  tablet by mouth every 6 (six) hours as needed for moderate pain., Disp: , Rfl:    ipratropium-albuterol (DUONEB) 0.5-2.5 (3) MG/3ML SOLN, Take 3 mLs by nebulization every 6 (six) hours as needed., Disp: , Rfl:    losartan (COZAAR) 50 MG tablet, Take 50 mg by mouth daily., Disp: , Rfl:    metoprolol succinate (TOPROL-XL) 100 MG 24 hr tablet, Take 100 mg by mouth 2 (two) times daily. Take with or immediately following a meal., Disp: , Rfl:    nitroGLYCERIN (NITROSTAT) 0.4 MG SL tablet, Place 1 tablet (0.4 mg total) under the tongue every 5 (five) minutes as needed for chest pain., Disp: 30 tablet, Rfl: 3   omeprazole (PRILOSEC) 20 MG capsule, Take 20 mg by mouth daily., Disp: , Rfl:    ondansetron (ZOFRAN ODT) 4 MG disintegrating tablet, Take 1 tablet (4 mg total) by mouth every 8 (eight) hours as needed., Disp: 15 tablet, Rfl: 0    ALLERGIES   Celebrex [celecoxib] and Morphine and related     REVIEW OF SYSTEMS    Review of Systems:  Gen:  Denies  fever, sweats, chills weigh loss  HEENT: Denies blurred vision, double vision, ear pain, eye pain, hearing loss, nose bleeds, sore throat Cardiac:  No dizziness, chest pain or heaviness, chest tightness,edema Resp:   Denies cough or sputum porduction, ++ shortness of breath,wheezing, -hemoptysis,  Gi: Denies swallowing difficulty, stomach pain, nausea or vomiting, diarrhea, constipation, bowel incontinence Gu:  Denies bladder incontinence, burning urine Ext:   Denies Joint pain, stiffness or swelling Skin: Denies  skin rash, easy bruising or bleeding or hives Endoc:  Denies polyuria, polydipsia , polyphagia or weight change Psych:   Denies depression, insomnia or hallucinations   Other:  All other systems negative   VS: BP (!) 184/102 (BP Location: Left Arm)   Pulse (!) 139   Temp 98.5 F (36.9 C) (Oral)   Resp 20   SpO2 95%      PHYSICAL EXAM    GENERAL: Small frame, lying relatively flat in bed,  non rebreather mask on, f  amily in the room.  HEAD: Normocephalic, atraumatic.  EYES: Pupils equal, round, reactive to light. Extraocular muscles intact. No scleral icterus.  MOUTH: Moist mucosal membrane. EAR, NOSE, THROAT: Clear without exudates. No external lesions.  NECK: Supple. No thyromegaly. No nodules. No JVD. No stridor PULMONARY: moving air no obvious rales CARDIOVASCULAR: S1 and S2. Regular rate and rhythm. No rubs, or gallops. No edema. Lrgs wrapped. Pedal  pulses 2+ bilaterally.  GASTROINTESTINAL: Soft, nontender, nondistended. No masses. Positive bowel sounds. No hepatosplenomegaly.  MUSCULOSKELETAL: +clubbing,  Range of motion full in all extremities.  NEUROLOGIC: Cranial nerves II through XII are intact. No gross focal neurological deficits. Sensation intact. Reflexes intact.  SKIN: No ulceration, lesions, rashes, or cyanosis. Skin warm and dry. Turgor intact.  PSYCHIATRIC: Mood, affect within normal limits. The patient is awake, alert and oriented x 2. .       IMAGING    CT Angio Chest PE W and/or Wo Contrast  Result Date: 04/11/2021 CLINICAL DATA:  Intermittent chest pain for 1 week, hypoxia, increasing shortness of breath, nausea and vomiting, tachypnea EXAM: CT ANGIOGRAPHY CHEST CT ABDOMEN AND PELVIS WITH CONTRAST TECHNIQUE: Multidetector CT imaging of the chest was performed using the standard protocol during bolus administration of intravenous contrast. Multiplanar CT image reconstructions and MIPs were obtained to evaluate the vascular anatomy. Multidetector CT imaging of the abdomen and pelvis was performed using the standard protocol during bolus administration of intravenous contrast. CONTRAST:  23m OMNIPAQUE IOHEXOL 350 MG/ML SOLN COMPARISON:  04/11/2021 FINDINGS: CTA CHEST FINDINGS Cardiovascular: This is a technically adequate evaluation of the pulmonary vasculature. No filling defects or pulmonary emboli. The heart is enlarged without pericardial effusion. Normal caliber of the thoracic  aorta. Diffuse atherosclerosis of the aorta and coronary vasculature. Mediastinum/Nodes: No enlarged mediastinal, hilar, or axillary lymph nodes. Thyroid gland, trachea, and esophagus demonstrate no significant findings. Lungs/Pleura: Upper lobe predominant emphysema. No acute airspace disease, effusion, or pneumothorax. Central airways are widely patent. Musculoskeletal: No acute displaced fractures. Multiple prior healed left-sided rib fractures are noted. Bilateral breast prostheses, with evidence of intracapsular rupture bilaterally. Reconstructed images demonstrate no additional findings. Review of the MIP images confirms the above findings. CT ABDOMEN and PELVIS FINDINGS Hepatobiliary: Small hepatic cysts are identified. Otherwise the liver is unremarkable. The gallbladder is grossly normal. No biliary duct dilation. Pancreas: Unremarkable. No pancreatic ductal dilatation or surrounding inflammatory changes. Spleen: Normal in size without focal abnormality. Adrenals/Urinary Tract: Multiple small left renal cortical cysts are noted, with scattered areas of left renal cortical scarring. The right kidney is unremarkable. No urinary tract calculi or obstructive uropathy. The adrenals and bladder are grossly normal. Stomach/Bowel: No bowel obstruction or ileus. Normal appendix right lower quadrant. No bowel wall thickening or inflammatory change. Vascular/Lymphatic: Extensive atherosclerosis throughout the aorta and its distal branches. No pathologic adenopathy within the abdomen or pelvis. Reproductive: Status post hysterectomy. No adnexal masses. Other: No free fluid or free gas. Small fat containing umbilical and inguinal hernias. No bowel herniation. Musculoskeletal: No acute or destructive bony lesions. Unremarkable right hip arthroplasty. Reconstructed images demonstrate no additional findings. Review of the MIP images confirms the above findings. IMPRESSION: 1. No evidence of pulmonary embolus. 2. No acute  intrathoracic, intra-abdominal, or intrapelvic process. 3. Aortic Atherosclerosis (ICD10-I70.0) and Emphysema (ICD10-J43.9). 4. Cardiomegaly. Electronically Signed   By: MRanda NgoM.D.   On: 04/11/2021 17:03   CT ABDOMEN PELVIS W CONTRAST  Result Date: 04/11/2021 CLINICAL DATA:  Intermittent chest pain for 1 week, hypoxia, increasing shortness of breath, nausea and vomiting, tachypnea EXAM: CT ANGIOGRAPHY CHEST CT ABDOMEN AND PELVIS WITH CONTRAST TECHNIQUE: Multidetector CT imaging of the chest was performed using the standard protocol during bolus administration of intravenous contrast. Multiplanar CT image reconstructions and MIPs were obtained to evaluate the vascular anatomy. Multidetector CT imaging of the abdomen and pelvis was performed using the standard protocol during bolus administration of intravenous contrast. CONTRAST:  8m OMNIPAQUE IOHEXOL 350 MG/ML SOLN COMPARISON:  04/11/2021 FINDINGS: CTA CHEST FINDINGS Cardiovascular: This is a technically adequate evaluation of the pulmonary vasculature. No filling defects or pulmonary emboli. The heart is enlarged without pericardial effusion. Normal caliber of the thoracic aorta. Diffuse atherosclerosis of the aorta and coronary vasculature. Mediastinum/Nodes: No enlarged mediastinal, hilar, or axillary lymph nodes. Thyroid gland, trachea, and esophagus demonstrate no significant findings. Lungs/Pleura: Upper lobe predominant emphysema. No acute airspace disease, effusion, or pneumothorax. Central airways are widely patent. Musculoskeletal: No acute displaced fractures. Multiple prior healed left-sided rib fractures are noted. Bilateral breast prostheses, with evidence of intracapsular rupture bilaterally. Reconstructed images demonstrate no additional findings. Review of the MIP images confirms the above findings. CT ABDOMEN and PELVIS FINDINGS Hepatobiliary: Small hepatic cysts are identified. Otherwise the liver is unremarkable. The gallbladder is  grossly normal. No biliary duct dilation. Pancreas: Unremarkable. No pancreatic ductal dilatation or surrounding inflammatory changes. Spleen: Normal in size without focal abnormality. Adrenals/Urinary Tract: Multiple small left renal cortical cysts are noted, with scattered areas of left renal cortical scarring. The right kidney is unremarkable. No urinary tract calculi or obstructive uropathy. The adrenals and bladder are grossly normal. Stomach/Bowel: No bowel obstruction or ileus. Normal appendix right lower quadrant. No bowel wall thickening or inflammatory change. Vascular/Lymphatic: Extensive atherosclerosis throughout the aorta and its distal branches. No pathologic adenopathy within the abdomen or pelvis. Reproductive: Status post hysterectomy. No adnexal masses. Other: No free fluid or free gas. Small fat containing umbilical and inguinal hernias. No bowel herniation. Musculoskeletal: No acute or destructive bony lesions. Unremarkable right hip arthroplasty. Reconstructed images demonstrate no additional findings. Review of the MIP images confirms the above findings. IMPRESSION: 1. No evidence of pulmonary embolus. 2. No acute intrathoracic, intra-abdominal, or intrapelvic process. 3. Aortic Atherosclerosis (ICD10-I70.0) and Emphysema (ICD10-J43.9). 4. Cardiomegaly. Electronically Signed   By: MRanda NgoM.D.   On: 04/11/2021 17:03   DG Chest Portable 1 View  Result Date: 04/11/2021 CLINICAL DATA:  Shortness of breath EXAM: PORTABLE CHEST 1 VIEW COMPARISON:  Radiograph 04/07/2014 FINDINGS: Unchanged, enlarged cardiac silhouette. There are chronic interstitial opacities bilaterally. There is no new focal airspace disease. There is no large pleural effusion or visible pneumothorax. There is bilateral shoulder degenerative change. No acute osseous abnormality. Unchanged thoracolumbar scoliosis with multilevel degenerative disc disease. IMPRESSION: Unchanged cardiomegaly with chronic parenchymal  changes. No new focal airspace consolidation. Electronically Signed   By: JMaurine Simmering  On: 04/11/2021 16:03    INTERPRETATION  NORMAL LEFT VENTRICULAR SYSTOLIC FUNCTION WITH AN ESTIMATED EF = 55 %  NORMAL RIGHT VENTRICULAR SYSTOLIC FUNCTION  MODERATE MITRAL VALVE INSUFFICIENCY  MILD-TO-MODERATE TRICUSPID VALVE INSUFFICIENCY  MILD AORTIC VALVE INSUFFICIENCY  MILD AORTIC VALVE STENOSIS WITH A CALCULATED AVA = 1.6 cm^2  MODERATE LA ENLARGEMENT  MILD RV ENLARGEMENT  MILD RA ENLARGEMENT  MILD LVH  POSSIBLE PFO  _________________________________________________________________________________________  Electronically signed by    DLujean Amel MD on 11/17/2020 05: 09 PM           Performed By: MJohnathan Hausen RDCS, RVT     Ordering Physician: FWallene Huh Bubble echo was done . No pfo  Left ventricular ejection fraction, by estimation, is 55 to 60%. The left ventricle has normal function. The left ventricle has no regional wall motion abnormalities. Left ventricular diastolic parameters are consistent with Grade III diastolic dysfunction (restrictive). 2. Right ventricular systolic function is normal. The right ventricular size is normal. 3. The mitral valve is myxomatous. Trivial mitral  valve regurgitation. 4. The tricuspid valve is myxomatous. Tricuspid valve regurgitation is mild to moderate. 5. The aortic valve is grossly normal. Aortic valve regurgitation is mild. Mild to moderate aortic valve sclerosis/calcification is present, without any evidence of aortic stenosis.  ASSESSMENT/PLAN   This is a pleasant small frame lady who came in with hypoxia, tachycardia ? bouts of svt, in the setting ok known copd. On w/u thus far there is no pe, no acute infiltrates on chest ct, no signs of ischemia, heart looks good on echo,   Hypoxia in the presence of normal cxr, no ischemia point to copd/asthma, ( fev1 1.1 liters), vascular causes (pulm htn, pe, the later of which is not present).  ? Intermittent PFO.  ? Mild pulmonary edema (elevated bnp)  -will treat with  xoponex 0.63 dose q six hrs via nebs,  -dulera 2 puffs bid -iprotropium q 6 via nebs -solumderol 40 mg iv q 12 x 4 doses and see -wean o2 to 4 liters (her baseline) -will recommend admitting -trial of lasix 20-40 mg x 1  Tachyarrythmia ? Svt,  -Keep sats greater than 92 % -ck mag, tsh, serial troponins   Elevated b/p -resume her b/p meds  FURTHER ORDERS PER ABOVE   Thank you for allowing me to participate in the care of this patient.   Patient/Family are satisfied with care plan and all questions have been answered.  This document was prepared using Dragon voice recognition software and may include unintentional dictation errors.     Wallene Huh, M.D.  Division of [facility]

## 2021-04-11 NOTE — ED Notes (Signed)
Pt not complaining of any pain at this time, alert and oriented

## 2021-04-12 ENCOUNTER — Other Ambulatory Visit: Payer: Self-pay

## 2021-04-12 ENCOUNTER — Observation Stay: Payer: Medicare HMO

## 2021-04-12 ENCOUNTER — Encounter: Payer: Self-pay | Admitting: Internal Medicine

## 2021-04-12 DIAGNOSIS — J9601 Acute respiratory failure with hypoxia: Secondary | ICD-10-CM | POA: Diagnosis not present

## 2021-04-12 DIAGNOSIS — Z7951 Long term (current) use of inhaled steroids: Secondary | ICD-10-CM | POA: Diagnosis not present

## 2021-04-12 DIAGNOSIS — F419 Anxiety disorder, unspecified: Secondary | ICD-10-CM | POA: Diagnosis present

## 2021-04-12 DIAGNOSIS — M810 Age-related osteoporosis without current pathological fracture: Secondary | ICD-10-CM | POA: Diagnosis present

## 2021-04-12 DIAGNOSIS — R531 Weakness: Secondary | ICD-10-CM

## 2021-04-12 DIAGNOSIS — Z85820 Personal history of malignant melanoma of skin: Secondary | ICD-10-CM | POA: Diagnosis not present

## 2021-04-12 DIAGNOSIS — K219 Gastro-esophageal reflux disease without esophagitis: Secondary | ICD-10-CM | POA: Diagnosis present

## 2021-04-12 DIAGNOSIS — F32A Depression, unspecified: Secondary | ICD-10-CM | POA: Diagnosis present

## 2021-04-12 DIAGNOSIS — J441 Chronic obstructive pulmonary disease with (acute) exacerbation: Principal | ICD-10-CM

## 2021-04-12 DIAGNOSIS — D75839 Thrombocytosis, unspecified: Secondary | ICD-10-CM | POA: Diagnosis present

## 2021-04-12 DIAGNOSIS — Z87891 Personal history of nicotine dependence: Secondary | ICD-10-CM | POA: Diagnosis not present

## 2021-04-12 DIAGNOSIS — Z20822 Contact with and (suspected) exposure to covid-19: Secondary | ICD-10-CM | POA: Diagnosis present

## 2021-04-12 DIAGNOSIS — J9621 Acute and chronic respiratory failure with hypoxia: Secondary | ICD-10-CM | POA: Diagnosis present

## 2021-04-12 DIAGNOSIS — R000 Tachycardia, unspecified: Secondary | ICD-10-CM | POA: Diagnosis not present

## 2021-04-12 DIAGNOSIS — R7989 Other specified abnormal findings of blood chemistry: Secondary | ICD-10-CM

## 2021-04-12 DIAGNOSIS — Z79899 Other long term (current) drug therapy: Secondary | ICD-10-CM | POA: Diagnosis not present

## 2021-04-12 DIAGNOSIS — Z886 Allergy status to analgesic agent status: Secondary | ICD-10-CM | POA: Diagnosis not present

## 2021-04-12 DIAGNOSIS — R7301 Impaired fasting glucose: Secondary | ICD-10-CM | POA: Diagnosis present

## 2021-04-12 DIAGNOSIS — I2510 Atherosclerotic heart disease of native coronary artery without angina pectoris: Secondary | ICD-10-CM | POA: Diagnosis present

## 2021-04-12 DIAGNOSIS — R739 Hyperglycemia, unspecified: Secondary | ICD-10-CM | POA: Diagnosis present

## 2021-04-12 DIAGNOSIS — Z885 Allergy status to narcotic agent status: Secondary | ICD-10-CM | POA: Diagnosis not present

## 2021-04-12 DIAGNOSIS — I479 Paroxysmal tachycardia, unspecified: Secondary | ICD-10-CM | POA: Diagnosis present

## 2021-04-12 DIAGNOSIS — I5031 Acute diastolic (congestive) heart failure: Secondary | ICD-10-CM | POA: Diagnosis present

## 2021-04-12 DIAGNOSIS — M069 Rheumatoid arthritis, unspecified: Secondary | ICD-10-CM | POA: Diagnosis present

## 2021-04-12 DIAGNOSIS — I10 Essential (primary) hypertension: Secondary | ICD-10-CM

## 2021-04-12 DIAGNOSIS — I110 Hypertensive heart disease with heart failure: Secondary | ICD-10-CM | POA: Diagnosis present

## 2021-04-12 DIAGNOSIS — I252 Old myocardial infarction: Secondary | ICD-10-CM | POA: Diagnosis not present

## 2021-04-12 LAB — CBC
HCT: 31.1 % — ABNORMAL LOW (ref 36.0–46.0)
Hemoglobin: 10.3 g/dL — ABNORMAL LOW (ref 12.0–15.0)
MCH: 29.2 pg (ref 26.0–34.0)
MCHC: 33.1 g/dL (ref 30.0–36.0)
MCV: 88.1 fL (ref 80.0–100.0)
Platelets: 377 10*3/uL (ref 150–400)
RBC: 3.53 MIL/uL — ABNORMAL LOW (ref 3.87–5.11)
RDW: 13.3 % (ref 11.5–15.5)
WBC: 5.1 10*3/uL (ref 4.0–10.5)
nRBC: 0 % (ref 0.0–0.2)

## 2021-04-12 LAB — BASIC METABOLIC PANEL
Anion gap: 10 (ref 5–15)
BUN: 25 mg/dL — ABNORMAL HIGH (ref 8–23)
CO2: 27 mmol/L (ref 22–32)
Calcium: 8.7 mg/dL — ABNORMAL LOW (ref 8.9–10.3)
Chloride: 101 mmol/L (ref 98–111)
Creatinine, Ser: 0.88 mg/dL (ref 0.44–1.00)
GFR, Estimated: >60 mL/min (ref >60)
Glucose, Bld: 116 mg/dL — ABNORMAL HIGH (ref 70–99)
Potassium: 3.8 mmol/L (ref 3.5–5.1)
Sodium: 138 mmol/L (ref 135–145)

## 2021-04-12 MED ORDER — BISOPROLOL FUMARATE 5 MG PO TABS
2.5000 mg | ORAL_TABLET | Freq: Every day | ORAL | Status: DC
  Administered 2021-04-12: 2.5 mg via ORAL
  Filled 2021-04-12 (×2): qty 0.5

## 2021-04-12 MED ORDER — MONTELUKAST SODIUM 10 MG PO TABS
10.0000 mg | ORAL_TABLET | Freq: Every day | ORAL | Status: DC
  Administered 2021-04-12: 10 mg via ORAL
  Filled 2021-04-12: qty 1

## 2021-04-12 MED ORDER — CITALOPRAM HYDROBROMIDE 20 MG PO TABS
20.0000 mg | ORAL_TABLET | Freq: Every day | ORAL | Status: DC
  Administered 2021-04-12 – 2021-04-13 (×2): 20 mg via ORAL
  Filled 2021-04-12 (×2): qty 1

## 2021-04-12 MED ORDER — FUROSEMIDE 20 MG PO TABS
20.0000 mg | ORAL_TABLET | Freq: Every day | ORAL | Status: DC
  Administered 2021-04-13: 20 mg via ORAL
  Filled 2021-04-12: qty 1

## 2021-04-12 MED ORDER — HYDRALAZINE HCL 20 MG/ML IJ SOLN
5.0000 mg | Freq: Once | INTRAMUSCULAR | Status: AC
  Administered 2021-04-12: 5 mg via INTRAVENOUS
  Filled 2021-04-12: qty 1

## 2021-04-12 MED ORDER — METHYLPREDNISOLONE SODIUM SUCC 40 MG IJ SOLR
40.0000 mg | Freq: Two times a day (BID) | INTRAMUSCULAR | Status: DC
  Administered 2021-04-12 – 2021-04-13 (×2): 40 mg via INTRAVENOUS
  Filled 2021-04-12 (×2): qty 1

## 2021-04-12 MED ORDER — BISOPROLOL FUMARATE 5 MG PO TABS
5.0000 mg | ORAL_TABLET | Freq: Every day | ORAL | Status: DC
  Filled 2021-04-12: qty 1

## 2021-04-12 MED ORDER — OMEGA-3-ACID ETHYL ESTERS 1 G PO CAPS
2.0000 g | ORAL_CAPSULE | Freq: Every day | ORAL | Status: DC
  Administered 2021-04-12 – 2021-04-13 (×2): 2 g via ORAL
  Filled 2021-04-12 (×2): qty 2

## 2021-04-12 MED ORDER — FUROSEMIDE 10 MG/ML IJ SOLN
20.0000 mg | Freq: Once | INTRAMUSCULAR | Status: AC
  Administered 2021-04-12: 20 mg via INTRAVENOUS
  Filled 2021-04-12: qty 4

## 2021-04-12 MED ORDER — LISINOPRIL 20 MG PO TABS
40.0000 mg | ORAL_TABLET | Freq: Every day | ORAL | Status: DC
  Administered 2021-04-12 – 2021-04-13 (×2): 40 mg via ORAL
  Filled 2021-04-12 (×2): qty 2

## 2021-04-12 MED ORDER — FLUTICASONE FUROATE-VILANTEROL 100-25 MCG/INH IN AEPB
1.0000 | INHALATION_SPRAY | Freq: Every day | RESPIRATORY_TRACT | Status: DC
  Administered 2021-04-12 – 2021-04-13 (×2): 1 via RESPIRATORY_TRACT
  Filled 2021-04-12: qty 28

## 2021-04-12 MED ORDER — SUCRALFATE 1 G PO TABS
1.0000 g | ORAL_TABLET | Freq: Four times a day (QID) | ORAL | Status: DC
  Administered 2021-04-12 – 2021-04-13 (×5): 1 g via ORAL
  Filled 2021-04-12 (×5): qty 1

## 2021-04-12 MED ORDER — FLUTICASONE-UMECLIDIN-VILANT 100-62.5-25 MCG/INH IN AEPB: 1.0000 | INHALATION_SPRAY | Freq: Every day | RESPIRATORY_TRACT | Status: DC

## 2021-04-12 MED ORDER — MAGNESIUM SULFATE 2 GM/50ML IV SOLN
2.0000 g | Freq: Once | INTRAVENOUS | Status: AC
Start: 2021-04-12 — End: 2021-04-12
  Administered 2021-04-12: 2 g via INTRAVENOUS
  Filled 2021-04-12: qty 50

## 2021-04-12 MED ORDER — DILTIAZEM HCL ER COATED BEADS 120 MG PO CP24
240.0000 mg | ORAL_CAPSULE | Freq: Every day | ORAL | Status: DC
  Administered 2021-04-12: 240 mg via ORAL
  Filled 2021-04-12: qty 2

## 2021-04-12 MED ORDER — UMECLIDINIUM BROMIDE 62.5 MCG/INH IN AEPB
1.0000 | INHALATION_SPRAY | Freq: Every day | RESPIRATORY_TRACT | Status: DC
  Administered 2021-04-12 – 2021-04-13 (×2): 1 via RESPIRATORY_TRACT
  Filled 2021-04-12: qty 7

## 2021-04-12 NOTE — Progress Notes (Signed)
Bedside RN contacted on call provider Dr. Anson Fret after pt noted to have elevated heart rate sustaining in the 120s and a BP of 170/110. Respiratory therapy contacted for pt maintaining O2 sat in the low 80s. Pt placed on HFNC 6L. Pt now satting in the low 90s. Pt given IV hydralazine for pts BP. Pt has no complaints of pain or SOB. Chest xray ordered  by MD.

## 2021-04-12 NOTE — Progress Notes (Signed)
Pulmonary Medicine          D HISTORY OF PRESENT ILLNESS   This is an 87 yr old lady, asthma/copd, cad, ra, htn, well known to my service. Presents to the ER c/o dyspnea over several days, wheezing at times and vague anterior chest discomfort. On arrival her sats were down in the 70's. Hence placed on the non rebreather.  While in the Er her heart bounced from 70 to 140 range. More so after nebs.   Presently her heart rate NSR (76/min), b/p 180/112. Sats 100 % on 8 liter non rebreather mask. No pleurist hemoptysis or syncope. S/P   Recent surgery ( ).   K 4.1 cr 0.95, wbc 9.4, hgb 11.8, BNP 1870,  04/12/21 - uneventful day, placed on 6 liters [place] 02, toleating well, heart rate much improved,b/p improving and her dyspnea has improved.       PAST MEDICAL HISTORY   Past Medical History:  Diagnosis Date  . Asthma   . Hypertension   . Melanoma ([place])    scalp (2007) & leg (2013)  . Myocardial infarction ([place]) 1999   no PCI or CABG  . Osteoporosis   . Rheumatoid arthritis ([place])      SURGICAL HISTORY   Past Surgical History:  Procedure Laterality Date  . ABDOMINAL HYSTERECTOMY    . BREAST SURGERY    . CARDIAC SURGERY    . CARPAL TUNNEL RELEASE    . MELANOMA EXCISION  2007   scalp  . MELANOMA EXCISION Right 2013   lower leg  . SHOULDER SURGERY    . TOTAL HIP REVISION  2015  . VIDEO BRONCHOSCOPY Bilateral 04/08/2014   Procedure: VIDEO BRONCHOSCOPY WITHOUT FLUORO;  Surgeon: Juanito Doom, MD;  Location: [facility] ENDOSCOPY;  Service: Cardiopulmonary;  Laterality: Bilateral;     FAMILY HISTORY   History reviewed. No pertinent family history.   SOCIAL HISTORY   Social History   Tobacco Use  . Smoking status: Former    Packs/day: 1.50    Years: 22.00    Pack years: 33.00    Types: Cigarettes    Quit date: 04/07/1974    Years since quitting: 47.0  . Smokeless tobacco: Never  Substance Use Topics  . Alcohol use: No  . Drug use: No     MEDICATIONS     Home Medication:    Current Medication:  Current Facility-Administered Medications:  .  acetaminophen (TYLENOL) tablet 650 mg, 650 mg, Oral, Q6H PRN, Wyvonnia Dusky, MD, 650 mg at 04/12/21 1451 .  bisoprolol (ZEBETA) tablet 2.5 mg, 2.5 mg, Oral, QHS, Wieting, Richard, MD .  citalopram (CELEXA) tablet 20 mg, 20 mg, Oral, Daily, Wieting, Richard, MD .  diltiazem (CARDIZEM CD) 24 hr capsule 240 mg, 240 mg, Oral, Daily, Leslye Peer, Richard, MD, 240 mg at 04/12/21 0846 .  docusate sodium (COLACE) capsule 200 mg, 200 mg, Oral, BID PRN, Wyvonnia Dusky, MD .  enoxaparin (LOVENOX) injection 40 mg, 40 mg, Subcutaneous, Q24H, Wyvonnia Dusky, MD, 40 mg at 04/11/21 2247 .  fluticasone furoate-vilanterol (BREO ELLIPTA) 100-25 MCG/INH 1 puff, 1 puff, Inhalation, Daily, Loletha Grayer, MD, 1 puff at 04/12/21 0934 .  [START ON 04/13/2021] furosemide (LASIX) tablet 20 mg, 20 mg, Oral, Daily, Wieting, Richard, MD .  gabapentin (NEURONTIN) capsule 200 mg, 200 mg, Oral, BID, Wyvonnia Dusky, MD, 200 mg at 04/12/21 0846 .  levalbuterol (XOPENEX) nebulizer solution 0.63 mg, 0.63 mg, Nebulization, Q6H PRN, Eppie Gibson M,  MD .  lisinopril (ZESTRIL) tablet 40 mg, 40 mg, Oral, Daily, Leslye Peer, Richard, MD, 40 mg at 04/12/21 0847 .  magnesium sulfate IVPB 2 g 50 mL, 2 g, Intravenous, Once, Wieting, Richard, MD .  methylPREDNISolone sodium succinate (SOLU-MEDROL) 40 mg/mL injection 40 mg, 40 mg, Intravenous, Q12H, Wieting, Richard, MD, 40 mg at 04/12/21 1717 .  montelukast (SINGULAIR) tablet 10 mg, 10 mg, Oral, QHS, Wieting, Richard, MD .  omega-3 acid ethyl esters (LOVAZA) capsule 2 g, 2 g, Oral, Daily, Leslye Peer, Richard, MD, 2 g at 04/12/21 0846 .  ondansetron (ZOFRAN) injection 4 mg, 4 mg, Intravenous, Q8H PRN, Wyvonnia Dusky, MD .  oxyCODONE-acetaminophen (PERCOCET/ROXICET) 5-325 MG per tablet 1 tablet, 1 tablet, Oral, Q6H PRN, Wyvonnia Dusky, MD, 1 tablet at 04/12/21 1544 .   pantoprazole (PROTONIX) EC tablet 40 mg, 40 mg, Oral, Daily, Wyvonnia Dusky, MD, 40 mg at 04/12/21 0847 .  sucralfate (CARAFATE) tablet 1 g, 1 g, Oral, QID, Wieting, Richard, MD, 1 g at 04/12/21 1451 .  umeclidinium bromide (INCRUSE ELLIPTA) 62.5 MCG/INH 1 puff, 1 puff, Inhalation, Daily, Wieting, Richard, MD, 1 puff at 04/12/21 0934    ALLERGIES   Celebrex [celecoxib] and Morphine and related     REVIEW OF SYSTEMS    Review of Systems:  Gen:  Denies  fever, sweats, chills weigh loss  HEENT: Denies blurred vision, double vision, ear pain, eye pain, hearing loss, nose bleeds, sore throat Cardiac:  No dizziness, chest pain or heaviness, chest tightness,edema Resp:   Denies cough or sputum porduction,+  shortness of breath,rare wheezing, hemoptysis,  Gi: Denies swallowing difficulty, stomach pain, nausea or vomiting, diarrhea, constipation, bowel incontinence Gu:  Denies bladder incontinence, burning urine Ext:   Denies Joint pain, stiffness or swelling Skin: Denies  skin rash, easy bruising or bleeding or hives Endoc:  Denies polyuria, polydipsia , polyphagia or weight change Psych:   Denies depression, insomnia or hallucinations   Other:  All other systems negative   VS: BP (!) 161/66 (BP Location: Right Arm)   Pulse 64   Temp 98 F (36.7 C) (Oral)   Resp 20   Ht 5' (1.524 m)   Wt 52.6 kg   SpO2 93%   BMI 22.65 kg/m      PHYSICAL EXAM    GENERAL:laying flat in bed, [place] 02 on HEAD: Normocephalic, atraumatic.  EYES: Pupils equal, round, reactive to light. Extraocular muscles intact. No scleral icterus.  MOUTH: Moist mucosal membrane. Dentition intact. No abscess noted.  EAR, NOSE, THROAT: Clear without exudates. No external lesions.  NECK: Supple. No thyromegaly. No nodules. No JVD.  PULMONARY: bilat bs, no wheeing CARDIOVASCULAR: S1 and S2. Regular rate and rhythm. No murmurs, rubs, or gallops. No edema. Pedal pulses 2+ bilaterally.  GASTROINTESTINAL: Soft,  nontender, nondistended. No masses. Positive bowel sounds. No hepatosplenomegaly.  MUSCULOSKELETAL: No swelling, clubbing, or edema. Range of motion full in all extremities.  NEUROLOGIC: Cranial nerves II through XII are intact. No gross focal neurological deficits. Sensation intact. Reflexes intact.  SKIN: No ulceration, lesions, rashes, or cyanosis. Skin warm and dry. Turgor intact.  PSYCHIATRIC: Mood, affect within normal limits. The patient is awake, alert and oriented x 3. Insight, judgment intact.       IMAGING    CT Angio Chest PE W and/or Wo Contrast  Result Date: 04/11/2021 CLINICAL DATA:  Intermittent chest pain for 1 week, hypoxia, increasing shortness of breath, nausea and vomiting, tachypnea EXAM: CT ANGIOGRAPHY CHEST CT ABDOMEN AND  PELVIS WITH CONTRAST TECHNIQUE: Multidetector CT imaging of the chest was performed using the standard protocol during bolus administration of intravenous contrast. Multiplanar CT image reconstructions and MIPs were obtained to evaluate the vascular anatomy. Multidetector CT imaging of the abdomen and pelvis was performed using the standard protocol during bolus administration of intravenous contrast. CONTRAST:  66m OMNIPAQUE IOHEXOL 350 MG/ML SOLN COMPARISON:  04/11/2021 FINDINGS: CTA CHEST FINDINGS Cardiovascular: This is a technically adequate evaluation of the pulmonary vasculature. No filling defects or pulmonary emboli. The heart is enlarged without pericardial effusion. Normal caliber of the thoracic aorta. Diffuse atherosclerosis of the aorta and coronary vasculature. Mediastinum/Nodes: No enlarged mediastinal, hilar, or axillary lymph nodes. Thyroid gland, trachea, and esophagus demonstrate no significant findings. Lungs/Pleura: Upper lobe predominant emphysema. No acute airspace disease, effusion, or pneumothorax. Central airways are widely patent. Musculoskeletal: No acute displaced fractures. Multiple prior healed left-sided rib fractures are noted.  Bilateral breast prostheses, with evidence of intracapsular rupture bilaterally. Reconstructed images demonstrate no additional findings. Review of the MIP images confirms the above findings. CT ABDOMEN and PELVIS FINDINGS Hepatobiliary: Small hepatic cysts are identified. Otherwise the liver is unremarkable. The gallbladder is grossly normal. No biliary duct dilation. Pancreas: Unremarkable. No pancreatic ductal dilatation or surrounding inflammatory changes. Spleen: Normal in size without focal abnormality. Adrenals/Urinary Tract: Multiple small left renal cortical cysts are noted, with scattered areas of left renal cortical scarring. The right kidney is unremarkable. No urinary tract calculi or obstructive uropathy. The adrenals and bladder are grossly normal. Stomach/Bowel: No bowel obstruction or ileus. Normal appendix right lower quadrant. No bowel wall thickening or inflammatory change. Vascular/Lymphatic: Extensive atherosclerosis throughout the aorta and its distal branches. No pathologic adenopathy within the abdomen or pelvis. Reproductive: Status post hysterectomy. No adnexal masses. Other: No free fluid or free gas. Small fat containing umbilical and inguinal hernias. No bowel herniation. Musculoskeletal: No acute or destructive bony lesions. Unremarkable right hip arthroplasty. Reconstructed images demonstrate no additional findings. Review of the MIP images confirms the above findings. IMPRESSION: 1. No evidence of pulmonary embolus. 2. No acute intrathoracic, intra-abdominal, or intrapelvic process. 3. Aortic Atherosclerosis (ICD10-I70.0) and Emphysema (ICD10-J43.9). 4. Cardiomegaly. Electronically Signed   By: MRanda NgoM.D.   On: 04/11/2021 17:03   CT ABDOMEN PELVIS W CONTRAST  Result Date: 04/11/2021 CLINICAL DATA:  Intermittent chest pain for 1 week, hypoxia, increasing shortness of breath, nausea and vomiting, tachypnea EXAM: CT ANGIOGRAPHY CHEST CT ABDOMEN AND PELVIS WITH CONTRAST  TECHNIQUE: Multidetector CT imaging of the chest was performed using the standard protocol during bolus administration of intravenous contrast. Multiplanar CT image reconstructions and MIPs were obtained to evaluate the vascular anatomy. Multidetector CT imaging of the abdomen and pelvis was performed using the standard protocol during bolus administration of intravenous contrast. CONTRAST:  745mOMNIPAQUE IOHEXOL 350 MG/ML SOLN COMPARISON:  04/11/2021 FINDINGS: CTA CHEST FINDINGS Cardiovascular: This is a technically adequate evaluation of the pulmonary vasculature. No filling defects or pulmonary emboli. The heart is enlarged without pericardial effusion. Normal caliber of the thoracic aorta. Diffuse atherosclerosis of the aorta and coronary vasculature. Mediastinum/Nodes: No enlarged mediastinal, hilar, or axillary lymph nodes. Thyroid gland, trachea, and esophagus demonstrate no significant findings. Lungs/Pleura: Upper lobe predominant emphysema. No acute airspace disease, effusion, or pneumothorax. Central airways are widely patent. Musculoskeletal: No acute displaced fractures. Multiple prior healed left-sided rib fractures are noted. Bilateral breast prostheses, with evidence of intracapsular rupture bilaterally. Reconstructed images demonstrate no additional findings. Review of the MIP images confirms the above  findings. CT ABDOMEN and PELVIS FINDINGS Hepatobiliary: Small hepatic cysts are identified. Otherwise the liver is unremarkable. The gallbladder is grossly normal. No biliary duct dilation. Pancreas: Unremarkable. No pancreatic ductal dilatation or surrounding inflammatory changes. Spleen: Normal in size without focal abnormality. Adrenals/Urinary Tract: Multiple small left renal cortical cysts are noted, with scattered areas of left renal cortical scarring. The right kidney is unremarkable. No urinary tract calculi or obstructive uropathy. The adrenals and bladder are grossly normal. Stomach/Bowel:  No bowel obstruction or ileus. Normal appendix right lower quadrant. No bowel wall thickening or inflammatory change. Vascular/Lymphatic: Extensive atherosclerosis throughout the aorta and its distal branches. No pathologic adenopathy within the abdomen or pelvis. Reproductive: Status post hysterectomy. No adnexal masses. Other: No free fluid or free gas. Small fat containing umbilical and inguinal hernias. No bowel herniation. Musculoskeletal: No acute or destructive bony lesions. Unremarkable right hip arthroplasty. Reconstructed images demonstrate no additional findings. Review of the MIP images confirms the above findings. IMPRESSION: 1. No evidence of pulmonary embolus. 2. No acute intrathoracic, intra-abdominal, or intrapelvic process. 3. Aortic Atherosclerosis (ICD10-I70.0) and Emphysema (ICD10-J43.9). 4. Cardiomegaly. Electronically Signed   By: Randa Ngo M.D.   On: 04/11/2021 17:03   DG Chest Port 1 View  Result Date: 04/12/2021 CLINICAL DATA:  Shortness of breath. EXAM: PORTABLE CHEST 1 VIEW COMPARISON:  CT 04/11/2021.  Chest x-ray 04/11/2021, 02/22/2013. FINDINGS: Mediastinum and hilar structures normal. Cardiomegaly. No pulmonary venous congestion. Changes of pleural-parenchymal scarring again noted. No acute infiltrate. No pleural effusion pneumothorax. Degenerative changes scoliosis thoracic spine. IMPRESSION: 1.  Cardiomegaly.  No pulmonary venous congestion. 2. Changes of pleural-parenchymal scarring. No acute pulmonary abnormality identified. Electronically Signed   By: Marcello Moores  Register   On: 04/12/2021 05:25   DG Chest Portable 1 View  Result Date: 04/11/2021 CLINICAL DATA:  Shortness of breath EXAM: PORTABLE CHEST 1 VIEW COMPARISON:  Radiograph 04/07/2014 FINDINGS: Unchanged, enlarged cardiac silhouette. There are chronic interstitial opacities bilaterally. There is no new focal airspace disease. There is no large pleural effusion or visible pneumothorax. There is bilateral shoulder  degenerative change. No acute osseous abnormality. Unchanged thoracolumbar scoliosis with multilevel degenerative disc disease. IMPRESSION: Unchanged cardiomegaly with chronic parenchymal changes. No new focal airspace consolidation. Electronically Signed   By: Maurine Simmering   On: 04/11/2021 16:03      ASSESSMENT/PLAN   This is a pleasant small frame lady who came in with hypoxia, tachycardia ? bouts of svt, in the setting of known copd. There is no pe, no acute infiltrates on chest ct, no signs of ischemia, heart looks good on echo, on 6 liters [place] 02   Hypoxia in the presence of normal cxr, no ischemia point to copd/asthma, ( fev1 1.1 liters), vascular causes (pulm htn, pe, the later of which is not present). ? Intermittent PFO.  ? Mild pulmonary edema (elevated bnp) -will treat with  xoponex 0.63 dose q six hrs via nebs, -Trelegy one puff q day -iprotropium q 6 via nebs -solumderol 40 mg iv q 12  -wean o2 to 4 liters (her baseline) -will recommend admitting -trial of lasix 20-40 mg x 1   Tachyarrythmia better today, mag being supplemented, iv cardiazem -Keep sats greater than 92 %       Thank you for allowing me to participate in the care of this patient.   Patient/Family are satisfied with care plan and all questions have been answered.  This document was prepared using Dragon voice recognition software and may include unintentional dictation errors.  Wallene Huh, M.D.  Division of [facility]

## 2021-04-12 NOTE — Consult Note (Signed)
[place] Nurse wound follow up Contacted by Bedside RN because patient's legs were itching and burning.  I have changed order to make wound contact layer plain Vaseline (white petrolatum) gauze.  [facility] nursing team will not follow, but will remain available to this patient, the nursing and medical teams.  Please re-consult if needed. Thanks, Maudie Flakes, MSN, RN, [place], Arther Abbott  Pager# [phone]

## 2021-04-12 NOTE — Progress Notes (Signed)
   04/12/21 0438  Assess: MEWS Score  Temp 98.1 F (36.7 C)  BP (!) 170/110  Pulse Rate (!) 128  Resp 18  SpO2 96 %  O2 Device HFNC  O2 Flow Rate (L/min) 6 L/min  Assess: MEWS Score  MEWS Temp 0  MEWS Systolic 0  MEWS Pulse 2  MEWS RR 0  MEWS LOC 0  MEWS Score 2  MEWS Score Color Yellow  Assess: if the MEWS score is Yellow or Red  Were vital signs taken at a resting state? Yes  Focused Assessment No change from prior assessment  Does the patient meet 2 or more of the SIRS criteria? No  MEWS guidelines implemented *See Row Information* Yes  Treat  MEWS Interventions Escalated (See documentation below)  Pain Scale 0-10  Pain Score 0  Take Vital Signs  Increase Vital Sign Frequency  Yellow: Q 2hr X 2 then Q 4hr X 2, if remains yellow, continue Q 4hrs  Escalate  MEWS: Escalate Yellow: discuss with charge nurse/RN and consider discussing with provider and RRT  Notify: Charge Nurse/RN  Name of Charge Nurse/RN Notified Ronette Deter  Date Charge Nurse/RN Notified 04/12/21  Time Charge Nurse/RN Notified [place]  Notify: Provider  Provider Name/Title Hal Hope  Date Provider Notified 04/12/21  Time Provider Notified [phone]  Notification Type Face-to-face  Notification Reason Change in status  Provider response At bedside  Date of Provider Response 04/12/21  Time of Provider Response 0440  Assess: SIRS CRITERIA  SIRS Temperature  0  SIRS Pulse 1  SIRS Respirations  0  SIRS WBC 0  SIRS Score Sum  1

## 2021-04-12 NOTE — Progress Notes (Signed)
Patient ID: [name], female   DOB: 27-Jun-1933, 87 y.o.   MRN: [id number] [facility] PROGRESS NOTE  [name] [id number] DOB: 11-17-1932 DOA: 04/11/2021 PCP: [name], MD  HPI/Subjective: Patient came in because she could not breathe and she was hypoxic at home.  Breathing a little bit better today but on high flow nasal cannula 6 L.  Not much cough.  Still with some shortness of breath but better than yesterday.  Admitted with acute on chronic hypoxic respiratory failure.  Heart rate on telemetry monitoring has been up at times.  Recently had skin cancers taken off her legs.  Objective: Vitals:   04/12/21 0740 04/12/21 1524  BP: (!) 184/75 (!) 161/66  Pulse: 64 64  Resp: 16 20  Temp: 98.2 F (36.8 C) 98 F (36.7 C)  SpO2: 95% 93%    Intake/Output Summary (Last 24 hours) at 04/12/2021 1619 Last data filed at 04/12/2021 1409 Gross per 24 hour  Intake 540 ml  Output --  Net 540 ml   Filed Weights   04/12/21 0003  Weight: 52.6 kg    ROS: Review of Systems  Respiratory:  Positive for shortness of breath.   Cardiovascular:  Negative for chest pain.  Gastrointestinal:  Negative for abdominal pain, nausea and vomiting.  Exam: Physical Exam HENT:     Head: Normocephalic.     Mouth/Throat:     Pharynx: No oropharyngeal exudate.  Eyes:     General: Lids are normal.     Conjunctiva/sclera: Conjunctivae normal.     Pupils: Pupils are equal, round, and reactive to light.  Cardiovascular:     Rate and Rhythm: Normal rate and regular rhythm.     Heart sounds: Normal heart sounds, S1 normal and S2 normal.  Pulmonary:     Breath sounds: Examination of the right-lower field reveals decreased breath sounds. Examination of the left-lower field reveals decreased breath sounds. Decreased breath sounds present. No wheezing, rhonchi or rales.  Abdominal:     Palpations: Abdomen is soft.     Tenderness: There is no abdominal tenderness.  Musculoskeletal:      Right lower leg: Swelling present.     Left lower leg: Swelling present.  Skin:    General: Skin is warm.     Findings: No rash.     Comments: Legs this morning wrapped with pressure dressing.  Neurological:     Mental Status: She is alert and oriented to person, place, and time.     Data Reviewed: Basic Metabolic Panel: Recent Labs  Lab 04/07/21 1708 04/11/21 1523 04/11/21 2152 04/12/21 0634  NA 132* 135  --  138  K 3.7 4.1  --  3.8  CL 98 97*  --  101  CO2 26 29  --  27  GLUCOSE 106* 136*  --  116*  BUN 17 20  --  25*  CREATININE 0.84 0.95  --  0.88  CALCIUM 9.0 9.6  --  8.7*  MG  --   --  1.8  --     CBC: Recent Labs  Lab 04/07/21 1708 04/11/21 1523 04/12/21 0634  WBC 7.8 9.4 5.1  NEUTROABS 6.5  --   --   HGB 10.9* 11.8* 10.3*  HCT 32.7* 36.1 31.1*  MCV 87.4 88.9 88.1  PLT 330 433* 377    BNP (last 3 results) Recent Labs    04/11/21 1535  BNP 1,878.0*     Recent Results (from the past 240 hour(s))  Culture, blood (Routine X 2) w Reflex to ID Panel     Status: None (Preliminary result)   Collection Time: 04/11/21  3:51 PM   Specimen: BLOOD  Result Value Ref Range Status   Specimen Description BLOOD BLOOD LEFT FOREARM  Final   Special Requests   Final    BOTTLES DRAWN AEROBIC AND ANAEROBIC Blood Culture adequate volume   Culture   Final    NO GROWTH < 24 HOURS Performed at [facility], [address], [place], [place] [address]    Report Status PENDING  Incomplete  Resp Panel by RT-PCR (Flu A&B, Covid) Nasopharyngeal Swab     Status: None   Collection Time: 04/11/21  3:54 PM   Specimen: Nasopharyngeal Swab; Nasopharyngeal(NP) swabs in vial transport medium  Result Value Ref Range Status   SARS Coronavirus 2 by RT PCR NEGATIVE NEGATIVE Final    Comment: (NOTE) SARS-CoV-2 target nucleic acids are NOT DETECTED.  The SARS-CoV-2 RNA is generally detectable in upper respiratory specimens during the acute phase of infection. The  lowest concentration of SARS-CoV-2 viral copies this assay can detect is 138 copies/mL. A negative result does not preclude SARS-Cov-2 infection and should not be used as the sole basis for treatment or other patient management decisions. A negative result may occur with  improper specimen collection/handling, submission of specimen other than nasopharyngeal swab, presence of viral mutation(s) within the areas targeted by this assay, and inadequate number of viral copies(<138 copies/mL). A negative result must be combined with clinical observations, patient history, and epidemiological information. The expected result is Negative.  Fact Sheet for Patients:  EntrepreneurPulse.com.au  Fact Sheet for Healthcare Providers:  IncredibleEmployment.be  This test is no t yet approved or cleared by the [place] FDA and  has been authorized for detection and/or diagnosis of SARS-CoV-2 by FDA under an Emergency Use Authorization (EUA). This EUA will remain  in effect (meaning this test can be used) for the duration of the COVID-19 declaration under Section 564(b)(1) of the Act, 21 U.S.C.section 360bbb-3(b)(1), unless the authorization is terminated  or revoked sooner.       Influenza A by PCR NEGATIVE NEGATIVE Final   Influenza B by PCR NEGATIVE NEGATIVE Final    Comment: (NOTE) The Xpert Xpress SARS-CoV-2/FLU/RSV plus assay is intended as an aid in the diagnosis of influenza from Nasopharyngeal swab specimens and should not be used as a sole basis for treatment. Nasal washings and aspirates are unacceptable for Xpert Xpress SARS-CoV-2/FLU/RSV testing.  Fact Sheet for Patients: EntrepreneurPulse.com.au  Fact Sheet for Healthcare Providers: IncredibleEmployment.be  This test is not yet approved or cleared by the [place] FDA and has been authorized for detection and/or diagnosis of SARS-CoV-2 by FDA under  an Emergency Use Authorization (EUA). This EUA will remain in effect (meaning this test can be used) for the duration of the COVID-19 declaration under Section 564(b)(1) of the Act, 21 U.S.C. section 360bbb-3(b)(1), unless the authorization is terminated or revoked.  Performed at [facility], [address] [place], [place] [address]   Culture, blood (Routine X 2) w Reflex to ID Panel     Status: None (Preliminary result)   Collection Time: 04/11/21  3:56 PM   Specimen: BLOOD  Result Value Ref Range Status   Specimen Description BLOOD RIGHT ANTECUBITAL  Final   Special Requests   Final    BOTTLES DRAWN AEROBIC AND ANAEROBIC Blood Culture adequate volume   Culture   Final    NO  GROWTH < 24 HOURS Performed at [facility], [address] [place], [place] [address]    Report Status PENDING  Incomplete     Studies: CT Angio Chest PE W and/or Wo Contrast  Result Date: 04/11/2021 CLINICAL DATA:  Intermittent chest pain for 1 week, hypoxia, increasing shortness of breath, nausea and vomiting, tachypnea EXAM: CT ANGIOGRAPHY CHEST CT ABDOMEN AND PELVIS WITH CONTRAST TECHNIQUE: Multidetector CT imaging of the chest was performed using the standard protocol during bolus administration of intravenous contrast. Multiplanar CT image reconstructions and MIPs were obtained to evaluate the vascular anatomy. Multidetector CT imaging of the abdomen and pelvis was performed using the standard protocol during bolus administration of intravenous contrast. CONTRAST:  54m OMNIPAQUE IOHEXOL 350 MG/ML SOLN COMPARISON:  04/11/2021 FINDINGS: CTA CHEST FINDINGS Cardiovascular: This is a technically adequate evaluation of the pulmonary vasculature. No filling defects or pulmonary emboli. The heart is enlarged without pericardial effusion. Normal caliber of the thoracic aorta. Diffuse atherosclerosis of the aorta and coronary vasculature. Mediastinum/Nodes: No enlarged mediastinal, hilar, or  axillary lymph nodes. Thyroid gland, trachea, and esophagus demonstrate no significant findings. Lungs/Pleura: Upper lobe predominant emphysema. No acute airspace disease, effusion, or pneumothorax. Central airways are widely patent. Musculoskeletal: No acute displaced fractures. Multiple prior healed left-sided rib fractures are noted. Bilateral breast prostheses, with evidence of intracapsular rupture bilaterally. Reconstructed images demonstrate no additional findings. Review of the MIP images confirms the above findings. CT ABDOMEN and PELVIS FINDINGS Hepatobiliary: Small hepatic cysts are identified. Otherwise the liver is unremarkable. The gallbladder is grossly normal. No biliary duct dilation. Pancreas: Unremarkable. No pancreatic ductal dilatation or surrounding inflammatory changes. Spleen: Normal in size without focal abnormality. Adrenals/Urinary Tract: Multiple small left renal cortical cysts are noted, with scattered areas of left renal cortical scarring. The right kidney is unremarkable. No urinary tract calculi or obstructive uropathy. The adrenals and bladder are grossly normal. Stomach/Bowel: No bowel obstruction or ileus. Normal appendix right lower quadrant. No bowel wall thickening or inflammatory change. Vascular/Lymphatic: Extensive atherosclerosis throughout the aorta and its distal branches. No pathologic adenopathy within the abdomen or pelvis. Reproductive: Status post hysterectomy. No adnexal masses. Other: No free fluid or free gas. Small fat containing umbilical and inguinal hernias. No bowel herniation. Musculoskeletal: No acute or destructive bony lesions. Unremarkable right hip arthroplasty. Reconstructed images demonstrate no additional findings. Review of the MIP images confirms the above findings. IMPRESSION: 1. No evidence of pulmonary embolus. 2. No acute intrathoracic, intra-abdominal, or intrapelvic process. 3. Aortic Atherosclerosis (ICD10-I70.0) and Emphysema (ICD10-J43.9).  4. Cardiomegaly. Electronically Signed   By: MRanda NgoM.D.   On: 04/11/2021 17:03   CT ABDOMEN PELVIS W CONTRAST  Result Date: 04/11/2021 CLINICAL DATA:  Intermittent chest pain for 1 week, hypoxia, increasing shortness of breath, nausea and vomiting, tachypnea EXAM: CT ANGIOGRAPHY CHEST CT ABDOMEN AND PELVIS WITH CONTRAST TECHNIQUE: Multidetector CT imaging of the chest was performed using the standard protocol during bolus administration of intravenous contrast. Multiplanar CT image reconstructions and MIPs were obtained to evaluate the vascular anatomy. Multidetector CT imaging of the abdomen and pelvis was performed using the standard protocol during bolus administration of intravenous contrast. CONTRAST:  745mOMNIPAQUE IOHEXOL 350 MG/ML SOLN COMPARISON:  04/11/2021 FINDINGS: CTA CHEST FINDINGS Cardiovascular: This is a technically adequate evaluation of the pulmonary vasculature. No filling defects or pulmonary emboli. The heart is enlarged without pericardial effusion. Normal caliber of the thoracic aorta. Diffuse atherosclerosis of the aorta and coronary vasculature. Mediastinum/Nodes: No enlarged mediastinal, hilar,  or axillary lymph nodes. Thyroid gland, trachea, and esophagus demonstrate no significant findings. Lungs/Pleura: Upper lobe predominant emphysema. No acute airspace disease, effusion, or pneumothorax. Central airways are widely patent. Musculoskeletal: No acute displaced fractures. Multiple prior healed left-sided rib fractures are noted. Bilateral breast prostheses, with evidence of intracapsular rupture bilaterally. Reconstructed images demonstrate no additional findings. Review of the MIP images confirms the above findings. CT ABDOMEN and PELVIS FINDINGS Hepatobiliary: Small hepatic cysts are identified. Otherwise the liver is unremarkable. The gallbladder is grossly normal. No biliary duct dilation. Pancreas: Unremarkable. No pancreatic ductal dilatation or surrounding inflammatory  changes. Spleen: Normal in size without focal abnormality. Adrenals/Urinary Tract: Multiple small left renal cortical cysts are noted, with scattered areas of left renal cortical scarring. The right kidney is unremarkable. No urinary tract calculi or obstructive uropathy. The adrenals and bladder are grossly normal. Stomach/Bowel: No bowel obstruction or ileus. Normal appendix right lower quadrant. No bowel wall thickening or inflammatory change. Vascular/Lymphatic: Extensive atherosclerosis throughout the aorta and its distal branches. No pathologic adenopathy within the abdomen or pelvis. Reproductive: Status post hysterectomy. No adnexal masses. Other: No free fluid or free gas. Small fat containing umbilical and inguinal hernias. No bowel herniation. Musculoskeletal: No acute or destructive bony lesions. Unremarkable right hip arthroplasty. Reconstructed images demonstrate no additional findings. Review of the MIP images confirms the above findings. IMPRESSION: 1. No evidence of pulmonary embolus. 2. No acute intrathoracic, intra-abdominal, or intrapelvic process. 3. Aortic Atherosclerosis (ICD10-I70.0) and Emphysema (ICD10-J43.9). 4. Cardiomegaly. Electronically Signed   By: Randa Ngo M.D.   On: 04/11/2021 17:03   DG Chest Port 1 View  Result Date: 04/12/2021 CLINICAL DATA:  Shortness of breath. EXAM: PORTABLE CHEST 1 VIEW COMPARISON:  CT 04/11/2021.  Chest x-ray 04/11/2021, 02/22/2013. FINDINGS: Mediastinum and hilar structures normal. Cardiomegaly. No pulmonary venous congestion. Changes of pleural-parenchymal scarring again noted. No acute infiltrate. No pleural effusion pneumothorax. Degenerative changes scoliosis thoracic spine. IMPRESSION: 1.  Cardiomegaly.  No pulmonary venous congestion. 2. Changes of pleural-parenchymal scarring. No acute pulmonary abnormality identified. Electronically Signed   By: Marcello [place]ores  Register   On: 04/12/2021 05:25   DG Chest Portable 1 View  Result Date:  04/11/2021 CLINICAL DATA:  Shortness of breath EXAM: PORTABLE CHEST 1 VIEW COMPARISON:  Radiograph 04/07/2014 FINDINGS: Unchanged, enlarged cardiac silhouette. There are chronic interstitial opacities bilaterally. There is no new focal airspace disease. There is no large pleural effusion or visible pneumothorax. There is bilateral shoulder degenerative change. No acute osseous abnormality. Unchanged thoracolumbar scoliosis with multilevel degenerative disc disease. IMPRESSION: Unchanged cardiomegaly with chronic parenchymal changes. No new focal airspace consolidation. Electronically Signed   By: Maurine Simmering   On: 04/11/2021 16:03    Scheduled Meds:  bisoprolol  5 mg Oral QHS   citalopram  20 mg Oral Daily   diltiazem  240 mg Oral Daily   enoxaparin (LOVENOX) injection  40 mg Subcutaneous Q24H   fluticasone furoate-vilanterol  1 puff Inhalation Daily   furosemide  20 mg Intravenous Once   [START ON 04/13/2021] furosemide  20 mg Oral Daily   gabapentin  200 mg Oral BID   lisinopril  40 mg Oral Daily   methylPREDNISolone (SOLU-MEDROL) injection  40 mg Intravenous Q12H   montelukast  10 mg Oral QHS   omega-3 acid ethyl esters  2 g Oral Daily   pantoprazole  40 mg Oral Daily   sucralfate  1 g Oral QID   umeclidinium bromide  1 puff Inhalation Daily   Assessment/Plan:  Acute on chronic hypoxic respiratory failure.  Patient had increasing oxygen requirements overnight and was on 6 L bubble high flow nasal cannula this morning.  Continue to try to taper to her usual 4 L nasal cannula. Episodes of sinus tachycardia.  Started Cardizem CD this morning.  We will add a little low-dose bisoprolol this evening. COPD exacerbation given 125 mg of Solu-Medrol yesterday we will give 40 mg IV every 12 hours.  Continue Trelegy inhaler and nebulizer treatments. Elevated BNP and recent echo showing a normal ejection fraction.  We will change hydrochlorothiazide over to Lasix and add low dose bisoprolol.  Patient  already on lisinopril.  We will give 1 dose of IV Lasix today. Essential hypertension.  Started Cardizem CD this morning.  Continue lisinopril.  And bisoprolol this evening.  Add Lasix.  We will hold off on hydrochlorothiazide and clonidine at this point. Bilateral lower extremity skin cancers.  Was supposed to follow-up today with dermatologist but ended up in the hospital.  Appreciate wound care consultation. Impaired fasting glucose Weakness.  Physical therapy evaluation Anxiety depression on Celexa        Code Status:     Code Status Orders  (From admission, onward)           Start     Ordered   04/11/21 1816  Full code  Continuous        04/11/21 1815           Code Status History     This patient has a current code status but no historical code status.      Family Communication: Spoke with daughter and husband Disposition Plan: Status is: Inpatient  Dispo: The patient is from: Home              Anticipated d/c is to: Home              Patient currently on high flow nasal cannula.  Will need to get back to her regular nasal cannula before any disposition.   Difficult to place patient.  No.  Consultants: Pulmonary  Time spent: 28 minutes  [place]

## 2021-04-12 NOTE — Consult Note (Signed)
[facility] Nurse Consult Note: Reason for Consult: bilateral LE wounds, full thickness.  Patent had skin cancers removed (melanoma) from right and left LEs (two on left, one on right). She was to return to the surgeon for follow up today. Family present for assessment. Wound type: full thickness, surgical removal of melanomas Pressure Injury POA:N/A Measurement: RLE:  3cm x 2cm x 0.2cm 90% pink wound bed. 10% yellow slough LLE: Lateral: 8cm x 5cm x 0.2cm 100% pink, moist wound bed. LLE: Posterior:  4cm x 4cm area with sutures. Wound bed: As described above Drainage (amount, consistency, odor) serous, moderate amount Periwound: intact with evidence of previous lesion removal. Dressing procedure/placement/frequency: Zinc paste bandages removed and dressings that are wound contact layers removed.  Wounds cleansed with NS and legs gently patted dry. Wounds covered with xeroform antimicrobial, nonadherent gauze, covered with ABD pads and secured with Kerlix roll gauze.  We will continue this twice daily while in house. Heels are to be floated. Patient to follow up with outpatient MD post discharge.  [place] nursing team will not follow, but will remain available to this patient, the nursing and medical teams.  Please re-consult if needed. Thanks, Maudie Flakes, MSN, RN, [place], Arther Abbott  Pager# [phone]

## 2021-04-13 DIAGNOSIS — J9601 Acute respiratory failure with hypoxia: Secondary | ICD-10-CM

## 2021-04-13 DIAGNOSIS — I5031 Acute diastolic (congestive) heart failure: Secondary | ICD-10-CM

## 2021-04-13 DIAGNOSIS — I479 Paroxysmal tachycardia, unspecified: Principal | ICD-10-CM

## 2021-04-13 DIAGNOSIS — F32A Depression, unspecified: Secondary | ICD-10-CM

## 2021-04-13 DIAGNOSIS — F419 Anxiety disorder, unspecified: Secondary | ICD-10-CM

## 2021-04-13 LAB — BASIC METABOLIC PANEL
Anion gap: 11 (ref 5–15)
BUN: 25 mg/dL — ABNORMAL HIGH (ref 8–23)
CO2: 30 mmol/L (ref 22–32)
Calcium: 9.3 mg/dL (ref 8.9–10.3)
Chloride: 96 mmol/L — ABNORMAL LOW (ref 98–111)
Creatinine, Ser: 0.91 mg/dL (ref 0.44–1.00)
GFR, Estimated: >60 mL/min (ref >60)
Glucose, Bld: 144 mg/dL — ABNORMAL HIGH (ref 70–99)
Potassium: 3.6 mmol/L (ref 3.5–5.1)
Sodium: 137 mmol/L (ref 135–145)

## 2021-04-13 LAB — THYROID PANEL WITH TSH
Free Thyroxine Index: 2.2 (ref 1.2–4.9)
T3 Uptake Ratio: 32 % (ref 24–39)
T4, Total: 7 ug/dL (ref 4.5–12.0)
TSH: 0.502 u[IU]/mL (ref 0.450–4.500)

## 2021-04-13 LAB — MAGNESIUM: Magnesium: 2.4 mg/dL (ref 1.7–2.4)

## 2021-04-13 MED ORDER — TIZANIDINE HCL 2 MG PO TABS
2.0000 mg | ORAL_TABLET | Freq: Three times a day (TID) | ORAL | Status: DC | PRN
  Filled 2021-04-13: qty 1

## 2021-04-13 MED ORDER — POTASSIUM CHLORIDE CRYS ER 20 MEQ PO TBCR
20.0000 meq | EXTENDED_RELEASE_TABLET | Freq: Every day | ORAL | Status: DC
  Administered 2021-04-13: 20 meq via ORAL
  Filled 2021-04-13: qty 1

## 2021-04-13 MED ORDER — BISOPROLOL FUMARATE 5 MG PO TABS: 2.5000 mg | ORAL_TABLET | Freq: Every day | ORAL | 0 refills | Status: DC

## 2021-04-13 MED ORDER — PREDNISONE 10 MG PO TABS: 40.0000 mg | ORAL_TABLET | Freq: Every day | ORAL | 0 refills | Status: AC

## 2021-04-13 MED ORDER — POTASSIUM CHLORIDE CRYS ER 20 MEQ PO TBCR: 20.0000 meq | EXTENDED_RELEASE_TABLET | Freq: Every day | ORAL | 0 refills | Status: DC

## 2021-04-13 MED ORDER — FUROSEMIDE 20 MG PO TABS: 20.0000 mg | ORAL_TABLET | Freq: Every day | ORAL | 0 refills | Status: DC

## 2021-04-13 MED ORDER — HYDROXYCHLOROQUINE SULFATE 200 MG PO TABS
200.0000 mg | ORAL_TABLET | Freq: Every day | ORAL | Status: DC
  Administered 2021-04-13: 200 mg via ORAL
  Filled 2021-04-13: qty 1

## 2021-04-13 MED ORDER — DILTIAZEM HCL ER COATED BEADS 180 MG PO CP24
180.0000 mg | ORAL_CAPSULE | Freq: Every day | ORAL | Status: DC
  Administered 2021-04-13: 180 mg via ORAL
  Filled 2021-04-13: qty 1

## 2021-04-13 MED ORDER — DILTIAZEM HCL ER COATED BEADS 180 MG PO CP24: 180.0000 mg | ORAL_CAPSULE | Freq: Every day | ORAL | 0 refills | Status: DC

## 2021-04-13 NOTE — Discharge Instructions (Signed)
Okay to increase to 6 liters with walking but go back to 4 Liters at rest

## 2021-04-13 NOTE — TOC Initial Note (Signed)
Transition of Care [facility]) - Initial/Assessment Note    Patient Details  Name: [name] MRN: [id number] Date of Birth: August 26, 1933  [facility] CM/SW Contact:    Beverly Sessions, RN Phone Number: 04/13/2021, 10:33 AM  Clinical Narrative:                  Patient admitted from home with dyspnea Patient lives at home with husband.  Husband at bedside Patient states she is on 4L continuous O2 at home, and has her portable O2 for discharge today  PT eval pending. Patient declines any PT Services at discharge Discussed home health for COPD management and wound care .  Patient declines. Patient states that she is following up with her surgeon tomorrow, and is not interested in services at discharge        Patient Goals and CMS Choice        Expected Discharge Plan and Services           Expected Discharge Date: 04/13/21                                    Prior Living Arrangements/Services                       Activities of Daily Living Home Assistive Devices/Equipment: None ADL Screening (condition at time of admission) Patient's cognitive ability adequate to safely complete daily activities?: Yes Is the patient deaf or have difficulty hearing?: No Does the patient have difficulty seeing, even when wearing glasses/contacts?: No Does the patient have difficulty concentrating, remembering, or making decisions?: No Patient able to express need for assistance with ADLs?: No Does the patient have difficulty dressing or bathing?: No Independently performs ADLs?: Yes (appropriate for developmental age) Does the patient have difficulty walking or climbing stairs?: No Weakness of Legs: None Weakness of Arms/Hands: Left  Permission Sought/Granted                  Emotional Assessment              Admission diagnosis:  Paroxysmal tachycardia ([place]) [I47.9] Dyspnea [R06.00] Acute hypoxemic respiratory failure ([place])  [J96.01] Hypertension, unspecified type [I10] Acute on chronic respiratory failure with hypoxia ([place]) [J96.21] Patient Active Problem List   Diagnosis Date Noted   Acute on chronic respiratory failure with hypoxia ([place]) 04/12/2021   Sinus tachycardia    Elevated brain natriuretic peptide (BNP) level    Weakness    Dyspnea 04/11/2021   COPD, moderate ([place]) 04/07/2014   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   Rheumatoid arthritis ([place]) 02/22/2013   Chest pain 02/22/2013   Diarrhea 02/22/2013   PERIPHERAL VASCULAR DISEASE 05/20/2007   HYPERLIPIDEMIA 05/19/2007   Essential hypertension 05/19/2007   MYOCARDIAL INFARCTION, HX OF 05/19/2007   CORONARY ARTERY DISEASE 05/19/2007   ASTHMA 05/19/2007   COPD with acute exacerbation ([place]) 05/19/2007   GERD 05/19/2007   OSTEOPOROSIS 05/19/2007   PCP:  [name], MD Pharmacy:   [facility] [address], [place] [address] Phone: [phone] Fax: [phone]     Social Determinants of Health (SDOH) Interventions    Readmission Risk Interventions No flowsheet data found.

## 2021-04-13 NOTE — Progress Notes (Signed)
Pt discharged per MD order. IV removed. Discharge instructions reviewed with pt. Pt verbalized understanding. All questions answered to pt satisfaction. Pt taken to car in wheelchair via staff wearing personal O2

## 2021-04-13 NOTE — Evaluation (Signed)
Physical Therapy Evaluation Patient Details Name: [name] MRN: [id number] DOB: 14-Mar-1933 Today's Date: 04/13/2021   History of Present Illness  Patient is a 87 year old female with asthma, COPD, CAD, HTN, who presents to ER with complains of dyspnea, wheezing, and anterior chest discomfort. Pateint required high flow oxygen with episodes of sinus tachycardia.  Clinical Impression  Patient agreeable to PT. Patient ambulated in hallway with 4 L02 without assistive device, Mod I. Sp02 85% with ambulation and increased to 90% with one minute seated rest break and cues for breathing techniques. Education provided for energy conservation techniques, pacing for endurance. Patient is likely nearing baseline level of functional mobility. No further acute PT needs at this time.     Follow Up Recommendations No PT follow up    Equipment Recommendations  None recommended by PT    Recommendations for Other Services       Precautions / Restrictions Precautions Precautions: Fall Restrictions Weight Bearing Restrictions: No      Mobility  Bed Mobility               General bed mobility comments: not observed as patient sitting up on arrival to room and post session    Transfers Overall transfer level: Modified independent               General transfer comment: no loss of balance with transfers  Ambulation/Gait Ambulation/Gait assistance: Modified independent (Device/Increase time) Gait Distance (Feet): 100 Feet Assistive device: None Gait Pattern/deviations: Step-through pattern Gait velocity: decreased   General Gait Details: no loss of balance with ambulation. cues for energy conservation techniques and pacing for endurance. Sp02 85% on 4 L02 with ambulation and increased to 90% after 1 minute seated rest break and cues for breathing techniques.  Stairs            Wheelchair Mobility    Modified Rankin (Stroke Patients Only)       Balance                                              Pertinent Vitals/Pain Pain Assessment: No/denies pain    Home Living Family/patient expects to be discharged to:: Private residence Living Arrangements: Spouse/significant other Available Help at Discharge: Family Type of Home: House Home Access: Stairs to enter   Technical brewer of Steps: 3     Additional Comments: 4 L02 at baseline    Prior Function Level of Independence: Independent               Hand Dominance        Extremity/Trunk Assessment   Upper Extremity Assessment Upper Extremity Assessment: Overall WFL for tasks assessed    Lower Extremity Assessment Lower Extremity Assessment: Overall WFL for tasks assessed       Communication   Communication: No difficulties  Cognition Arousal/Alertness: Awake/alert Behavior During Therapy: WFL for tasks assessed/performed Overall Cognitive Status: Within Functional Limits for tasks assessed                                        General Comments      Exercises     Assessment/Plan    PT Assessment Patent does not need any further PT services  PT Problem List  PT Treatment Interventions      PT Goals (Current goals can be found in the Care Plan section)  Acute Rehab PT Goals Patient Stated Goal: to go home PT Goal Formulation: With patient Time For Goal Achievement: 04/13/21 Potential to Achieve Goals: Good    Frequency     Barriers to discharge        Co-evaluation               AM-PAC PT "6 Clicks" Mobility  Outcome Measure Help needed turning from your back to your side while in a flat bed without using bedrails?: None Help needed moving from lying on your back to sitting on the side of a flat bed without using bedrails?: None Help needed moving to and from a bed to a chair (including a wheelchair)?: None Help needed standing up from a chair using your arms (e.g., wheelchair or bedside chair)?:  None Help needed to walk in hospital room?: None Help needed climbing 3-5 steps with a railing? : None 6 Click Score: 24    End of Session Equipment Utilized During Treatment: Oxygen;Gait belt Activity Tolerance: Patient tolerated treatment well Patient left:  (sitting up on edge of bed) Nurse Communication: Mobility status PT Visit Diagnosis: Unsteadiness on feet (R26.81)    Time: [id number] PT Time Calculation (min) (ACUTE ONLY): 25 min   Charges:   PT Evaluation $PT Eval Low Complexity: 1 Low PT Treatments $Therapeutic Activity: 8-22 mins        Minna Merritts, PT, MPT  Percell Locus 04/13/2021, 12:54 PM

## 2021-04-13 NOTE — Discharge Summary (Signed)
[place] [facility] [place] NAME: [name]    MR#:  [id number]  DATE OF BIRTH:  May 18, 1933  DATE OF ADMISSION:  04/11/2021 ADMITTING PHYSICIAN: Loletha Grayer, MD  DATE OF DISCHARGE: 04/13/2021 12:32 PM  PRIMARY CARE PHYSICIAN: [name], MD    ADMISSION DIAGNOSIS:  Paroxysmal tachycardia ([place]) [I47.9] Dyspnea [R06.00] Acute hypoxemic respiratory failure ([place]) [J96.01] Hypertension, unspecified type [I10] Acute on chronic respiratory failure with hypoxia ([place]) [J96.21]  DISCHARGE DIAGNOSIS:  Active Problems:   Essential hypertension   COPD with acute exacerbation (HCC)   Dyspnea   Acute on chronic respiratory failure with hypoxia (HCC)   Sinus tachycardia   Elevated brain natriuretic peptide (BNP) level   Weakness   SECONDARY DIAGNOSIS:   Past Medical History:  Diagnosis Date  . Asthma   . Hypertension   . Melanoma ([place])    scalp (2007) & leg (2013)  . Myocardial infarction ([place]) 1999   no PCI or CABG  . Osteoporosis   . Rheumatoid arthritis [facility])     HOSPITAL COURSE:   Acute on chronic hypoxic respiratory failure.  The patient had increasing oxygen requirements during the hospital course and was on high flow bubble nasal cannula 6 L.  We were able to taper down to the usual 4 L.  She did have some hypoxia with walking but felt well.  She can go up to 6 L with walking but at rest go back down to 4 L. Episodes of sinus tachycardia.  I started Cardizem CD and low-dose bisoprolol in the evening.  Heart rate better.  Continue to watch as outpatient and may be able to taper Cardizem CD further. COPD exacerbation.  She was given 125 mg of Solu-Medrol and 40 mg IV every 12 hours here in the hospital.  Continue Trelegy inhaler and nebulizer treatments.  2 more days of prednisone upon going home. Acute diastolic congestive heart failure.  Patient had elevated BNP.  I did give a dose of IV Lasix.  We will give p.o. Lasix  upon discharge home. Patient on bisoprolol and lisinopril. Essential hypertension.  Blood pressure variable during the hospital course.  With changes in medications I did stop clonidine.  Continue bisoprolol, Cardizem CD Lasix and lisinopril for now.  Close clinical follow-up as outpatient with PMD to recheck blood pressure. Bilateral lower extremity skin cancers.  She was supposed to follow-up with her dermatologist on 04/12/2021.  She was able to make an appointment tomorrow for suture removal. Impaired fasting glucose Patient did well with physical therapy Anxiety depression on Celexa  DISCHARGE CONDITIONS:   Satisfactory  CONSULTS OBTAINED:  Pulmonary  DRUG ALLERGIES:   Allergies  Allergen Reactions  . Celebrex [Celecoxib] Other (See Comments)    Heart stops  . Morphine And Related     Hallucinations     DISCHARGE MEDICATIONS:   Allergies as of 04/13/2021       Reactions   Celebrex [celecoxib] Other (See Comments)   Heart stops   Morphine And Related    Hallucinations        Medication List     STOP taking these medications    albuterol (2.5 MG/3ML) 0.083% nebulizer solution Commonly known as: PROVENTIL   cloNIDine 0.1 MG tablet Commonly known as: CATAPRES   hydrochlorothiazide 25 MG tablet Commonly known as: HYDRODIURIL       TAKE these medications    acetaminophen-codeine 300-30 MG tablet Commonly known as: TYLENOL #3 Take 1 tablet  by mouth every 4 (four) hours as needed.   bisoprolol 5 MG tablet Commonly known as: ZEBETA Take 0.5 tablets (2.5 mg total) by mouth at bedtime.   citalopram 20 MG tablet Commonly known as: CELEXA Take 20 mg by mouth daily.   diltiazem 180 MG 24 hr capsule Commonly known as: CARDIZEM CD Take 1 capsule (180 mg total) by mouth daily. Start taking on: April 14, 2021   fish oil-omega-3 fatty acids 1000 MG capsule Take 2 g by mouth daily.   furosemide 20 MG tablet Commonly known as: LASIX Take 1 tablet (20 mg  total) by mouth daily. Start taking on: April 14, 2021   hydroxychloroquine 200 MG tablet Commonly known as: PLAQUENIL Take 200 mg by mouth daily.   ipratropium-albuterol 0.5-2.5 (3) MG/3ML Soln Commonly known as: DUONEB Take 3 mLs by nebulization every 6 (six) hours as needed.   lisinopril 40 MG tablet Commonly known as: ZESTRIL Take 40 mg by mouth daily.   montelukast 10 MG tablet Commonly known as: SINGULAIR Take 10 mg by mouth at bedtime.   nitroGLYCERIN 0.4 MG SL tablet Commonly known as: NITROSTAT Place 1 tablet (0.4 mg total) under the tongue every 5 (five) minutes as needed for chest pain.   omeprazole 20 MG capsule Commonly known as: PRILOSEC Take 20 mg by mouth 2 (two) times daily before a meal.   potassium chloride SA 20 MEQ tablet Commonly known as: KLOR-CON Take 1 tablet (20 mEq total) by mouth daily. Start taking on: April 14, 2021   predniSONE 10 MG tablet Commonly known as: DELTASONE Take 4 tablets (40 mg total) by mouth daily for 2 days.   sucralfate 1 g tablet Commonly known as: CARAFATE Take 1 g by mouth 4 (four) times daily.   tizanidine 2 MG capsule Commonly known as: ZANAFLEX Take 2 mg by mouth 3 (three) times daily.   Trelegy Ellipta 100-62.5-25 MCG/INH Aepb Generic drug: Fluticasone-Umeclidin-Vilant Take 1 puff by mouth daily.   VITAMIN D PO Take 25 mcg by mouth daily.         DISCHARGE INSTRUCTIONS:   Follow-up with PMD 5 days Follow-up with your dermatologist tomorrow with reschedule appointment to remove sutures.  If you experience worsening of your admission symptoms, develop shortness of breath, life threatening emergency, suicidal or homicidal thoughts you must seek medical attention immediately by calling 911 or calling your MD immediately  if symptoms less severe.  You Must read complete instructions/literature along with all the possible adverse reactions/side effects for all the Medicines you take and that have been  prescribed to you. Take any new Medicines after you have completely understood and accept all the possible adverse reactions/side effects.   Please note  You were cared for by a hospitalist during your hospital stay. If you have any questions about your discharge medications or the care you received while you were in the hospital after you are discharged, you can call the unit and asked to speak with the hospitalist on call if the hospitalist that took care of you is not available. Once you are discharged, your primary care physician will handle any further medical issues. Please note that NO REFILLS for any discharge medications will be authorized once you are discharged, as it is imperative that you return to your primary care physician (or establish a relationship with a primary care physician if you do not have one) for your aftercare needs so that they can reassess your need for medications and monitor your lab  values.    Today   CHIEF COMPLAINT:   Chief Complaint  Patient presents with  . Chest Pain    HISTORY OF PRESENT ILLNESS:  [name]  is a 87 y.o. female initially came in with chest pain and shortness of breath   VITAL SIGNS:  Blood pressure (!) 175/67, pulse 61, temperature 98.6 F (37 C), resp. rate 18, height 5' (1.524 m), weight 52.6 kg, SpO2 95 %.  I/O:   Intake/Output Summary (Last 24 hours) at 04/13/2021 1756 Last data filed at 04/12/2021 1838 Gross per 24 hour  Intake 58.03 ml  Output --  Net 58.03 ml    PHYSICAL EXAMINATION:  GENERAL:  87 y.o.-year-old patient lying in the bed with no acute distress.  EYES: Pupils equal, round, reactive to light and accommodation. No scleral icterus. HEENT: Head atraumatic, normocephalic. Oropharynx and nasopharynx clear.  LUNGS: Decreased breath sounds bilaterally, no wheezing, rales,rhonchi or crepitation. No use of accessory muscles of respiration.  CARDIOVASCULAR: S1, S2 normal. No murmurs, rubs, or gallops.   ABDOMEN: Soft, non-tender, non-distended.  EXTREMITIES: No pedal edema.  NEUROLOGIC: Cranial nerves II through XII are intact. Muscle strength 5/5 in all extremities. Sensation intact. Gait not checked.  PSYCHIATRIC: The patient is alert and oriented x 3.  SKIN: No obvious rash, lesion, or ulcer.   DATA REVIEW:   CBC Recent Labs  Lab 04/12/21 0634  WBC 5.1  HGB 10.3*  HCT 31.1*  PLT 377    Chemistries  Recent Labs  Lab 04/13/21 0438  NA 137  K 3.6  CL 96*  CO2 30  GLUCOSE 144*  BUN 25*  CREATININE 0.91  CALCIUM 9.3  MG 2.4     Microbiology Results  Results for orders placed or performed during the hospital encounter of 04/11/21  Culture, blood (Routine X 2) w Reflex to ID Panel     Status: None (Preliminary result)   Collection Time: 04/11/21  3:51 PM   Specimen: BLOOD  Result Value Ref Range Status   Specimen Description BLOOD BLOOD LEFT FOREARM  Final   Special Requests   Final    BOTTLES DRAWN AEROBIC AND ANAEROBIC Blood Culture adequate volume   Culture   Final    NO GROWTH 2 DAYS Performed at [facility], [address], [place], [place] [address]    Report Status PENDING  Incomplete  Resp Panel by RT-PCR (Flu A&B, Covid) Nasopharyngeal Swab     Status: None   Collection Time: 04/11/21  3:54 PM   Specimen: Nasopharyngeal Swab; Nasopharyngeal(NP) swabs in vial transport medium  Result Value Ref Range Status   SARS Coronavirus 2 by RT PCR NEGATIVE NEGATIVE Final    Comment: (NOTE) SARS-CoV-2 target nucleic acids are NOT DETECTED.  The SARS-CoV-2 RNA is generally detectable in upper respiratory specimens during the acute phase of infection. The lowest concentration of SARS-CoV-2 viral copies this assay can detect is 138 copies/mL. A negative result does not preclude SARS-Cov-2 infection and should not be used as the sole basis for treatment or other patient management decisions. A negative result may occur with  improper specimen  collection/handling, submission of specimen other than nasopharyngeal swab, presence of viral mutation(s) within the areas targeted by this assay, and inadequate number of viral copies(<138 copies/mL). A negative result must be combined with clinical observations, patient history, and epidemiological information. The expected result is Negative.  Fact Sheet for Patients:  EntrepreneurPulse.com.au  Fact Sheet for Healthcare Providers:  IncredibleEmployment.be  This test is  no t yet approved or cleared by the [place] and  has been authorized for detection and/or diagnosis of SARS-CoV-2 by FDA under an Emergency Use Authorization (EUA). This EUA will remain  in effect (meaning this test can be used) for the duration of the COVID-19 declaration under Section 564(b)(1) of the Act, 21 U.S.C.section 360bbb-3(b)(1), unless the authorization is terminated  or revoked sooner.       Influenza A by PCR NEGATIVE NEGATIVE Final   Influenza B by PCR NEGATIVE NEGATIVE Final    Comment: (NOTE) The Xpert Xpress SARS-CoV-2/FLU/RSV plus assay is intended as an aid in the diagnosis of influenza from Nasopharyngeal swab specimens and should not be used as a sole basis for treatment. Nasal washings and aspirates are unacceptable for Xpert Xpress SARS-CoV-2/FLU/RSV testing.  Fact Sheet for Patients: EntrepreneurPulse.com.au  Fact Sheet for Healthcare Providers: IncredibleEmployment.be  This test is not yet approved or cleared by the [place] FDA and has been authorized for detection and/or diagnosis of SARS-CoV-2 by FDA under an Emergency Use Authorization (EUA). This EUA will remain in effect (meaning this test can be used) for the duration of the COVID-19 declaration under Section 564(b)(1) of the Act, 21 U.S.C. section 360bbb-3(b)(1), unless the authorization is terminated or revoked.  Performed at [facility], [address] [place], [place] [address]   Culture, blood (Routine X 2) w Reflex to ID Panel     Status: None (Preliminary result)   Collection Time: 04/11/21  3:56 PM   Specimen: BLOOD  Result Value Ref Range Status   Specimen Description BLOOD RIGHT ANTECUBITAL  Final   Special Requests   Final    BOTTLES DRAWN AEROBIC AND ANAEROBIC Blood Culture adequate volume   Culture   Final    NO GROWTH 2 DAYS Performed at [facility], [address] [place], [place] [address]    Report Status PENDING  Incomplete    RADIOLOGY:  DG Chest Port 1 View  Result Date: 04/12/2021 CLINICAL DATA:  Shortness of breath. EXAM: PORTABLE CHEST 1 VIEW COMPARISON:  CT 04/11/2021.  Chest x-ray 04/11/2021, 02/22/2013. FINDINGS: Mediastinum and hilar structures normal. Cardiomegaly. No pulmonary venous congestion. Changes of pleural-parenchymal scarring again noted. No acute infiltrate. No pleural effusion pneumothorax. Degenerative changes scoliosis thoracic spine. IMPRESSION: 1.  Cardiomegaly.  No pulmonary venous congestion. 2. Changes of pleural-parenchymal scarring. No acute pulmonary abnormality identified. Electronically Signed   By: Marcello Moores  Register   On: 04/12/2021 05:25     Management plans discussed with the patient, family and they are in agreement.  CODE STATUS:  Code Status History     Date Active Date Inactive Code Status Order ID Comments User Context   04/11/2021 1815 04/13/2021 1739 Full Code [id number]  Wyvonnia Dusky, MD ED      Questions for Most Recent Historical Code Status (Order [id number])        TOTAL TIME TAKING CARE OF THIS PATIENT: 35 minutes.    Loletha Grayer M.D on 04/13/2021 at 5:56 PM  [facility]  CC: Primary care physician; Jackelyn Hoehn, Karis Juba, MD

## 2021-04-16 LAB — CULTURE, BLOOD (ROUTINE X 2)
Culture: NO GROWTH
Culture: NO GROWTH
Special Requests: ADEQUATE
Special Requests: ADEQUATE

## 2021-04-19 NOTE — Progress Notes (Signed)
Patient ID: [name], female    DOB: 02/08/1933, 87 y.o.   MRN: [id number]  HPI  [name] is a 87 y/o female with a history of asthma, melanoma, HTN, COPD, SVT, CAD, RA, remote tobacco use and chronic heart failure.   Echo report from 02/23/21 reviewed and showed an EF of 55-60% along with myxomatous mitral valve along with mild AR.    Admitted 04/11/21 due to HF/ COPD exacerbation. Initially high flow nasal cannula at 6L but then weaned down to 4L at rest. Cardizem and bisoprolol started for ST. Solu-medrol and nebulizer given. Pulmonology and wound consult obtained. Given IV lasix with transition to oral diuretics. Clonidine stopped due to labile BP. Discharged after 2 days. Was in the ED 04/07/21 due to vomiting where she was evaluated and released.   She presents today for her initial visit with a chief complaint of minimal shortness of breath upon moderate exertion. She says that this has been chronic in nature for years but is improved from her recent admission. She has associated easy bruising & diarrhea along with this. She denies any difficulty sleeping, abdominal distention, palpitations, pedal edema, chest pain, dizziness, cough, fatigue or weight gain.   Had stitches removed from bilateral shins yesterday after skin cancers were removed.   Past Medical History:  Diagnosis Date   Anxiety    Asthma    CHF (congestive heart failure) ([place])    COPD (chronic obstructive pulmonary disease) ([place])    Hypertension    Melanoma ([place])    scalp (2007) & leg (2013)   Myocardial infarction ([place]) 09/10/1997   no PCI or CABG   Osteoporosis    Rheumatoid arthritis (HCC)    SVT (supraventricular tachycardia) ([place])    Past Surgical History:  Procedure Laterality Date   ABDOMINAL HYSTERECTOMY     BREAST SURGERY     CARDIAC SURGERY     CARPAL TUNNEL RELEASE     MELANOMA EXCISION  2007   scalp   MELANOMA EXCISION Right 2013   lower leg   SHOULDER SURGERY     TOTAL HIP REVISION   2015   VIDEO BRONCHOSCOPY Bilateral 04/08/2014   Procedure: VIDEO BRONCHOSCOPY WITHOUT FLUORO;  Surgeon: Juanito Doom, MD;  Location: [facility] ENDOSCOPY;  Service: Cardiopulmonary;  Laterality: Bilateral;   History reviewed. No pertinent family history. Social History   Tobacco Use   Smoking status: Former    Packs/day: 1.50    Years: 22.00    Pack years: 33.00    Types: Cigarettes    Quit date: 04/07/1974    Years since quitting: 47.0   Smokeless tobacco: Never  Substance Use Topics   Alcohol use: No   Allergies  Allergen Reactions   Celebrex [Celecoxib] Other (See Comments)    Heart stops   Morphine And Related     Hallucinations    Prior to Admission medications   Medication Sig Start Date End Date Taking? Authorizing Provider  acetaminophen (TYLENOL) 325 MG tablet Take 650 mg by mouth every 6 (six) hours as needed.   Yes [provider]  bisoprolol (ZEBETA) 5 MG tablet Take 0.5 tablets (2.5 mg total) by mouth at bedtime. 04/13/21  Yes Wieting, Richard, MD  Cholecalciferol (VITAMIN D PO) Take 25 mcg by mouth daily.   Yes [provider]  citalopram (CELEXA) 20 MG tablet Take 20 mg by mouth daily.   Yes [provider]  diltiazem (CARDIZEM CD) 180 MG 24 hr capsule Take 1 capsule (  180 mg total) by mouth daily. 04/14/21  Yes Wieting, Richard, MD  fish oil-omega-3 fatty acids 1000 MG capsule Take 2 g by mouth daily.   Yes [provider]  furosemide (LASIX) 20 MG tablet Take 1 tablet (20 mg total) by mouth daily. 04/14/21  Yes Wieting, Richard, MD  ipratropium-albuterol (DUONEB) 0.5-2.5 (3) MG/3ML SOLN Take 3 mLs by nebulization every 6 (six) hours as needed.   Yes [provider]  lisinopril (ZESTRIL) 40 MG tablet Take 40 mg by mouth daily. 04/03/21  Yes [provider]  montelukast (SINGULAIR) 10 MG tablet Take 10 mg by mouth at bedtime.   Yes [provider]  nitroGLYCERIN (NITROSTAT) 0.4 MG SL tablet Place 1 tablet (0.4 mg  total) under the tongue every 5 (five) minutes as needed for chest pain. 02/23/13  Yes Ivor Costa, MD  omeprazole (PRILOSEC) 20 MG capsule Take 20 mg by mouth 2 (two) times daily before a meal.   Yes [provider]  potassium chloride SA (KLOR-CON) 20 MEQ tablet Take 1 tablet (20 mEq total) by mouth daily. 04/14/21  Yes Wieting, Richard, MD  sucralfate (CARAFATE) 1 g tablet Take 1 g by mouth 4 (four) times daily. 03/07/21  Yes [provider]  TRELEGY ELLIPTA 100-62.5-25 MCG/INH AEPB Take 1 puff by mouth daily. 03/13/21  Yes [provider]  acetaminophen-codeine (TYLENOL #3) 300-30 MG tablet Take 1 tablet by mouth every 4 (four) hours as needed. Patient not taking: Reported on 04/20/2021 04/05/21   [provider]  tizanidine (ZANAFLEX) 2 MG capsule Take 2 mg by mouth 3 (three) times daily.    [provider]  losartan (COZAAR) 50 MG tablet Take 50 mg by mouth daily. Patient not taking: Reported on 04/11/2021  04/12/21  [provider]  metoprolol succinate (TOPROL-XL) 100 MG 24 hr tablet Take 100 mg by mouth 2 (two) times daily. Take with or immediately following a meal. Patient not taking: Reported on 04/11/2021  04/12/21  [provider]    Review of Systems  Constitutional:  Negative for appetite change and fatigue.  HENT:  Negative for congestion, postnasal drip and sore throat.   Respiratory:  Positive for shortness of breath. Negative for cough and wheezing.   Cardiovascular:  Negative for chest pain, palpitations and leg swelling.  Gastrointestinal:  Positive for diarrhea. Negative for abdominal distention and abdominal pain.  Endocrine: Negative.   Genitourinary: Negative.   Musculoskeletal:  Negative for back pain and neck pain.  Skin:        Bruising along arms  Allergic/Immunologic: Negative.   Neurological:  Negative for dizziness and light-headedness.  Hematological:  Negative for adenopathy. Bruises/bleeds easily.   Psychiatric/Behavioral:  Negative for dysphoric mood and sleep disturbance (sleeping on 1 pillow). The patient is nervous/anxious.    Vitals:   04/20/21 1054  BP: (!) 149/76  Pulse: (!) 57  Resp: 18  SpO2: 99%  Weight: 111 lb 4 oz (50.5 kg)  Height: 5' (1.524 m)   Wt Readings from Last 3 Encounters:  04/20/21 111 lb 4 oz (50.5 kg)  04/12/21 115 lb 15.4 oz (52.6 kg)  04/07/21 110 lb (49.9 kg)   Lab Results  Component Value Date   CREATININE 0.91 04/13/2021   CREATININE 0.88 04/12/2021   CREATININE 0.95 04/11/2021    Physical Exam Vitals and nursing note reviewed. Exam conducted with a chaperone present (significant other).  Constitutional:      Appearance: Normal appearance.  HENT:  Head: Normocephalic and atraumatic.  Cardiovascular:     Rate and Rhythm: Regular rhythm. Bradycardia present.  Pulmonary:     Effort: Pulmonary effort is normal. No respiratory distress.     Breath sounds: No wheezing or rales.  Abdominal:     General: There is no distension.     Palpations: Abdomen is soft.     Tenderness: There is no abdominal tenderness.  Musculoskeletal:     Cervical back: Normal range of motion and neck supple.     Right lower leg: No edema.     Left lower leg: No edema.  Skin:    General: Skin is warm and dry.     Findings: Bruising (bilateral arms) present.  Neurological:     General: No focal deficit present.     Mental Status: She is alert and oriented to person, place, and time.  Psychiatric:        Mood and Affect: Mood normal.        Behavior: Behavior normal.        Thought Content: Thought content normal.    Assessment & Plan:  1: Chronic heart failure with preserved ejection fraction without structural changes- - NYHA class II - euvolemic today - not weighing daily and she was encouraged to begin so that she can call for an overnight weight gain of > 2 pounds or a weekly weight gain of > 5 pounds - not adding salt to her foods - discussed  that she may want to get established with a local cardiologist but she defers at this time as she doesn't want to see anymore doctors - BNP 04/11/21 was 1878.0  2: HTN- - BP mildly elevated today but she's having an "upset stomach" and she's nervous about this - saw PCP Olen Pel) 04/18/21 - BMP 04/13/21 reviewed and showed sodium 137, potassium 3.6, creatinine 0.91 and GFR >60  3:COPD- - saw pulmonology Raul Del) 01/23/21; returns 04/25/21 - wears oxygen at 3L around the clock - using nebulizer PRN  4: Anxiety- - currently taking citalopram   Patient did not bring her medications nor a list. Each medication was verbally reviewed with the patient and she was encouraged to bring the bottles to every visit to confirm accuracy of list.   Patient opts to not make a return appointment at this time. Advised patient that she could call back at anytime for any questions, issues or if she needed to make another appointment and she was comfortable with this plan.

## 2021-04-20 ENCOUNTER — Ambulatory Visit: Payer: Medicare HMO | Attending: Family | Admitting: Family

## 2021-04-20 ENCOUNTER — Other Ambulatory Visit: Payer: Self-pay

## 2021-04-20 ENCOUNTER — Encounter: Payer: Self-pay | Admitting: Family

## 2021-04-20 VITALS — BP 149/76 | HR 57 | Resp 18 | Ht 60.0 in | Wt 111.2 lb

## 2021-04-20 DIAGNOSIS — F419 Anxiety disorder, unspecified: Secondary | ICD-10-CM | POA: Diagnosis not present

## 2021-04-20 DIAGNOSIS — K30 Functional dyspepsia: Secondary | ICD-10-CM | POA: Insufficient documentation

## 2021-04-20 DIAGNOSIS — Z885 Allergy status to narcotic agent status: Secondary | ICD-10-CM | POA: Insufficient documentation

## 2021-04-20 DIAGNOSIS — Z886 Allergy status to analgesic agent status: Secondary | ICD-10-CM | POA: Diagnosis not present

## 2021-04-20 DIAGNOSIS — J441 Chronic obstructive pulmonary disease with (acute) exacerbation: Secondary | ICD-10-CM | POA: Diagnosis not present

## 2021-04-20 DIAGNOSIS — I110 Hypertensive heart disease with heart failure: Secondary | ICD-10-CM | POA: Insufficient documentation

## 2021-04-20 DIAGNOSIS — Z87891 Personal history of nicotine dependence: Secondary | ICD-10-CM | POA: Insufficient documentation

## 2021-04-20 DIAGNOSIS — I10 Essential (primary) hypertension: Secondary | ICD-10-CM

## 2021-04-20 DIAGNOSIS — J449 Chronic obstructive pulmonary disease, unspecified: Secondary | ICD-10-CM

## 2021-04-20 DIAGNOSIS — R197 Diarrhea, unspecified: Secondary | ICD-10-CM | POA: Insufficient documentation

## 2021-04-20 DIAGNOSIS — I5032 Chronic diastolic (congestive) heart failure: Secondary | ICD-10-CM | POA: Insufficient documentation

## 2021-04-20 DIAGNOSIS — F32A Depression, unspecified: Secondary | ICD-10-CM

## 2021-04-20 NOTE — Patient Instructions (Addendum)
Continue weighing daily and call for an overnight weight gain of > 2 pounds or a weekly weight gain of >5 pounds.    Call us in the future if you need us for anything  

## 2021-04-21 NOTE — ED Provider Notes (Signed)
[facility] Emergency Department Provider Note ____________________________________________  Time seen: 1705  I have reviewed the triage vital signs and the nursing notes.  HISTORY  Chief Complaint  Emesis and Rash   HPI [name] is a 87 y.o. female to the ED at the advice of [facility].  They reported that the patient may be septic, as suggested she report to the ED for further evaluation.  The patient reports she experienced a sudden episode of nonbloody, nonbilious emesis, after eating breakfast this morning.  She notes she has been strong retching related to the vomiting episode, and apparently developed some spots to her face.  When she presented herself to [facility], day suggested that the spots could be evidence that she was septic, despite her denying fevers, chills, chest pain, shortness of breath.  Patient denies any current vomiting, and otherwise denies any abdominal pain.  Patient had dermatology procedures a week earlier, to remove concerning lesions from her lower extremities, but denies any complaints related to the procedure, and has intact dressings.  Past Medical History:  Diagnosis Date   Anxiety    Asthma    CHF (congestive heart failure) ([place])    COPD (chronic obstructive pulmonary disease) ([place])    Hypertension    Melanoma ([place])    scalp (2007) & leg (2013)   Myocardial infarction ([place]) 09/10/1997   no PCI or CABG   Osteoporosis    Rheumatoid arthritis (HCC)    SVT (supraventricular tachycardia) [facility])     Patient Active Problem List   Diagnosis Date Noted   Acute hypoxemic respiratory failure (HCC)    Paroxysmal tachycardia (HCC)    Acute diastolic CHF (congestive heart failure) ([place])    Anxiety and depression    Acute on chronic respiratory failure with hypoxia ([place]) 04/12/2021   Sinus tachycardia    Elevated brain natriuretic peptide (BNP) level    Weakness    Dyspnea 04/11/2021   COPD, moderate ([place]) 04/07/2014   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   Rheumatoid arthritis ([place]) 02/22/2013   Chest pain 02/22/2013   Diarrhea 02/22/2013   PERIPHERAL VASCULAR DISEASE 05/20/2007   HYPERLIPIDEMIA 05/19/2007   Essential hypertension 05/19/2007   MYOCARDIAL INFARCTION, HX OF 05/19/2007   CORONARY ARTERY DISEASE 05/19/2007   ASTHMA 05/19/2007   COPD with acute exacerbation ([place]) 05/19/2007   GERD 05/19/2007   OSTEOPOROSIS 05/19/2007    Past Surgical History:  Procedure Laterality Date   ABDOMINAL HYSTERECTOMY     BREAST SURGERY     CARDIAC SURGERY     CARPAL TUNNEL RELEASE     MELANOMA EXCISION  2007   scalp   MELANOMA EXCISION Right 2013   lower leg   SHOULDER SURGERY     TOTAL HIP REVISION  2015   VIDEO BRONCHOSCOPY Bilateral 04/08/2014   Procedure: VIDEO BRONCHOSCOPY WITHOUT FLUORO;  Surgeon: Juanito Doom, MD;  Location: [facility] ENDOSCOPY;  Service: Cardiopulmonary;  Laterality: Bilateral;    Prior to Admission medications   Medication Sig Start Date End Date Taking? Authorizing Provider  acetaminophen (TYLENOL) 325 MG tablet Take 650 mg by mouth every 6 (six) hours as needed.    [provider]  acetaminophen-codeine (TYLENOL #3) 300-30 MG tablet Take 1 tablet by mouth every 4 (four) hours as needed. Patient not taking: Reported on 04/20/2021 04/05/21   [provider]  bisoprolol (ZEBETA) 5 MG tablet Take 0.5 tablets (2.5 mg total) by mouth at bedtime. 04/13/21   Loletha Grayer, MD  Cholecalciferol (VITAMIN D  PO) Take 25 mcg by mouth daily.    [provider]  citalopram (CELEXA) 20 MG tablet Take 20 mg by mouth daily.    [provider]  diltiazem (CARDIZEM CD) 180 MG 24 hr capsule Take 1 capsule (180 mg total) by mouth daily. 04/14/21   Loletha Grayer, MD  fish oil-omega-3 fatty acids 1000 MG capsule Take 2 g by mouth daily.    [provider]  furosemide (LASIX) 20 MG tablet Take 1 tablet (20 mg total) by mouth daily. 04/14/21   Loletha Grayer, MD   ipratropium-albuterol (DUONEB) 0.5-2.5 (3) MG/3ML SOLN Take 3 mLs by nebulization every 6 (six) hours as needed.    [provider]  lisinopril (ZESTRIL) 40 MG tablet Take 40 mg by mouth daily. 04/03/21   [provider]  montelukast (SINGULAIR) 10 MG tablet Take 10 mg by mouth at bedtime.    [provider]  nitroGLYCERIN (NITROSTAT) 0.4 MG SL tablet Place 1 tablet (0.4 mg total) under the tongue every 5 (five) minutes as needed for chest pain. 02/23/13   Ivor Costa, MD  omeprazole (PRILOSEC) 20 MG capsule Take 20 mg by mouth 2 (two) times daily before a meal.    [provider]  potassium chloride SA (KLOR-CON) 20 MEQ tablet Take 1 tablet (20 mEq total) by mouth daily. 04/14/21   Loletha Grayer, MD  sucralfate (CARAFATE) 1 g tablet Take 1 g by mouth 4 (four) times daily. 03/07/21   [provider]  tizanidine (ZANAFLEX) 2 MG capsule Take 2 mg by mouth 3 (three) times daily.    [provider]  TRELEGY ELLIPTA 100-62.5-25 MCG/INH AEPB Take 1 puff by mouth daily. 03/13/21   [provider]  losartan (COZAAR) 50 MG tablet Take 50 mg by mouth daily. Patient not taking: Reported on 04/11/2021  04/12/21  [provider]  metoprolol succinate (TOPROL-XL) 100 MG 24 hr tablet Take 100 mg by mouth 2 (two) times daily. Take with or immediately following a meal. Patient not taking: Reported on 04/11/2021  04/12/21  [provider]    Allergies Celebrex [celecoxib] and Morphine and related  History reviewed. No pertinent family history.  Social History Social History   Tobacco Use   Smoking status: Former    Packs/day: 1.50    Years: 22.00    Pack years: 33.00    Types: Cigarettes    Quit date: 04/07/1974    Years since quitting: 47.0   Smokeless tobacco: Never  Substance Use Topics   Alcohol use: No   Drug use: No    Review of Systems  Constitutional: Negative for fever. Eyes: Negative for visual changes. ENT:  Negative for sore throat. Cardiovascular: Negative for chest pain. Respiratory: Negative for shortness of breath. Gastrointestinal: Negative for abdominal pain, and diarrhea.  Reports strong retching and vomiting episode Genitourinary: Negative for dysuria. Musculoskeletal: Negative for back pain. Skin: Negative for rash. Neurological: Negative for headaches, focal weakness or numbness. ____________________________________________  PHYSICAL EXAM:  VITAL SIGNS: ED Triage Vitals  Enc Vitals Group     BP 04/07/21 1653 (!) 182/64     Pulse Rate 04/07/21 1653 71     Resp 04/07/21 1653 18     Temp 04/07/21 1653 98.9 F (37.2 C)     Temp Source 04/07/21 1653 Oral     SpO2 04/07/21 1653 90 %     Weight 04/07/21 1657 110 lb (49.9 kg)     Height 04/07/21 1657 5' (1.524 m)  Head Circumference --      Peak Flow --      Pain Score 04/07/21 1654 0     Pain Loc --      Pain Edu? --      Excl. in [place]? --     Constitutional: Alert and oriented. Well appearing and in no distress. Head: Normocephalic and atraumatic. Eyes: Conjunctivae are normal. PERRL. Normal extraocular movements Nose: No congestion/rhinorrhea/epistaxis. Mouth/Throat: Mucous membranes are moist.  No oral lesions or petechiae appreciated. Neck: Supple. No thyromegaly. Hematological/Lymphatic/Immunological: No cervical lymphadenopathy. Cardiovascular: Normal rate, regular rhythm. Normal distal pulses. Respiratory: Normal respiratory effort. No wheezes/rales/rhonchi. Gastrointestinal: Soft and nontender. No distention. Musculoskeletal: Nontender with normal range of motion in all extremities.  Neurologic:  Normal speech and language. No gross focal neurologic deficits are appreciated. Skin:  Skin is warm, dry and intact.  Patient with multiple petechiae noted to the central face and neck. Psychiatric: Mood and affect are normal. Patient exhibits appropriate insight and  judgment. ____________________________________________    {LABS (pertinent positives/negatives)  Labs Reviewed  BASIC METABOLIC PANEL - Abnormal; Notable for the following components:      Result Value   Sodium 132 (*)    Glucose, Bld 106 (*)    All other components within normal limits  CBC WITH DIFFERENTIAL/PLATELET - Abnormal; Notable for the following components:   RBC 3.74 (*)    Hemoglobin 10.9 (*)    HCT 32.7 (*)    All other components within normal limits  LACTIC ACID, PLASMA  ____________________________________________  {EKG  ____________________________________________   RADIOLOGY Official radiology report(s): No results found. ____________________________________________  PROCEDURES   NS 500 ml bolus IVP Zofran 4 mg ODT  Procedures ____________________________________________   INITIAL IMPRESSION / ASSESSMENT AND PLAN / ED COURSE  As part of my medical decision making, I reviewed the following data within the [place] reviewed WNL and Notes from prior ED visits     DDX: petechial eruption, purpura, DIC, Thrombocytopenia  Patient with ED evaluation of sudden onset petechia to the face, following an episode of vigorous emesis postprandial.  Patient has been stable in the interim, presents to the ED for evaluation of possible sepsis.  Patient was evaluated for complaint, and found be stable in the ED.  She was resolved her symptoms of nausea after Zofran and a fluid bolus.  Labs are reassuring as it shows an elevated white count, no thrombocytopenia, and normal white count and lactic.  Patient's clinical picture is likely petechial eruption secondary to emesis.  She is discharged at this time stable condition, and will follow-up with primary provider for ongoing symptoms.  Return precautions have been reviewed.    [name] was evaluated in Emergency Department on 04/21/2021 for the symptoms described in the history of present  illness. She was evaluated in the context of the global COVID-19 pandemic, which necessitated consideration that the patient might be at risk for infection with the SARS-CoV-2 virus that causes COVID-19. Institutional protocols and algorithms that pertain to the evaluation of patients at risk for COVID-19 are in a state of rapid change based on information released by regulatory bodies including the CDC and federal and state organizations. These policies and algorithms were followed during the patient's care in the ED. ____________________________________________  FINAL CLINICAL IMPRESSION(S) / ED DIAGNOSES  Final diagnoses:  Non-intractable vomiting with nausea, unspecified vomiting type  Petechial eruption      Yarithza Mink, Dannielle Karvonen, PA-C 04/21/21 2210  Delman Kitten, MD 04/26/21 2110

## 2021-11-14 ENCOUNTER — Inpatient Hospital Stay
Admission: EM | Admit: 2021-11-14 | Discharge: 2021-11-22 | DRG: 189 | Disposition: A | Discharge status: Home Health | Payer: Medicare HMO | Attending: Internal Medicine | Admitting: Internal Medicine

## 2021-11-14 ENCOUNTER — Other Ambulatory Visit: Payer: Self-pay

## 2021-11-14 ENCOUNTER — Encounter: Payer: Self-pay | Admitting: Emergency Medicine

## 2021-11-14 ENCOUNTER — Emergency Department: Payer: Medicare HMO

## 2021-11-14 DIAGNOSIS — Z9981 Dependence on supplemental oxygen: Secondary | ICD-10-CM

## 2021-11-14 DIAGNOSIS — N179 Acute kidney failure, unspecified: Secondary | ICD-10-CM | POA: Diagnosis present

## 2021-11-14 DIAGNOSIS — R002 Palpitations: Secondary | ICD-10-CM | POA: Diagnosis not present

## 2021-11-14 DIAGNOSIS — K219 Gastro-esophageal reflux disease without esophagitis: Secondary | ICD-10-CM | POA: Diagnosis present

## 2021-11-14 DIAGNOSIS — R54 Age-related physical debility: Secondary | ICD-10-CM | POA: Diagnosis present

## 2021-11-14 DIAGNOSIS — I110 Hypertensive heart disease with heart failure: Secondary | ICD-10-CM | POA: Diagnosis present

## 2021-11-14 DIAGNOSIS — E8809 Other disorders of plasma-protein metabolism, not elsewhere classified: Secondary | ICD-10-CM | POA: Diagnosis not present

## 2021-11-14 DIAGNOSIS — I2510 Atherosclerotic heart disease of native coronary artery without angina pectoris: Secondary | ICD-10-CM | POA: Diagnosis present

## 2021-11-14 DIAGNOSIS — I080 Rheumatic disorders of both mitral and aortic valves: Secondary | ICD-10-CM | POA: Diagnosis present

## 2021-11-14 DIAGNOSIS — I471 Supraventricular tachycardia, unspecified: Secondary | ICD-10-CM

## 2021-11-14 DIAGNOSIS — Z90710 Acquired absence of both cervix and uterus: Secondary | ICD-10-CM

## 2021-11-14 DIAGNOSIS — D649 Anemia, unspecified: Secondary | ICD-10-CM

## 2021-11-14 DIAGNOSIS — R700 Elevated erythrocyte sedimentation rate: Secondary | ICD-10-CM | POA: Diagnosis not present

## 2021-11-14 DIAGNOSIS — Z87891 Personal history of nicotine dependence: Secondary | ICD-10-CM

## 2021-11-14 DIAGNOSIS — Z6821 Body mass index (BMI) 21.0-21.9, adult: Secondary | ICD-10-CM

## 2021-11-14 DIAGNOSIS — Z85820 Personal history of malignant melanoma of skin: Secondary | ICD-10-CM | POA: Diagnosis not present

## 2021-11-14 DIAGNOSIS — J189 Pneumonia, unspecified organism: Secondary | ICD-10-CM

## 2021-11-14 DIAGNOSIS — Z515 Encounter for palliative care: Secondary | ICD-10-CM | POA: Diagnosis not present

## 2021-11-14 DIAGNOSIS — I248 Other forms of acute ischemic heart disease: Secondary | ICD-10-CM | POA: Diagnosis present

## 2021-11-14 DIAGNOSIS — Z85048 Personal history of other malignant neoplasm of rectum, rectosigmoid junction, and anus: Secondary | ICD-10-CM

## 2021-11-14 DIAGNOSIS — I5033 Acute on chronic diastolic (congestive) heart failure: Secondary | ICD-10-CM | POA: Diagnosis present

## 2021-11-14 DIAGNOSIS — R7989 Other specified abnormal findings of blood chemistry: Secondary | ICD-10-CM

## 2021-11-14 DIAGNOSIS — F419 Anxiety disorder, unspecified: Secondary | ICD-10-CM | POA: Diagnosis present

## 2021-11-14 DIAGNOSIS — I493 Ventricular premature depolarization: Secondary | ICD-10-CM | POA: Diagnosis present

## 2021-11-14 DIAGNOSIS — Z886 Allergy status to analgesic agent status: Secondary | ICD-10-CM

## 2021-11-14 DIAGNOSIS — Z7983 Long term (current) use of bisphosphonates: Secondary | ICD-10-CM

## 2021-11-14 DIAGNOSIS — M069 Rheumatoid arthritis, unspecified: Secondary | ICD-10-CM | POA: Diagnosis present

## 2021-11-14 DIAGNOSIS — Z885 Allergy status to narcotic agent status: Secondary | ICD-10-CM

## 2021-11-14 DIAGNOSIS — J439 Emphysema, unspecified: Secondary | ICD-10-CM | POA: Diagnosis present

## 2021-11-14 DIAGNOSIS — E860 Dehydration: Secondary | ICD-10-CM | POA: Diagnosis present

## 2021-11-14 DIAGNOSIS — E876 Hypokalemia: Secondary | ICD-10-CM

## 2021-11-14 DIAGNOSIS — Z20822 Contact with and (suspected) exposure to covid-19: Secondary | ICD-10-CM | POA: Diagnosis present

## 2021-11-14 DIAGNOSIS — R627 Adult failure to thrive: Secondary | ICD-10-CM

## 2021-11-14 DIAGNOSIS — M810 Age-related osteoporosis without current pathological fracture: Secondary | ICD-10-CM | POA: Diagnosis present

## 2021-11-14 DIAGNOSIS — J441 Chronic obstructive pulmonary disease with (acute) exacerbation: Secondary | ICD-10-CM | POA: Diagnosis not present

## 2021-11-14 DIAGNOSIS — Z7189 Other specified counseling: Secondary | ICD-10-CM | POA: Diagnosis not present

## 2021-11-14 DIAGNOSIS — I5032 Chronic diastolic (congestive) heart failure: Secondary | ICD-10-CM

## 2021-11-14 DIAGNOSIS — I10 Essential (primary) hypertension: Secondary | ICD-10-CM | POA: Diagnosis present

## 2021-11-14 DIAGNOSIS — Z888 Allergy status to other drugs, medicaments and biological substances status: Secondary | ICD-10-CM

## 2021-11-14 DIAGNOSIS — R001 Bradycardia, unspecified: Secondary | ICD-10-CM | POA: Diagnosis not present

## 2021-11-14 DIAGNOSIS — M25552 Pain in left hip: Secondary | ICD-10-CM

## 2021-11-14 DIAGNOSIS — I252 Old myocardial infarction: Secondary | ICD-10-CM | POA: Diagnosis not present

## 2021-11-14 DIAGNOSIS — Z79899 Other long term (current) drug therapy: Secondary | ICD-10-CM

## 2021-11-14 DIAGNOSIS — J9621 Acute and chronic respiratory failure with hypoxia: Secondary | ICD-10-CM | POA: Diagnosis present

## 2021-11-14 DIAGNOSIS — R778 Other specified abnormalities of plasma proteins: Secondary | ICD-10-CM

## 2021-11-14 DIAGNOSIS — J9601 Acute respiratory failure with hypoxia: Principal | ICD-10-CM

## 2021-11-14 LAB — TROPONIN I (HIGH SENSITIVITY)
Troponin I (High Sensitivity): 161 ng/L (ref <18)
Troponin I (High Sensitivity): 175 ng/L (ref <18)
Troponin I (High Sensitivity): 156 ng/L (ref <18)

## 2021-11-14 LAB — COMPREHENSIVE METABOLIC PANEL
ALT: 17 U/L (ref 0–44)
AST: 35 U/L (ref 15–41)
Albumin: 3.6 g/dL (ref 3.5–5.0)
Alkaline Phosphatase: 66 U/L (ref 38–126)
Anion gap: 13 (ref 5–15)
BUN: 58 mg/dL — ABNORMAL HIGH (ref 8–23)
CO2: 25 mmol/L (ref 22–32)
Calcium: 9.2 mg/dL (ref 8.9–10.3)
Chloride: 96 mmol/L — ABNORMAL LOW (ref 98–111)
Creatinine, Ser: 1.5 mg/dL — ABNORMAL HIGH (ref 0.44–1.00)
GFR, Estimated: 34 mL/min — ABNORMAL LOW (ref >60)
Glucose, Bld: 156 mg/dL — ABNORMAL HIGH (ref 70–99)
Potassium: 3.5 mmol/L (ref 3.5–5.1)
Sodium: 134 mmol/L — ABNORMAL LOW (ref 135–145)
Total Bilirubin: 0.6 mg/dL (ref 0.3–1.2)
Total Protein: 7.4 g/dL (ref 6.5–8.1)

## 2021-11-14 LAB — PROTIME-INR
INR: 1.2 (ref 0.8–1.2)
Prothrombin Time: 15.3 seconds — ABNORMAL HIGH (ref 11.4–15.2)

## 2021-11-14 LAB — LACTIC ACID, PLASMA: Lactic Acid, Venous: 1.8 mmol/L (ref 0.5–1.9)

## 2021-11-14 LAB — RESP PANEL BY RT-PCR (FLU A&B, COVID) ARPGX2
Influenza A by PCR: NEGATIVE
Influenza B by PCR: NEGATIVE
SARS Coronavirus 2 by RT PCR: NEGATIVE

## 2021-11-14 LAB — CBC
HCT: 34.5 % — ABNORMAL LOW (ref 36.0–46.0)
Hemoglobin: 10.7 g/dL — ABNORMAL LOW (ref 12.0–15.0)
MCH: 26.2 pg (ref 26.0–34.0)
MCHC: 31 g/dL (ref 30.0–36.0)
MCV: 84.4 fL (ref 80.0–100.0)
Platelets: 403 10*3/uL — ABNORMAL HIGH (ref 150–400)
RBC: 4.09 MIL/uL (ref 3.87–5.11)
RDW: 14.6 % (ref 11.5–15.5)
WBC: 22.2 10*3/uL — ABNORMAL HIGH (ref 4.0–10.5)
nRBC: 0 % (ref 0.0–0.2)

## 2021-11-14 LAB — BLOOD GAS, VENOUS

## 2021-11-14 LAB — PROCALCITONIN: Procalcitonin: 27.29 ng/mL

## 2021-11-14 LAB — APTT: aPTT: 48 seconds — ABNORMAL HIGH (ref 24–36)

## 2021-11-14 MED ORDER — SODIUM CHLORIDE 0.9 % IV SOLN: 500.0000 mg | INTRAVENOUS | Status: DC

## 2021-11-14 MED ORDER — SODIUM CHLORIDE 0.9 % IV SOLN
500.0000 mg | INTRAVENOUS | Status: DC
  Administered 2021-11-15: 500 mg via INTRAVENOUS
  Filled 2021-11-14 (×2): qty 5

## 2021-11-14 MED ORDER — SODIUM CHLORIDE 0.9 % IV SOLN: 1.0000 g | INTRAVENOUS | Status: DC

## 2021-11-14 MED ORDER — ENOXAPARIN SODIUM 40 MG/0.4ML IJ SOSY: 40.0000 mg | PREFILLED_SYRINGE | INTRAMUSCULAR | Status: DC

## 2021-11-14 MED ORDER — SODIUM CHLORIDE 0.9 % IV SOLN
1.0000 g | INTRAVENOUS | Status: DC
  Administered 2021-11-15 – 2021-11-19 (×5): 1 g via INTRAVENOUS
  Filled 2021-11-14 (×5): qty 10
  Filled 2021-11-14: qty 1

## 2021-11-14 MED ORDER — METHYLPREDNISOLONE SODIUM SUCC 125 MG IJ SOLR
125.0000 mg | Freq: Once | INTRAMUSCULAR | Status: AC
  Administered 2021-11-14: 125 mg via INTRAVENOUS
  Filled 2021-11-14: qty 2

## 2021-11-14 MED ORDER — CITALOPRAM HYDROBROMIDE 20 MG PO TABS
20.0000 mg | ORAL_TABLET | Freq: Every day | ORAL | Status: DC
Start: 2021-11-15 — End: 2021-11-22
  Administered 2021-11-15 – 2021-11-22 (×8): 20 mg via ORAL
  Filled 2021-11-14 (×8): qty 1

## 2021-11-14 MED ORDER — NITROGLYCERIN 0.4 MG SL SUBL: 0.4000 mg | SUBLINGUAL_TABLET | SUBLINGUAL | Status: DC | PRN

## 2021-11-14 MED ORDER — UMECLIDINIUM BROMIDE 62.5 MCG/ACT IN AEPB
1.0000 | INHALATION_SPRAY | Freq: Every day | RESPIRATORY_TRACT | Status: DC
Start: 2021-11-15 — End: 2021-11-22
  Administered 2021-11-16 – 2021-11-22 (×7): 1 via RESPIRATORY_TRACT
  Filled 2021-11-14 (×2): qty 7

## 2021-11-14 MED ORDER — PANTOPRAZOLE SODIUM 40 MG PO TBEC
40.0000 mg | DELAYED_RELEASE_TABLET | Freq: Every day | ORAL | Status: DC
  Administered 2021-11-15 – 2021-11-22 (×8): 40 mg via ORAL
  Filled 2021-11-14 (×8): qty 1

## 2021-11-14 MED ORDER — HEPARIN (PORCINE) 25000 UT/250ML-% IV SOLN
950.0000 [IU]/h | INTRAVENOUS | Status: DC
  Administered 2021-11-14: 600 [IU]/h via INTRAVENOUS
  Administered 2021-11-15: 950 [IU]/h via INTRAVENOUS
  Filled 2021-11-14 (×2): qty 250

## 2021-11-14 MED ORDER — IPRATROPIUM-ALBUTEROL 0.5-2.5 (3) MG/3ML IN SOLN
3.0000 mL | RESPIRATORY_TRACT | Status: DC
  Administered 2021-11-14 – 2021-11-15 (×4): 3 mL via RESPIRATORY_TRACT
  Filled 2021-11-14 (×4): qty 3

## 2021-11-14 MED ORDER — ACETAMINOPHEN 325 MG PO TABS
650.0000 mg | ORAL_TABLET | Freq: Four times a day (QID) | ORAL | Status: DC | PRN
  Administered 2021-11-16 – 2021-11-22 (×7): 650 mg via ORAL
  Filled 2021-11-14 (×7): qty 2

## 2021-11-14 MED ORDER — IPRATROPIUM-ALBUTEROL 0.5-2.5 (3) MG/3ML IN SOLN
3.0000 mL | Freq: Once | RESPIRATORY_TRACT | Status: AC
  Administered 2021-11-14: 3 mL via RESPIRATORY_TRACT
  Filled 2021-11-14: qty 3

## 2021-11-14 MED ORDER — SODIUM CHLORIDE 0.9 % IV SOLN
500.0000 mg | Freq: Once | INTRAVENOUS | Status: AC
  Administered 2021-11-14: 500 mg via INTRAVENOUS
  Filled 2021-11-14: qty 5

## 2021-11-14 MED ORDER — LACTATED RINGERS IV SOLN: INTRAVENOUS | Status: DC

## 2021-11-14 MED ORDER — HEPARIN BOLUS VIA INFUSION
3000.0000 [IU] | Freq: Once | INTRAVENOUS | Status: AC
  Administered 2021-11-14: 3000 [IU] via INTRAVENOUS
  Filled 2021-11-14: qty 3000

## 2021-11-14 MED ORDER — PANTOPRAZOLE SODIUM 40 MG PO TBEC: 40.0000 mg | DELAYED_RELEASE_TABLET | Freq: Every day | ORAL | Status: DC

## 2021-11-14 MED ORDER — METHYLPREDNISOLONE SODIUM SUCC 125 MG IJ SOLR
120.0000 mg | Freq: Two times a day (BID) | INTRAMUSCULAR | Status: DC
  Administered 2021-11-15 – 2021-11-17 (×5): 120 mg via INTRAVENOUS
  Filled 2021-11-14 (×5): qty 2

## 2021-11-14 MED ORDER — IPRATROPIUM-ALBUTEROL 0.5-2.5 (3) MG/3ML IN SOLN
3.0000 mL | Freq: Once | RESPIRATORY_TRACT | Status: AC
  Administered 2021-11-14: 3 mL via RESPIRATORY_TRACT

## 2021-11-14 MED ORDER — SODIUM CHLORIDE 0.9 % IV BOLUS
500.0000 mL | Freq: Once | INTRAVENOUS | Status: AC
  Administered 2021-11-14: 500 mL via INTRAVENOUS

## 2021-11-14 MED ORDER — SODIUM CHLORIDE 0.9 % IV SOLN
2.0000 g | Freq: Once | INTRAVENOUS | Status: AC
  Administered 2021-11-14: 2 g via INTRAVENOUS
  Filled 2021-11-14: qty 2

## 2021-11-14 MED ORDER — SUCRALFATE 1 G PO TABS
1.0000 g | ORAL_TABLET | Freq: Four times a day (QID) | ORAL | Status: DC
  Administered 2021-11-14 – 2021-11-22 (×26): 1 g via ORAL
  Filled 2021-11-14 (×28): qty 1

## 2021-11-14 MED ORDER — METHYLPREDNISOLONE SODIUM SUCC 125 MG IJ SOLR: 60.0000 mg | Freq: Four times a day (QID) | INTRAMUSCULAR | Status: DC

## 2021-11-14 MED ORDER — CITALOPRAM HYDROBROMIDE 20 MG PO TABS: 20.0000 mg | ORAL_TABLET | Freq: Every day | ORAL | Status: DC

## 2021-11-14 MED ORDER — ALENDRONATE SODIUM 70 MG PO TABS: 70.0000 mg | ORAL_TABLET | ORAL | Status: DC

## 2021-11-14 MED ORDER — MONTELUKAST SODIUM 10 MG PO TABS
10.0000 mg | ORAL_TABLET | Freq: Every day | ORAL | Status: DC
  Administered 2021-11-14 – 2021-11-21 (×8): 10 mg via ORAL
  Filled 2021-11-14 (×8): qty 1

## 2021-11-14 MED ORDER — FLUTICASONE FUROATE-VILANTEROL 100-25 MCG/ACT IN AEPB
1.0000 | INHALATION_SPRAY | Freq: Every day | RESPIRATORY_TRACT | Status: DC
Start: 2021-11-15 — End: 2021-11-22
  Administered 2021-11-16 – 2021-11-22 (×7): 1 via RESPIRATORY_TRACT
  Filled 2021-11-14 (×2): qty 28

## 2021-11-14 NOTE — ED Provider Notes (Signed)
? ?[facility] ?Provider Note ? ? ? Event Date/Time  ? First MD Initiated Contact with Patient 11/14/21 1525   ?  (approximate) ? ? ?History  ? ?Shortness of Breath ? ?Level V caveat:  Resp distress ? ?HPI ? ?[name] is a 88 y.o. female with a history of severe COPD emphysema as well as reported history of CHF presents to the ER for shortness of breath productive cough for the past 2 to 3 days.  Does wear 3 L nasal cannula at home.  Is coughing up thick phlegm.  Family checked her pulse ox and states has been in the 70s all day.  Patient only able to speak in 1 or 2 word responses. ?  ? ? ?Physical Exam  ? ?Triage Vital Signs: ?ED Triage Vitals  ?Enc Vitals Group  ?   BP 11/14/21 1517 (!) 138/57  ?   Pulse Rate 11/14/21 1517 69  ?   Resp --   ?   Temp 11/14/21 1517 98.2 ?F (36.8 ?C)  ?   Temp Source 11/14/21 1517 Oral  ?   SpO2 11/14/21 1517 94 %  ?   Weight 11/14/21 1518 111 lb 5.3 oz (50.5 kg)  ?   Height 11/14/21 1518 5' (1.524 m)  ?   Head Circumference --   ?   Peak Flow --   ?   Pain Score 11/14/21 1517 6  ?   Pain Loc --   ?   Pain Edu? --   ?   Excl. in [place]? --   ? ? ?Most recent vital signs: ?Vitals:  ? 11/14/21 1517 11/14/21 1545  ?BP: (!) 138/57   ?Pulse: 69   ?Temp: 98.2 ?F (36.8 ?C)   ?SpO2: 94% 100%  ? ? ? ?Constitutional: Alert  mod resp distress ?Eyes: Conjunctivae are normal.  ?Head: Atraumatic. ?Nose: No congestion/rhinnorhea. ?Mouth/Throat: Mucous membranes are moist.   ?Neck: Painless ROM.  ?Cardiovascular:   Good peripheral circulation. ?Respiratory: Tachypneic, tripoding, prolonged expiratory phase with diminished breath sounds on expiration speaking only 1 or 2 word responses.  ?Gastrointestinal: Soft and nontender.  ?Musculoskeletal:  no deformity ?Neurologic:  MAE spontaneously. No gross focal neurologic deficits are appreciated.  ?Skin:  Skin is warm, dry and intact. No rash noted. ?Psychiatric: Mood and affect are normal. Speech and behavior are  normal. ? ? ? ?ED Results / Procedures / Treatments  ? ?Labs ?(all labs ordered are listed, but only abnormal results are displayed) ?Labs Reviewed  ?CBC - Abnormal; Notable for the following components:  ?    Result Value  ? WBC 22.2 (*)   ? Hemoglobin 10.7 (*)   ? HCT 34.5 (*)   ? Platelets 403 (*)   ? All other components within normal limits  ?COMPREHENSIVE METABOLIC PANEL - Abnormal; Notable for the following components:  ? Sodium 134 (*)   ? Chloride 96 (*)   ? Glucose, Bld 156 (*)   ? BUN 58 (*)   ? Creatinine, Ser 1.50 (*)   ? GFR, Estimated 34 (*)   ? All other components within normal limits  ?BLOOD GAS, VENOUS - Abnormal; Notable for the following components:  ? Bicarbonate 29.7 (*)   ? Acid-Base Excess 3.7 (*)   ? All other components within normal limits  ?RESP PANEL BY RT-PCR (FLU A&B, COVID) [id number]  ?CULTURE, BLOOD (ROUTINE X 2)  ?CULTURE, BLOOD (ROUTINE X 2)  ?LACTIC ACID, PLASMA  ? ? ? ?EKG ? ?  ED ECG REPORT ?I, Merlyn Lot, the attending physician, personally viewed and interpreted this ECG. ? ? Date: 11/14/2021 ? EKG Time: 15:44 ? Rate: 95 ? Rhythm: sinus ? Axis: left ? Intervals: normal qt ? ST&T Change: non specific st abn, no stemi ? ? ? ?RADIOLOGY ?Mix one tablespoon with 8oz of your favorite juice or water every day until you are having soft formed stools. Then start taking once daily if you didn't have a stool the day before. ? ? ? ?PROCEDURES: ? ?Critical Care performed: Yes, see critical care procedure note(s) ? ?.Critical Care ?Performed by: Merlyn Lot, MD ?Authorized by: Merlyn Lot, MD  ? ?Critical care provider statement:  ?  Critical care time (minutes):  35 ?  Critical care was necessary to treat or prevent imminent or life-threatening deterioration of the following conditions:  Respiratory failure ?  Critical care was time spent personally by me on the following activities:  Ordering and performing treatments and interventions, ordering and review of laboratory  studies, ordering and review of radiographic studies, pulse oximetry, re-evaluation of patient's condition, review of old charts, obtaining history from patient or surrogate, examination of patient, evaluation of patient's response to treatment, discussions with primary provider, discussions with consultants and development of treatment plan with patient or surrogate ?.1-3 Lead EKG Interpretation ?Performed by: Merlyn Lot, MD ?Authorized by: Merlyn Lot, MD  ? ?  Interpretation: normal   ?  ECG rate:  90 ?  ECG rate assessment: normal   ?  Rhythm: sinus rhythm   ? ? ?MEDICATIONS ORDERED IN ED: ?Medications  ?lactated ringers infusion ( Intravenous New Bag/Given 11/14/21 1644)  ?ceFEPIme (MAXIPIME) 2 g in sodium chloride 0.9 % 100 mL IVPB (2 g Intravenous New Bag/Given 11/14/21 1644)  ?azithromycin (ZITHROMAX) 500 mg in sodium chloride 0.9 % 250 mL IVPB (has no administration in time range)  ?ipratropium-albuterol (DUONEB) 0.5-2.5 (3) MG/3ML nebulizer solution 3 mL (3 mLs Nebulization Given 11/14/21 1538)  ?ipratropium-albuterol (DUONEB) 0.5-2.5 (3) MG/3ML nebulizer solution 3 mL (3 mLs Nebulization Given 11/14/21 1538)  ?methylPREDNISolone sodium succinate (SOLU-MEDROL) 125 mg/2 mL injection 125 mg (125 mg Intravenous Given 11/14/21 1547)  ?sodium chloride 0.9 % bolus 500 mL (500 mLs Intravenous New Bag/Given 11/14/21 1642)  ? ? ? ?IMPRESSION / MDM / ASSESSMENT AND PLAN / ED COURSE  ?I reviewed the triage vital signs and the nursing notes. ?             ?               ? ?Differential diagnosis includes, but is not limited to, Asthma, copd, CHF, pna, ptx, malignancy, Pe, anemia ? ?Patient presenting in moderate to severe respiratory distress as described above.  Patient placed on BiPAP will give nebulizers as well as IV Solu-Medrol as a suspect COPD exacerbation but given her productive cough also possible pneumonia.  Have a lower suspicion for PE given lack of pain and exam findings consistent with COPD.  The  patient will be placed on continuous pulse oximetry and telemetry for monitoring.  Laboratory evaluation will be sent to evaluate for the above complaints.   ? ? ? ?Clinical Course as of 11/14/21 1649  ?Tue Nov 14, 2021  ?1557 Chest x-ray by my review does not show any evidence of pneumothorax. [PR]  ?[place].  Feels significantly improved now speaking in complete sentences but on BiPAP.  Chest x-ray read as possible infiltrate given her white count acute hypoxia respiratory failure requiring BiPAP have  covered with IV antibiotics. [PR]  ?1638 Blood work with mild AKI. Giving IVF.   At this point believe she stable appropriate for admission to the hospital. [PR]  ?[place] was consulted and agrees to admit patient to their service. [PR]  ?  ?Clinical Course User Index ?[PR] Merlyn Lot, MD  ? ? ? ?FINAL CLINICAL IMPRESSION(S) / ED DIAGNOSES  ? ?Final diagnoses:  ?Acute respiratory failure with hypoxia ([place])  ?COPD exacerbation ([place])  ? ? ? ?Rx / DC Orders  ? ?ED Discharge Orders   ? ? None  ? ?  ? ? ? ?Note:  This document was prepared using Dragon voice recognition software and may include unintentional dictation errors. ? ?  ?Merlyn Lot, MD ?11/14/21 1650 ? ?

## 2021-11-14 NOTE — H&P (Addendum)
History and Physical    [name] JKD:[id number] DOB: 01/28/1933 DOA: 11/14/2021  PCP: [name], MD  Patient coming from: Home  Chief Complaint: Shortness of breath, cough with expectoration of sputum  HPI: [name] is a 88 y.o. female with medical history significant of coronary artery disease, hypertension, COPD with chronic respiratory failure on 4 L of oxygen via nasal cannula, rheumatoid arthritis, osteoporosis, GERD presented to the emergency room today with complaints of shortness of breath.  Patient's husband is on bedside and provided the history currently the patient is on BiPAP and is not able to talk freely.  The husband reported that patient had been complaining of some left hip pain for which they saw the doctor who did some imaging and diagnosed that there was a crack in the hip bone and was prescribed some medication.  Patient took that medication and reportedly that Sunday night she started experiencing worsening of shortness of breath.  Over the next 24 to 48 hours her shortness of breath worsened associated with cough and expectoration of yellowish sputum.  She however did not have any fevers or chest pain or palpitations.  They tried to treat with DuoNebs but did not offer any relief.  The patient's husband has a doctor's home visit today the doctor who came to see the husband noted that the wife was very sick and directed her to the emergency room.  Patient upon evaluation here was significantly hypoxic despite being on 4 L and had to be placed on BiPAP.  Currently patient is on IPAP of 10 and EPAP of 5 with FiO2 of 40% maintaining oxygen saturation of 100%.  Chest x-ray that was done which showed signs of possible atelectasis versus infiltrate Patient had significant wheezing was treated with breathing treatments.  Lab work also revealed patient had significantly elevated leukocytosis at with mildly elevated creatinine.  Patient was treated with IV  antibiotics cefepime and azithromycin and currently the hospitalist team is admitting this patient for acute on chronic hypoxic respiratory failure secondary to COPD exacerbation/community-acquired pneumonia.  Review of Systems: As per HPI otherwise all other systems reviewed and are negative.  Past Medical History:  Diagnosis Date   Anxiety    Asthma    CHF (congestive heart failure) ([place])    COPD (chronic obstructive pulmonary disease) ([place])    Hypertension    Melanoma ([place])    scalp (2007) & leg (2013)   Myocardial infarction ([place]) 09/10/1997   no PCI or CABG   Osteoporosis    Rheumatoid arthritis (HCC)    SVT (supraventricular tachycardia) ([place])     Past Surgical History:  Procedure Laterality Date   ABDOMINAL HYSTERECTOMY     BREAST SURGERY     CARDIAC SURGERY     CARPAL TUNNEL RELEASE     MELANOMA EXCISION  2007   scalp   MELANOMA EXCISION Right 2013   lower leg   SHOULDER SURGERY     TOTAL HIP REVISION  2015   VIDEO BRONCHOSCOPY Bilateral 04/08/2014   Procedure: VIDEO BRONCHOSCOPY WITHOUT FLUORO;  Surgeon: Juanito Doom, MD;  Location: [facility] ENDOSCOPY;  Service: Cardiopulmonary;  Laterality: Bilateral;    Social History  reports that she quit smoking about 47 years ago. Her smoking use included cigarettes. She has a 33.00 pack-year smoking history. She has never used smokeless tobacco. She reports that she does not drink alcohol and does not use drugs.  Allergies  Allergen Reactions   Celebrex [Celecoxib] Other (See  Comments)    Heart stops   Morphine And Related     Hallucinations     No family history on file.  Reviewed Currently not found to be contributory  Prior to Admission medications   Medication Sig Start Date End Date Taking? Authorizing Provider  acetaminophen (TYLENOL) 325 MG tablet Take 650 mg by mouth every 6 (six) hours as needed.   Yes [provider]  albuterol (PROVENTIL) (2.5 MG/3ML) 0.083% nebulizer solution Inhale 3 mLs into  the lungs every 6 (six) hours as needed. 11/07/21  Yes [provider]  Cholecalciferol (VITAMIN D PO) Take 25 mcg by mouth daily.   Yes [provider]  citalopram (CELEXA) 20 MG tablet Take 20 mg by mouth daily.   Yes [provider]  fish oil-omega-3 fatty acids 1000 MG capsule Take 2 g by mouth daily.   Yes [provider]  hydrochlorothiazide (HYDRODIURIL) 12.5 MG tablet Take 12.5 mg by mouth daily. 10/07/21  Yes [provider]  ipratropium-albuterol (DUONEB) 0.5-2.5 (3) MG/3ML SOLN Take 3 mLs by nebulization every 6 (six) hours as needed.   Yes [provider]  lisinopril (ZESTRIL) 40 MG tablet Take 40 mg by mouth daily. 04/03/21  Yes [provider]  montelukast (SINGULAIR) 10 MG tablet Take 10 mg by mouth at bedtime.   Yes [provider]  omeprazole (PRILOSEC) 20 MG capsule Take 20 mg by mouth 2 (two) times daily before a meal.   Yes [provider]  sucralfate (CARAFATE) 1 g tablet Take 1 g by mouth 4 (four) times daily. 03/07/21  Yes [provider]  traMADol (ULTRAM) 50 MG tablet Take 50 mg by mouth 2 (two) times daily as needed. 10/18/21  Yes [provider]  TRELEGY ELLIPTA 100-62.5-25 MCG/INH AEPB Take 1 puff by mouth daily. 03/13/21  Yes [provider]  acetaminophen-codeine (TYLENOL #3) 300-30 MG tablet Take 1 tablet by mouth every 4 (four) hours as needed. Patient not taking: Reported on 04/20/2021 04/05/21   [provider]  alendronate (FOSAMAX) 70 MG tablet Take 70 mg by mouth once a week. 10/23/21   [provider]  bisoprolol (ZEBETA) 5 MG tablet Take 0.5 tablets (2.5 mg total) by mouth at bedtime. Patient not taking: Reported on 11/14/2021 04/13/21   Loletha Grayer, MD  diltiazem (CARDIZEM CD) 180 MG 24 hr capsule Take 1 capsule (180 mg total) by mouth daily. Patient not taking: Reported on 11/14/2021 04/14/21   Loletha Grayer, MD  furosemide (LASIX) 20 MG tablet  Take 1 tablet (20 mg total) by mouth daily. Patient not taking: Reported on 11/14/2021 04/14/21   Loletha Grayer, MD  nitroGLYCERIN (NITROSTAT) 0.4 MG SL tablet Place 1 tablet (0.4 mg total) under the tongue every 5 (five) minutes as needed for chest pain. 02/23/13   Ivor Costa, MD  potassium chloride SA (KLOR-CON) 20 MEQ tablet Take 1 tablet (20 mEq total) by mouth daily. Patient not taking: Reported on 11/14/2021 04/14/21   Loletha Grayer, MD  tizanidine (ZANAFLEX) 2 MG capsule Take 2 mg by mouth 3 (three) times daily. Patient not taking: Reported on 11/14/2021    [provider]  losartan (COZAAR) 50 MG tablet Take 50 mg by mouth daily. Patient not taking: Reported on 04/11/2021  04/12/21  [provider]  metoprolol succinate (TOPROL-XL) 100 MG 24 hr tablet Take 100 mg by mouth 2 (two) times daily.  Appearance with mild to moderate distress with or immediately following a meal. Patient not taking:  Reported on 04/11/2021  04/12/21  [provider]    Physical Exam: Vitals:   11/14/21 1517 11/14/21 1518 11/14/21 1545  BP: (!) 138/57    Pulse: 69    Temp: 98.2 F (36.8 C)    TempSrc: Oral    SpO2: 94%  100%  Weight:  50.5 kg   Height:  5' (1.524 m)     Constitutional: Patient is an aged, cachectic lethargic currently in mild distress secondary to being on BiPAP. Vitals:   11/14/21 1517 11/14/21 1518 11/14/21 1545  BP: (!) 138/57    Pulse: 69    Temp: 98.2 F (36.8 C)    TempSrc: Oral    SpO2: 94%  100%  Weight:  50.5 kg   Height:  5' (1.524 m)    Eyes: PERRL, lids and conjunctivae normal ENMT: Mucous membranes are moist. Posterior pharynx clear of any exudate or lesions.Normal dentition.  Neck: normal, supple, no masses, no thyromegaly Respiratory:  Bilaterally decreased air entry, diffuse rhonchi with mild wheezing appreciated  cardiovascular: Regular rate and rhythm, no murmurs / rubs / gallops. No extremity edema. 2+ pedal pulses. No carotid bruits.   Abdomen: no tenderness, no masses palpated. No hepatosplenomegaly. Bowel sounds positive.  Musculoskeletal: no clubbing / cyanosis. No joint deformity upper and lower extremities. Good ROM, no contractures. Normal muscle tone.  Tenderness to palpation of the left hip along the trochanter line. Skin: no rashes, lesions, ulcers. No induration Neurologic: CN 2-12 grossly intact. Sensation intact, DTR normal. Strength 5/5 in all 4.  Psychiatric: Normal judgment and insight. Alert and oriented x 3. Normal mood.    Labs on Admission: I have personally reviewed following labs and imaging studies  CBC: Recent Labs  Lab 11/14/21 1545  WBC 22.2*  HGB 10.7*  HCT 34.5*  MCV 84.4  PLT 403*    Basic Metabolic Panel: Recent Labs  Lab 11/14/21 1545  NA 134*  K 3.5  CL 96*  CO2 25  GLUCOSE 156*  BUN 58*  CREATININE 1.50*  CALCIUM 9.2    GFR: Estimated Creatinine Clearance: 19.3 mL/min (A) (by C-G formula based on SCr of 1.5 mg/dL (H)).  Liver Function Tests: Recent Labs  Lab 11/14/21 1545  AST 35  ALT 17  ALKPHOS 66  BILITOT 0.6  PROT 7.4  ALBUMIN 3.6    Urine analysis:    Component Value Date/Time   COLORURINE YELLOW (A) 04/11/2021 2051   APPEARANCEUR CLEAR (A) 04/11/2021 2051   LABSPEC 1.032 (H) 04/11/2021 2051   PHURINE 7.0 04/11/2021 2051   GLUCOSEU NEGATIVE 04/11/2021 2051   HGBUR NEGATIVE 04/11/2021 2051   BILIRUBINUR NEGATIVE 04/11/2021 2051   KETONESUR 5 (A) 04/11/2021 2051   PROTEINUR 30 (A) 04/11/2021 2051   UROBILINOGEN 0.2 02/22/2013 1324   NITRITE NEGATIVE 04/11/2021 2051   LEUKOCYTESUR NEGATIVE 04/11/2021 2051    Radiological Exams on Admission: DG Chest Portable 1 View  Result Date: 11/14/2021 CLINICAL DATA:  Respiratory distress. EXAM: PORTABLE CHEST 1 VIEW COMPARISON:  Chest radiograph dated April 13, 2019 FINDINGS: The heart is enlarged. Atherosclerotic calcification of the aortic arch. Left lower lobe hazy opacity with silhouetting of the left  costophrenic angle. Bilateral mild pleural/parenchymal scarring, unchanged. IMPRESSION: 1. Stable cardiomegaly. 2. Left lower lobe hazy opacity which may represent atelectasis or effusion. Underlying airspace disease can not be excluded. Electronically Signed   By: Keane Police D.O.   On: 11/14/2021 15:59      Assessment/Plan Principal Problem:   Acute and chronic  respiratory failure with hypoxia (HCC)    #1 acute on chronic hypoxic respiratory failure secondary to COPD exacerbation/community-acquired pneumonia: -Patient is currently on BiPAP as IPAP of 10/EPAP of 5, FiO2 40% maintaining 100% oxygen saturation. - Monitor with continuous pulse oximetry and wean oxygen as tolerated.  2.  Currently acquired pneumonia/acute exacerbation of COPD: - Patient has symptomatology of cough with expectoration of sputum, leukocytosis of 22,000. - Patient initiated on ceftriaxone and azithromycin, and DuoNebs, Mucinex. - Patient was also placed on Solu-Medrol left 60 mg to q6h.- Continue home medication of Trelegy. - Urine for Legionella, Streptococcus respiratory culture has been ordered.  COVID and influenza testing has come back negative.   #3 mild elevation of creatinine: - Patient presents with a creatinine of 1.5. - Appears to be most likely prerenal as the patient appears to be dehydrated. - Patient received 500 mill IV fluid bolus and has been receiving IV fluid maintenance in the ER. - Repeat BMP in the morning. -Patient diastolic blood pressure has been on the lower side we will hold off on lisinopril and hydrochlorothiazide patient has been taking at home.  #4:  History of hypertension: - Blood pressure controlled hold lisinopril and hydrochlorothiazide and continue to monitor.  5.  Rheumatoid arthritis: Currently appears to be in remission.  6.  Left hip pain: Patient has been evaluated as an outpatient with imaging showing some crack exact details not available. Patient's husband  states that imaging is present at home and requested to bring it here so that we can assess it. - Patient currently does not have any pain as long as she is lying down.  7.  Chest pain/coronary artery disease:  -Patient reports some mild intermittent chest pain without any radiation appears to be atypical. - Check EKG, troponin. -Troponin came back as a laboratory at 161, EKG 12-lead showed PVC with heart failure pattern in V1 V2 without any acute ST-T wave changes. - Suspect this is demand ischemia, continue to trend troponins - Echocardiogram has been ordered - Cardiology will be consulted,initiated on Heparin gtt per their recommendation. - As needed nitroglycerin has been ordered. - Aspirin not initiated secondary to patient having allergy to NSAIDs.    DVT prophylaxis: Lovenox Code Status:   Full Code     Carlyle Lipa MD [facility]  How to contact the [facility] Attending or Consulting provider [place] or covering provider during after hours [place], for this patient?   Check the care team in [facility] and look for a) attending/consulting [facility] provider listed and b) the [facility] team listed Log into [email or web address] and use [place]'s universal password to access. If you do not have the password, please contact the hospital operator. Locate the [facility] provider you are looking for under [facility] and page to a number that you can be directly reached. If you still have difficulty reaching the provider, please page the [facility] (Director on Call) for the Hospitalists listed on amion for assistance.  11/14/2021, 5:24 PM

## 2021-11-14 NOTE — ED Triage Notes (Signed)
ARrives c/o worsening SOB and Chest Pain over past several days. Spouse states sats have been in the 70's all day on 4l/ [place].   ? ?AAOx3.  Pursed lip breathing.  Unable to speak in full sentences. ?

## 2021-11-14 NOTE — ED Notes (Signed)
Pt taken off bi-pap and placed on 5L [place] at this time.  ? ?

## 2021-11-14 NOTE — Consult Note (Signed)
ANTICOAGULATION CONSULT NOTE ? ?Pharmacy Consult for IV Heparin ?Indication: chest pain/ACS ? ?Patient Measurements: ?Height: 5' (152.4 cm) ?Weight: 50.5 kg (111 lb 5.3 oz) ?IBW/kg (Calculated) : 45.5 ?Heparin Dosing Weight: 50.5 kg ? ?Labs: ?Recent Labs  ?  11/14/21 ?1545 11/14/21 ?1720  ?HGB 10.7*  --   ?HCT 34.5*  --   ?PLT 403*  --   ?CREATININE 1.50*  --   ?TROPONINIHS  --  161*  ? ? ?Estimated Creatinine Clearance: 19.3 mL/min (A) (by C-G formula based on SCr of 1.5 mg/dL (H)). ? ? ?Medications:  ?No anticoagulation prior to admission per my chart review ? ?Assessment: ?Patient is an 88 y/o F with medical history including CAD, HTN, COPD on 4L O2, RA, osteoporosis, GERD who presented to the ED 3/7 with SOB and productive cough. Admitted for acute on chronic respiratory failure secondary to CAP / acute COPD exacerbation. Troponin is elevated and patient has a history of CAD. Patient is reporting some intermittent chest pian. There is concern for NSTEMI. Pharmacy consulted to initiate and manage heparin infusion for ACS. ? ?Baseline CBC notable for mild anemia which appears c/w baseline. Baseline aPTT and PT-INR are pending.  ? ?Goal of Therapy:  ?Heparin level 0.3-0.7 units/ml ?Monitor platelets by anticoagulation protocol: Yes ?  ?Plan:  ?--Heparin 3000 unit IV bolus followed by continuous infusion at 600 units/hr ?--Will check HL 8 hours after initiation of infusion ?--Daily CBC per protocol while on IV heparin ? ?Benita Gutter ?11/14/2021,7:02 PM ? ? ?

## 2021-11-14 NOTE — ED Notes (Signed)
Pt placed back on Bi-pap at this time.  ?

## 2021-11-14 NOTE — Progress Notes (Signed)
PHARMACIST - PHYSICIAN COMMUNICATION ?  ?CONCERNING: Methylprednisolone IV  ?  ?Current order: Methylprednisolone IV 60 mg q6h ?  ?  ?DESCRIPTION: ?Per [place] Protocol:  ? ?IV methylprednisolone will be converted to either a q12h or q24h frequency with the same total daily dose (TDD). ? ?Ordered Dose: 1 to 125 mg TDD; convert to: TDD q24h.  ?Ordered Dose: 126 to 250 mg TDD; convert to: TDD div q12h.  ?Ordered Dose: >250 mg TDD; DAW. ? ?Order has been adjusted to: Methylprednisolone IV 120 mg q12h ? ?Benita Gutter  ?11/14/2021 8:43 PM  ?

## 2021-11-15 ENCOUNTER — Encounter: Payer: Self-pay | Admitting: Internal Medicine

## 2021-11-15 ENCOUNTER — Inpatient Hospital Stay: Payer: Medicare HMO

## 2021-11-15 ENCOUNTER — Inpatient Hospital Stay
Admit: 2021-11-15 | Discharge: 2021-11-15 | Disposition: A | Discharge status: Home / Self Care | Payer: Medicare HMO | Attending: Internal Medicine | Admitting: Internal Medicine

## 2021-11-15 DIAGNOSIS — M25552 Pain in left hip: Secondary | ICD-10-CM

## 2021-11-15 DIAGNOSIS — J9621 Acute and chronic respiratory failure with hypoxia: Secondary | ICD-10-CM | POA: Diagnosis not present

## 2021-11-15 DIAGNOSIS — R778 Other specified abnormalities of plasma proteins: Secondary | ICD-10-CM

## 2021-11-15 DIAGNOSIS — J189 Pneumonia, unspecified organism: Secondary | ICD-10-CM

## 2021-11-15 DIAGNOSIS — D649 Anemia, unspecified: Secondary | ICD-10-CM

## 2021-11-15 DIAGNOSIS — N179 Acute kidney failure, unspecified: Secondary | ICD-10-CM

## 2021-11-15 LAB — CBC
HCT: 25.1 % — ABNORMAL LOW (ref 36.0–46.0)
Hemoglobin: 8 g/dL — ABNORMAL LOW (ref 12.0–15.0)
MCH: 26.5 pg (ref 26.0–34.0)
MCHC: 31.9 g/dL (ref 30.0–36.0)
MCV: 83.1 fL (ref 80.0–100.0)
Platelets: 302 10*3/uL (ref 150–400)
RBC: 3.02 MIL/uL — ABNORMAL LOW (ref 3.87–5.11)
RDW: 14.6 % (ref 11.5–15.5)
WBC: 14.4 10*3/uL — ABNORMAL HIGH (ref 4.0–10.5)
nRBC: 0 % (ref 0.0–0.2)

## 2021-11-15 LAB — COMPREHENSIVE METABOLIC PANEL
ALT: 13 U/L (ref 0–44)
AST: 23 U/L (ref 15–41)
Albumin: 2.8 g/dL — ABNORMAL LOW (ref 3.5–5.0)
Alkaline Phosphatase: 54 U/L (ref 38–126)
Anion gap: 8 (ref 5–15)
BUN: 45 mg/dL — ABNORMAL HIGH (ref 8–23)
CO2: 27 mmol/L (ref 22–32)
Calcium: 8.2 mg/dL — ABNORMAL LOW (ref 8.9–10.3)
Chloride: 100 mmol/L (ref 98–111)
Creatinine, Ser: 1.02 mg/dL — ABNORMAL HIGH (ref 0.44–1.00)
GFR, Estimated: 54 mL/min — ABNORMAL LOW (ref >60)
Glucose, Bld: 174 mg/dL — ABNORMAL HIGH (ref 70–99)
Potassium: 3.2 mmol/L — ABNORMAL LOW (ref 3.5–5.1)
Sodium: 135 mmol/L (ref 135–145)
Total Bilirubin: 0.3 mg/dL (ref 0.3–1.2)
Total Protein: 5.9 g/dL — ABNORMAL LOW (ref 6.5–8.1)

## 2021-11-15 LAB — RESPIRATORY PANEL BY PCR

## 2021-11-15 LAB — PROCALCITONIN: Procalcitonin: 13.74 ng/mL

## 2021-11-15 LAB — ECHOCARDIOGRAM COMPLETE
AR max vel: 2.21 cm2
AV Area VTI: 1.97 cm2
AV Area mean vel: 1.79 cm2
AV Mean grad: 4 mmHg
AV Peak grad: 7.7 mmHg
Ao pk vel: 1.39 m/s
Area-P 1/2: 4.31 cm2
Height: 60 in
S' Lateral: 3.1 cm
Weight: 1781.32 oz

## 2021-11-15 LAB — TROPONIN I (HIGH SENSITIVITY)
Troponin I (High Sensitivity): 79 ng/L — ABNORMAL HIGH (ref <18)
Troponin I (High Sensitivity): 83 ng/L — ABNORMAL HIGH (ref <18)

## 2021-11-15 LAB — BRAIN NATRIURETIC PEPTIDE: B Natriuretic Peptide: 826.2 pg/mL — ABNORMAL HIGH (ref 0.0–100.0)

## 2021-11-15 LAB — HEPARIN LEVEL (UNFRACTIONATED)
Heparin Unfractionated: >0.1 IU/mL — ABNORMAL LOW (ref 0.30–0.70)
Heparin Unfractionated: 0.15 IU/mL — ABNORMAL LOW (ref 0.30–0.70)

## 2021-11-15 LAB — STREP PNEUMONIAE URINARY ANTIGEN: Strep Pneumo Urinary Antigen: NEGATIVE

## 2021-11-15 LAB — GLUCOSE, CAPILLARY: Glucose-Capillary: 113 mg/dL — ABNORMAL HIGH (ref 70–99)

## 2021-11-15 LAB — MAGNESIUM: Magnesium: 2 mg/dL (ref 1.7–2.4)

## 2021-11-15 LAB — SEDIMENTATION RATE: Sed Rate: 73 mm/hr — ABNORMAL HIGH (ref 0–30)

## 2021-11-15 LAB — D-DIMER, QUANTITATIVE: D-Dimer, Quant: 2.03 ug/mL-FEU — ABNORMAL HIGH (ref 0.00–0.50)

## 2021-11-15 MED ORDER — INSULIN ASPART 100 UNIT/ML IJ SOLN
0.0000 [IU] | Freq: Three times a day (TID) | INTRAMUSCULAR | Status: DC
  Administered 2021-11-18 – 2021-11-20 (×3): 1 [IU] via SUBCUTANEOUS
  Filled 2021-11-15 (×3): qty 1

## 2021-11-15 MED ORDER — HYDRALAZINE HCL 20 MG/ML IJ SOLN
10.0000 mg | Freq: Four times a day (QID) | INTRAMUSCULAR | Status: DC | PRN
  Administered 2021-11-17 (×2): 10 mg via INTRAVENOUS
  Filled 2021-11-15 (×2): qty 1

## 2021-11-15 MED ORDER — IPRATROPIUM-ALBUTEROL 0.5-2.5 (3) MG/3ML IN SOLN
3.0000 mL | RESPIRATORY_TRACT | Status: DC | PRN
  Administered 2021-11-16 – 2021-11-18 (×5): 3 mL via RESPIRATORY_TRACT
  Filled 2021-11-15 (×5): qty 3

## 2021-11-15 MED ORDER — HEPARIN BOLUS VIA INFUSION
1500.0000 [IU] | Freq: Once | INTRAVENOUS | Status: AC
  Administered 2021-11-15: 1500 [IU] via INTRAVENOUS
  Filled 2021-11-15: qty 1500

## 2021-11-15 MED ORDER — IOHEXOL 300 MG/ML  SOLN
80.0000 mL | Freq: Once | INTRAMUSCULAR | Status: AC | PRN
  Administered 2021-11-15: 80 mL via INTRAVENOUS

## 2021-11-15 MED ORDER — HEPARIN SODIUM (PORCINE) 5000 UNIT/ML IJ SOLN
5000.0000 [IU] | Freq: Three times a day (TID) | INTRAMUSCULAR | Status: DC
  Administered 2021-11-16 – 2021-11-22 (×19): 5000 [IU] via SUBCUTANEOUS
  Filled 2021-11-15 (×19): qty 1

## 2021-11-15 MED ORDER — HEPARIN SODIUM (PORCINE) 5000 UNIT/ML IJ SOLN: 5000.0000 [IU] | Freq: Three times a day (TID) | INTRAMUSCULAR | Status: DC

## 2021-11-15 MED ORDER — POTASSIUM CHLORIDE 10 MEQ/100ML IV SOLN
10.0000 meq | INTRAVENOUS | Status: AC
  Administered 2021-11-15 (×4): 10 meq via INTRAVENOUS
  Filled 2021-11-15 (×3): qty 100

## 2021-11-15 MED ORDER — HEPARIN (PORCINE) 25000 UT/250ML-% IV SOLN
950.0000 [IU]/h | INTRAVENOUS | Status: DC
  Administered 2021-11-15: 950 [IU]/h via INTRAVENOUS

## 2021-11-15 NOTE — Progress Notes (Signed)
PROGRESS NOTE   LEKIA NIER  FFM:[id number]    DOB: Jun 11, 1933    DOA: 11/14/2021  PCP: [name], MD   I have briefly reviewed patients previous medical records in [facility].  Chief Complaint  Patient presents with   Shortness of Breath    Hospital Course:  88 year old female with medical history significant for CAD, HTN, COPD, chronic respiratory failure with hypoxia on 4 L/min nasal cannula oxygen, rheumatoid arthritis, osteoporosis and GERD presented to the ED with complaints of dyspnea.  She reportedly saw her MD for left hip pain and was diagnosed with a crack in the hip bone and prescribed some meds.  Following taking the meds prescribed, she started experiencing worsening dyspnea.  This was associated with productive cough without fever or chest pain.  Home DuoNebs did not offer relief.  She was directed to the ED.  Admitted for acute on chronic respiratory failure with hypoxia due to suspected COPD exacerbation, community-acquired pneumonia.  Needed BiPAP overnight of admission.  PCCM consulted.  Improving.   Assessment & Plan:  Principal Problem:   Acute and chronic respiratory failure with hypoxia (HCC) Active Problems:   COPD with acute exacerbation (HCC)   CAP (community acquired pneumonia)   Elevated troponin   AKI (acute kidney injury) ([place])   Left hip pain   Essential hypertension   Rheumatoid arthritis (HCC)   Normocytic anemia   Assessment and Plan: * Acute and chronic respiratory failure with hypoxia ([place]) Suspected due to COPD exacerbation and pneumonia. Needed BiPAP overnight of admission and was able to transition to oxygen via nasal cannula at 4 L/min this morning. Pulmonology consulted and input appreciated.  Suspect COPD exacerbation versus viral LRTI versus atypical pneumonia versus decompensated CHF. Follow-up extensive evaluation ordered by pulmonology. Supportive care with incentive spirometry. Reassess home oxygen requirement  prior to discharge.  COPD with acute exacerbation ([place]) Continue antibiotic treatment for pneumonia. Continue IV Solu-Medrol but taper down dose. PCCM input appreciated  CAP (community acquired pneumonia) Continue empirically started IV ceftriaxone and azithromycin Supportive care Follow extensive evaluation recommended by pulmonology.  Elevated troponin Chest pain CAD Demand ischemia Chronic diastolic CHF Cardiology input appreciated and suspect demand ischemia in the context of underlying acute respiratory failure and infectious etiology. Clinically euvolemic.  AKI (acute kidney injury) ([place]) Presented with creatinine of 1.5.  Resolved.  Left hip pain Patient evaluated as outpatient and told to have some chronic, exact details not available. Request outpatient records. Therapies evaluation pending review of records.  Normocytic anemia Hemoglobin has dropped from 10 to 8 g in the absence of overt bleeding. Follow CBC in a.m. and transfuse if hemoglobin 7 g or less.  Rheumatoid arthritis ([place]) Reportedly in remission.  Essential hypertension Holding lisinopril and HCTZ due to elevated creatinine.       Body mass index is 21.74 kg/m.  Nutritional Status        Pressure Ulcer:     DVT prophylaxis: SCDs Start: 11/14/21 2031     Code Status: Full Code:  Family Communication: Spouse at bedside Disposition:  Status is: Inpatient Remains inpatient appropriate because: Severity of illness, IV meds    Consultants:   Pulmonology Cardiology  Procedures:     Antimicrobials:   As above   Subjective:  Seen this morning.  Spouse at bedside.  RN at bedside had just taken her off BiPAP and placed her on oxygen via nasal cannula at 2 L/min.  At home ambulates with a cane or  a walker.  Dyspnea 25% better.  Ongoing productive cough.  Mild intermittent chest pain with coughing.  Objective:   Vitals:   11/15/21 1600 11/15/21 1630 11/15/21 1700 11/15/21 1741   BP: (!) 155/90 (!) 157/94 (!) 153/74 (!) 160/71  Pulse: 78 87 83 81  Resp: '20 18  18  '$ Temp:    98.1 F (36.7 C)  TempSrc:    Oral  SpO2: 97% 92% 93% 93%  Weight:      Height:        General exam: Elderly female, small built and frail, chronically ill looking, lying propped up in bed with mild increased work of breathing. Respiratory system: Basal crackles, left more than the right but otherwise clear to auscultation without wheezing or rhonchi.  Mild increased work of breathing.  Cardiovascular system: S1 & S2 heard, RRR. No JVD, murmurs, rubs, gallops or clicks. No pedal edema. Gastrointestinal system: Abdomen is nondistended, soft and nontender. No organomegaly or masses felt. Normal bowel sounds heard. Central nervous system: Alert and oriented. No focal neurological deficits. Extremities: Symmetric 5 x 5 power. Skin: No rashes, lesions or ulcers Psychiatry: Judgement and insight appear normal. Mood & affect appropriate.     Data Reviewed:   I have personally reviewed following labs and imaging studies   CBC: Recent Labs  Lab 11/14/21 1545 11/15/21 0418  WBC 22.2* 14.4*  HGB 10.7* 8.0*  HCT 34.5* 25.1*  MCV 84.4 83.1  PLT 403* 655    Basic Metabolic Panel: Recent Labs  Lab 11/14/21 1545 11/15/21 0418  NA 134* 135  K 3.5 3.2*  CL 96* 100  CO2 25 27  GLUCOSE 156* 174*  BUN 58* 45*  CREATININE 1.50* 1.02*  CALCIUM 9.2 8.2*  MG  --  2.0    Liver Function Tests: Recent Labs  Lab 11/14/21 1545 11/15/21 0418  AST 35 23  ALT 17 13  ALKPHOS 66 54  BILITOT 0.6 0.3  PROT 7.4 5.9*  ALBUMIN 3.6 2.8*    CBG: No results for input(s): GLUCAP in the last 168 hours.  Microbiology Studies:   Recent Results (from the past 240 hour(s))  Blood culture (routine x 2)     Status: None (Preliminary result)   Collection Time: 11/14/21  3:35 PM   Specimen: BLOOD  Result Value Ref Range Status   Specimen Description BLOOD BLRA  Final   Special Requests BOTTLES  DRAWN AEROBIC AND ANAEROBIC BCAV  Final   Culture   Final    NO GROWTH < 24 HOURS Performed at [facility], [address] [place], [place] [address]    Report Status PENDING  Incomplete  Blood culture (routine x 2)     Status: None (Preliminary result)   Collection Time: 11/14/21  3:40 PM   Specimen: BLOOD  Result Value Ref Range Status   Specimen Description BLOOD RAC  Final   Special Requests BOTTLES DRAWN AEROBIC AND ANAEROBIC BCAV  Final   Culture   Final    NO GROWTH < 24 HOURS Performed at [facility], [address] [place], [place] [address]    Report Status PENDING  Incomplete  Resp Panel by RT-PCR (Flu A&B, Covid) Nasopharyngeal Swab     Status: None   Collection Time: 11/14/21  3:45 PM   Specimen: Nasopharyngeal Swab; Nasopharyngeal(NP) swabs in vial transport medium  Result Value Ref Range Status   SARS Coronavirus 2 by RT PCR NEGATIVE NEGATIVE Final    Comment: (NOTE) SARS-CoV-2 target  nucleic acids are NOT DETECTED.  The SARS-CoV-2 RNA is generally detectable in upper respiratory specimens during the acute phase of infection. The lowest concentration of SARS-CoV-2 viral copies this assay can detect is 138 copies/mL. A negative result does not preclude SARS-Cov-2 infection and should not be used as the sole basis for treatment or other patient management decisions. A negative result may occur with  improper specimen collection/handling, submission of specimen other than nasopharyngeal swab, presence of viral mutation(s) within the areas targeted by this assay, and inadequate number of viral copies(<138 copies/mL). A negative result must be combined with clinical observations, patient history, and epidemiological information. The expected result is Negative.  Fact Sheet for Patients:  EntrepreneurPulse.com.au  Fact Sheet for Healthcare Providers:  IncredibleEmployment.be  This test is no t yet approved  or cleared by the [place] FDA and  has been authorized for detection and/or diagnosis of SARS-CoV-2 by FDA under an Emergency Use Authorization (EUA). This EUA will remain  in effect (meaning this test can be used) for the duration of the COVID-19 declaration under Section 564(b)(1) of the Act, 21 U.S.C.section 360bbb-3(b)(1), unless the authorization is terminated  or revoked sooner.       Influenza A by PCR NEGATIVE NEGATIVE Final   Influenza B by PCR NEGATIVE NEGATIVE Final    Comment: (NOTE) The Xpert Xpress SARS-CoV-2/FLU/RSV plus assay is intended as an aid in the diagnosis of influenza from Nasopharyngeal swab specimens and should not be used as a sole basis for treatment. Nasal washings and aspirates are unacceptable for Xpert Xpress SARS-CoV-2/FLU/RSV testing.  Fact Sheet for Patients: EntrepreneurPulse.com.au  Fact Sheet for Healthcare Providers: IncredibleEmployment.be  This test is not yet approved or cleared by the [place] FDA and has been authorized for detection and/or diagnosis of SARS-CoV-2 by FDA under an Emergency Use Authorization (EUA). This EUA will remain in effect (meaning this test can be used) for the duration of the COVID-19 declaration under Section 564(b)(1) of the Act, 21 U.S.C. section 360bbb-3(b)(1), unless the authorization is terminated or revoked.  Performed at [facility], [address], [place], [place] [address]     Radiology Studies:  DG Chest Portable 1 View  Result Date: 11/14/2021 CLINICAL DATA:  Respiratory distress. EXAM: PORTABLE CHEST 1 VIEW COMPARISON:  Chest radiograph dated April 13, 2019 FINDINGS: The heart is enlarged. Atherosclerotic calcification of the aortic arch. Left lower lobe hazy opacity with silhouetting of the left costophrenic angle. Bilateral mild pleural/parenchymal scarring, unchanged. IMPRESSION: 1. Stable cardiomegaly. 2. Left lower lobe hazy opacity  which may represent atelectasis or effusion. Underlying airspace disease can not be excluded. Electronically Signed   By: Keane Police D.O.   On: 11/14/2021 15:59   ECHOCARDIOGRAM COMPLETE  Result Date: 11/15/2021    ECHOCARDIOGRAM REPORT   Patient Name:   [name] Date of Exam: 11/15/2021 Medical Rec #:  [id number]        Height:       60.0 in Accession #:    [id number]       Weight:       111.3 lb Date of Birth:  October 10, 1932         BSA:          1.455 m Patient Age:    73 years         BP:           161/80 mmHg Patient Gender: F  HR:           93 bpm. Exam Location:  [facility] Procedure: 2D Echo, Cardiac Doppler and Color Doppler Indications:     Chest pain R07.9  History:         Patient has prior history of Echocardiogram examinations, most                  recent 02/23/2021. CHF, Previous Myocardial Infarction, COPD;                  Risk Factors:Hypertension. SVT.  Sonographer:     Sherrie Sport Referring Phys:  [id number] [facility] [place] Diagnosing Phys: Donnelly Angelica  Sonographer Comments: Technically challenging study due to limited acoustic windows. Image acquisition challenging due to breast implants and Image acquisition challenging due to COPD. IMPRESSIONS  1. Left ventricular ejection fraction, by estimation, is 55 to 60%. The left ventricle has normal function. The left ventricle has no regional wall motion abnormalities. Left ventricular diastolic parameters are consistent with Grade I diastolic dysfunction (impaired relaxation).  2. Right ventricular systolic function is normal. The right ventricular size is not well visualized.  3. Left atrial size was moderately dilated.  4. The mitral valve is degenerative. Mild mitral valve regurgitation. No evidence of mitral stenosis. The mean mitral valve gradient is 5.0 mmHg.  5. The aortic valve is calcified. Aortic valve regurgitation is trivial. Aortic valve sclerosis/calcification is present, without any evidence of aortic stenosis. FINDINGS   Left Ventricle: Left ventricular ejection fraction, by estimation, is 55 to 60%. The left ventricle has normal function. The left ventricle has no regional wall motion abnormalities. The left ventricular internal cavity size was normal in size. There is  no left ventricular hypertrophy. Left ventricular diastolic parameters are consistent with Grade I diastolic dysfunction (impaired relaxation). Right Ventricle: The right ventricular size is not well visualized. Right vetricular wall thickness was not well visualized. Right ventricular systolic function is normal. Left Atrium: Left atrial size was moderately dilated. Right Atrium: Right atrial size was not well visualized. Pericardium: There is no evidence of pericardial effusion. Mitral Valve: The mitral valve is degenerative in appearance. Mild mitral valve regurgitation. No evidence of mitral valve stenosis. MV peak gradient, 9.6 mmHg. The mean mitral valve gradient is 5.0 mmHg. Tricuspid Valve: The tricuspid valve is normal in structure. Tricuspid valve regurgitation is trivial. Aortic Valve: The aortic valve is calcified. Aortic valve regurgitation is trivial. Aortic valve sclerosis/calcification is present, without any evidence of aortic stenosis. Aortic valve mean gradient measures 4.0 mmHg. Aortic valve peak gradient measures 7.7 mmHg. Aortic valve area, by VTI measures 1.97 cm. Pulmonic Valve: The pulmonic valve was not well visualized. Pulmonic valve regurgitation is not visualized. Aorta: The aortic root is normal in size and structure. IAS/Shunts: The interatrial septum was not well visualized.  LEFT VENTRICLE PLAX 2D LVIDd:         4.30 cm   Diastology LVIDs:         3.10 cm   LV e' medial:    4.03 cm/s LV PW:         1.10 cm   LV E/e' medial:  2.0 LV IVS:        1.10 cm   LV e' lateral:   7.34 cm/s LVOT diam:     2.00 cm   LV E/e' lateral: 1.1 LV SV:         51 LV SV Index:   35 LVOT Area:  3.14 cm  RIGHT VENTRICLE RV S prime:     11.20 cm/s TAPSE  (M-mode): 2.0 cm LEFT ATRIUM             Index        RIGHT ATRIUM           Index LA diam:        3.60 cm 2.47 cm/m   RA Area:     14.90 cm LA Vol (A2C):   72.1 ml 49.55 ml/m  RA Volume:   30.50 ml  20.96 ml/m LA Vol (A4C):   49.3 ml 33.88 ml/m LA Biplane Vol: 59.7 ml 41.02 ml/m  AORTIC VALVE                    PULMONIC VALVE AV Area (Vmax):    2.21 cm     PV Vmax:        0.56 m/s AV Area (Vmean):   1.79 cm     PV Vmean:       38.300 cm/s AV Area (VTI):     1.97 cm     PV VTI:         0.114 m AV Vmax:           139.00 cm/s  PV Peak grad:   1.3 mmHg AV Vmean:          94.400 cm/s  PV Mean grad:   1.0 mmHg AV VTI:            0.257 m      RVOT Peak grad: 6 mmHg AV Peak Grad:      7.7 mmHg AV Mean Grad:      4.0 mmHg LVOT Vmax:         97.90 cm/s LVOT Vmean:        53.700 cm/s LVOT VTI:          0.161 m LVOT/AV VTI ratio: 0.63  AORTA Ao Root diam: 2.90 cm MITRAL VALVE                TRICUSPID VALVE MV Area (PHT): 4.31 cm     TR Peak grad:   10.8 mmHg MV Peak grad:  9.6 mmHg     TR Vmax:        164.00 cm/s MV Mean grad:  5.0 mmHg MV Vmax:       1.55 m/s     SHUNTS MV Vmean:      103.0 cm/s   Systemic VTI:  0.16 m MV Decel Time: 176 msec     Systemic Diam: 2.00 cm MV E velocity: 8.05 cm/s    Pulmonic VTI:  0.252 m MV A velocity: 133.00 cm/s MV E/A ratio:  0.06 Donnelly Angelica Electronically signed by Donnelly Angelica Signature Date/Time: 11/15/2021/5:13:23 PM    Final     Scheduled Meds:    citalopram  20 mg Oral Daily   fluticasone furoate-vilanterol  1 puff Inhalation Daily   And   umeclidinium bromide  1 puff Inhalation Daily   ipratropium-albuterol  3 mL Nebulization Q4H   methylPREDNISolone (SOLU-MEDROL) injection  120 mg Intravenous Q12H   montelukast  10 mg Oral QHS   pantoprazole  40 mg Oral Daily   sucralfate  1 g Oral QID    Continuous Infusions:    azithromycin     cefTRIAXone (ROCEPHIN)  IV 1 g (11/15/21 1822)   heparin 950 Units/hr (11/15/21 1816)     LOS: 1 day  Vernell Leep, MD,   FACP, [facility], [facility], [facility] (Care Management Physician Certified) [facility] [place]  To contact the attending provider between 7A-7P or the covering provider during after hours 7P-7A, please log into the web site [email or web address] and access using universal [place] password for that web site. If you do not have the password, please call the hospital operator.  11/15/2021, 7:08 PM

## 2021-11-15 NOTE — Assessment & Plan Note (Addendum)
CTA chest 3/8 confirmed new left lower lobe infiltrate with associated small effusion. Completed 7-day course of antibiotics including azithromycin and IV ceftriaxone.

## 2021-11-15 NOTE — Assessment & Plan Note (Addendum)
Reportedly in remission. ?Rheumatoid factor elevated, unclear significance.  Also has elevated ESR CRP. ?

## 2021-11-15 NOTE — Assessment & Plan Note (Addendum)
Suspected due to COPD exacerbation, possibly advanced and pneumonia. Needed BiPAP overnight of admission Pulmonology following RVP panel negative.  ESR 73 and CRP 27.7 significantly elevated. Rheumatoid factor 91.8?  Significance in the context of known RA.  Serum Fungitell is pending BNP 826 but down from 1878, 7 months prior.  Clinically does not look volume overloaded but given ongoing significant DOE and hypoxia, given a dose of IV Lasix 40 mg x 1 with improvement. Procalcitonin 27.29 > 13.73, already on antibiotics. Supportive care with incentive spirometry. Reassess home oxygen requirement prior to discharge. Due to elevated D-dimer and concern for PE, obtained CTA chest, negative for PE. As per pulmonology, on nebulized Mucomyst 3 times daily followed by MetaNeb with saline to address left base consolidation.  Repeat chest x-ray without significant change Slowly improving in the last 48 hours, reports DOE better, remains on 5 L/min oxygen via [place] and saturating in the upper 90s.  Bringing out more phlegm.

## 2021-11-15 NOTE — Consult Note (Signed)
ANTICOAGULATION CONSULT NOTE ? ?Pharmacy Consult for IV Heparin ?Indication: chest pain/ACS ? ?Patient Measurements: ?Height: 5' (152.4 cm) ?Weight: 50.5 kg (111 lb 5.3 oz) ?IBW/kg (Calculated) : 45.5 ?Heparin Dosing Weight: 50.5 kg ? ?Labs: ?Recent Labs  ?  11/14/21 ?1545 11/14/21 ?1720 11/14/21 ?1930 11/14/21 ?2143 11/15/21 ?0418  ?HGB 10.7*  --   --   --  8.0*  ?HCT 34.5*  --   --   --  25.1*  ?PLT 403*  --   --   --  302  ?APTT  --   --  48*  --   --   ?LABPROT  --   --  15.3*  --   --   ?INR  --   --  1.2  --   --   ?HEPARINUNFRC  --   --   --   --  >0.10*  ?CREATININE 1.50*  --   --   --  1.02*  ?TROPONINIHS  --  161* 175* 156*  --   ? ? ? ?Estimated Creatinine Clearance: 28.4 mL/min (A) (by C-G formula based on SCr of 1.02 mg/dL (H)). ? ? ?Medications:  ?No anticoagulation prior to admission per my chart review ? ?Assessment: ?Patient is an 88 y/o F with medical history including CAD, HTN, COPD on 4L O2, RA, osteoporosis, GERD who presented to the ED 3/7 with SOB and productive cough. Admitted for acute on chronic respiratory failure secondary to CAP / acute COPD exacerbation. Troponin is elevated and patient has a history of CAD. Patient is reporting some intermittent chest pian. There is concern for NSTEMI. Pharmacy consulted to initiate and manage heparin infusion for ACS. ? ?Baseline CBC notable for mild anemia which appears c/w baseline. Baseline aPTT and PT-INR are pending.  ? ?Goal of Therapy:  ?Heparin level 0.3-0.7 units/ml ?Monitor platelets by anticoagulation protocol: Yes ? ?3/8 0418 HL > 0.1, subtherapeutic ?  ?Plan:  ?--Heparin 1500 unit IV bolus x 1 ?--Increase continuous infusion to 800 units/hr ?--Recheck HL 8 hours after rate change ?--Daily CBC per protocol while on IV heparin ? ?Renda Rolls, PharmD, MBA ?11/15/2021 ?6:19 AM ? ? ? ?

## 2021-11-15 NOTE — Consult Note (Signed)
PULMONOLOGY         Date: 11/15/2021,   MRN# [id number] [name] [birth year]     AdmissionWeight: 50.5 kg                 CurrentWeight: 50.5 kg   Referring physician: Dr Algis Liming   CHIEF COMPLAINT:   Acute on chronic hypoxemic respiratory failure   HISTORY OF PRESENT ILLNESS   This is an 88 yo F with hx of chronic lung disease with Asthma COPD overlap syndrome (ACOS) and chronic hypoxemia on 4L/min [place] as well as RA , GERD, HTN. She does have husband at bedside and he is able to help with details of history.   Patient reports that home RN was able to evaluate her and noted that she was in distress with labored breathing and asked for patient to be seen in ER.   On admission to ER she required BIPAP 10/5 40% FioO2.   She had chest imaging done via CXR with no grossly appreciable infiltrate with chronic opacification due to breat implants.  CT angio done Aug 2022 reviewed independently which does show chronic bronchitic changes with crowding of right basal bronchial segments.  No fibrotic changes to suggest RA-ILD.   Blood work with AKI and hypoalbuminemia with hypokalemia.    She received IV fluids and after this the CBC did show dilutional effects with anemia and other cell lines reduced in parallel which is expected. Patient denies having hemoptysis but did report loose stools unsure if there is blood in it.   She does have leukocytosis on arrival. She was placed on heparin due to notable troponin elevation and had cardiology evaluation while in ER.     PAST MEDICAL HISTORY   Past Medical History:  Diagnosis Date   Anxiety    Asthma    CHF (congestive heart failure) (HCC)    COPD (chronic obstructive pulmonary disease) ([place])    Hypertension    Melanoma ([place])    scalp (2007) & leg (2013)   Myocardial infarction ([place]) 09/10/1997   no PCI or CABG   Osteoporosis    Rheumatoid arthritis (HCC)    SVT (supraventricular tachycardia) ([place])       SURGICAL HISTORY   Past Surgical History:  Procedure Laterality Date   ABDOMINAL HYSTERECTOMY     BREAST SURGERY     CARDIAC SURGERY     CARPAL TUNNEL RELEASE     MELANOMA EXCISION  2007   scalp   MELANOMA EXCISION Right 2013   lower leg   SHOULDER SURGERY     TOTAL HIP REVISION  2015   VIDEO BRONCHOSCOPY Bilateral 04/08/2014   Procedure: VIDEO BRONCHOSCOPY WITHOUT FLUORO;  Surgeon: Juanito Doom, MD;  Location[facility] ENDOSCOPY;  Service: Cardiopulmonary;  Laterality: Bilateral;     FAMILY HISTORY   No family history on file.   SOCIAL HISTORY   Social History   Tobacco Use   Smoking status: Former    Packs/day: 1.50    Years: 22.00    Pack years: 33.00    Types: Cigarettes    Quit date: 04/07/1974    Years since quitting: 47.6   Smokeless tobacco: Never  Substance Use Topics   Alcohol use: No   Drug use: No     MEDICATIONS    Home Medication:  Current Outpatient Rx   Order #: [id number] Class: Historical Med   Order #: [id number] Class: Historical Med   Order #: [id number] Class: Historical Med   Order #:  [id number] Class: Historical Med   Order #: [id number] Class: Historical Med   Order #: [id number] Class: Historical Med   Order #: [id number] Class: Historical Med   Order #: [id number] Class: Historical Med   Order #: [id number] Class: Historical Med   Order #: [id number] Class: Historical Med   Order #: [id number] Class: Historical Med   Order #: [id number] Class: Historical Med   Order #: [id number] Class: Historical Med   Order #: [id number] Class: Historical Med   Order #: [id number] Class: Historical Med   Order #: [id number] Class: Normal   Order #: [id number] Class: Normal   Order #: [id number] Class: Normal   Order #: [id number] Class: Print   Order #: [id number] Class: Normal   Order #: [id number] Class: Historical Med    Current Medication:  Current Facility-Administered Medications:    acetaminophen (TYLENOL) tablet 650 mg, 650 mg, Oral, Q6H PRN, Pudota, Kathe Becton,  MD   azithromycin (ZITHROMAX) 500 mg in sodium chloride 0.9 % 250 mL IVPB, 500 mg, Intravenous, Q24H, Chappell, Alex B, RPH   cefTRIAXone (ROCEPHIN) 1 g in sodium chloride 0.9 % 100 mL IVPB, 1 g, Intravenous, Q24H, Chappell, Alex B, RPH   citalopram (CELEXA) tablet 20 mg, 20 mg, Oral, Daily, Benita Gutter, RPH, 20 mg at 11/15/21 0927   fluticasone furoate-vilanterol (BREO ELLIPTA) 100-25 MCG/ACT 1 puff, 1 puff, Inhalation, Daily **AND** umeclidinium bromide (INCRUSE ELLIPTA) 62.5 MCG/ACT 1 puff, 1 puff, Inhalation, Daily, Pudota, Kingsley P, MD   heparin ADULT infusion 100 units/mL (25000 units/219m), 800 Units/hr, Intravenous, Continuous, Belue, NAlver Sorrow RPH, Last Rate: 8 mL/hr at 11/15/21 0639, 800 Units/hr at 11/15/21 0639   ipratropium-albuterol (DUONEB) 0.5-2.5 (3) MG/3ML nebulizer solution 3 mL, 3 mL, Nebulization, Q4H, Pudota, Kingsley P, MD, 3 mL at 11/15/21 0717   lactated ringers infusion, , Intravenous, Continuous, Hongalgi, Anand D, MD, Last Rate: 10 mL/hr at 11/15/21 0850, Rate Change at 11/15/21 0850   methylPREDNISolone sodium succinate (SOLU-MEDROL) 125 mg/2 mL injection 120 mg, 120 mg, Intravenous, Q12H, CBenita Gutter RPH, 120 mg at 11/15/21 0522   montelukast (SINGULAIR) tablet 10 mg, 10 mg, Oral, QHS, Pudota, Kingsley P, MD, 10 mg at 11/14/21 2132   nitroGLYCERIN (NITROSTAT) SL tablet 0.4 mg, 0.4 mg, Sublingual, Q5 min PRN, Pudota, KKathe Becton MD   pantoprazole (PROTONIX) EC tablet 40 mg, 40 mg, Oral, Daily, CBenita Gutter RPH, 40 mg at 11/15/21 00174  potassium chloride 10 mEq in 100 mL IVPB, 10 mEq, Intravenous, Q1 Hr x 4, Hongalgi, Anand D, MD, Last Rate: 100 mL/hr at 11/15/21 0931, 10 mEq at 11/15/21 0931   sucralfate (CARAFATE) tablet 1 g, 1 g, Oral, QID, Pudota, KKathe Becton MD, 1 g at 11/15/21 09449 Current Outpatient Medications:    acetaminophen (TYLENOL) 325 MG tablet, Take 650 mg by mouth every 6 (six) hours as needed., Disp: , Rfl:    albuterol (PROVENTIL)  (2.5 MG/3ML) 0.083% nebulizer solution, Inhale 3 mLs into the lungs every 6 (six) hours as needed., Disp: , Rfl:    Cholecalciferol (VITAMIN D PO), Take 25 mcg by mouth daily., Disp: , Rfl:    citalopram (CELEXA) 20 MG tablet, Take 20 mg by mouth daily., Disp: , Rfl:    fish oil-omega-3 fatty acids 1000 MG capsule, Take 2 g by mouth daily., Disp: , Rfl:    hydrochlorothiazide (HYDRODIURIL) 12.5 MG tablet, Take 12.5 mg by mouth daily., Disp: , Rfl:    ipratropium-albuterol (DUONEB) 0.5-2.5 (3) MG/3ML SOLN, Take 3 mLs by nebulization every 6 (six) hours as needed., Disp: , Rfl:  lisinopril (ZESTRIL) 40 MG tablet, Take 40 mg by mouth daily., Disp: , Rfl:    montelukast (SINGULAIR) 10 MG tablet, Take 10 mg by mouth at bedtime., Disp: , Rfl:    omeprazole (PRILOSEC) 20 MG capsule, Take 20 mg by mouth 2 (two) times daily before a meal., Disp: , Rfl:    sucralfate (CARAFATE) 1 g tablet, Take 1 g by mouth 4 (four) times daily., Disp: , Rfl:    traMADol (ULTRAM) 50 MG tablet, Take 50 mg by mouth 2 (two) times daily as needed., Disp: , Rfl:    TRELEGY ELLIPTA 100-62.5-25 MCG/INH AEPB, Take 1 puff by mouth daily., Disp: , Rfl:    acetaminophen-codeine (TYLENOL #3) 300-30 MG tablet, Take 1 tablet by mouth every 4 (four) hours as needed. (Patient not taking: Reported on 04/20/2021), Disp: , Rfl:    alendronate (FOSAMAX) 70 MG tablet, Take 70 mg by mouth once a week., Disp: , Rfl:    bisoprolol (ZEBETA) 5 MG tablet, Take 0.5 tablets (2.5 mg total) by mouth at bedtime. (Patient not taking: Reported on 11/14/2021), Disp: 30 tablet, Rfl: 0   diltiazem (CARDIZEM CD) 180 MG 24 hr capsule, Take 1 capsule (180 mg total) by mouth daily. (Patient not taking: Reported on 11/14/2021), Disp: 30 capsule, Rfl: 0   furosemide (LASIX) 20 MG tablet, Take 1 tablet (20 mg total) by mouth daily. (Patient not taking: Reported on 11/14/2021), Disp: 30 tablet, Rfl: 0   nitroGLYCERIN (NITROSTAT) 0.4 MG SL tablet, Place 1 tablet (0.4 mg  total) under the tongue every 5 (five) minutes as needed for chest pain., Disp: 30 tablet, Rfl: 3   potassium chloride SA (KLOR-CON) 20 MEQ tablet, Take 1 tablet (20 mEq total) by mouth daily. (Patient not taking: Reported on 11/14/2021), Disp: 30 tablet, Rfl: 0   tizanidine (ZANAFLEX) 2 MG capsule, Take 2 mg by mouth 3 (three) times daily. (Patient not taking: Reported on 11/14/2021), Disp: , Rfl:     ALLERGIES   Celebrex [celecoxib] and Morphine and related     REVIEW OF SYSTEMS    Review of Systems:  Gen:  Denies  fever, sweats, chills weigh loss  HEENT: Denies blurred vision, double vision, ear pain, eye pain, hearing loss, nose bleeds, sore throat Cardiac:  No dizziness, chest pain or heaviness, chest tightness,edema Resp:   Denies cough or sputum porduction, shortness of breath,wheezing, hemoptysis,  Gi: Denies swallowing difficulty, stomach pain, nausea or vomiting, diarrhea, constipation, bowel incontinence Gu:  Denies bladder incontinence, burning urine Ext:   Denies Joint pain, stiffness or swelling Skin: Denies  skin rash, easy bruising or bleeding or hives Endoc:  Denies polyuria, polydipsia , polyphagia or weight change Psych:   Denies depression, insomnia or hallucinations   Other:  All other systems negative   VS: BP (!) 156/82    Pulse (!) 175    Temp 98.3 F (36.8 C) (Oral)    Resp (!) 24    Ht 5' (1.524 m)    Wt 50.5 kg    SpO2 93%    BMI 21.74 kg/m      PHYSICAL EXAM    GENERAL:NAD, no fevers, chills, no weakness no fatigue HEAD: Normocephalic, atraumatic.  EYES: Pupils equal, round, reactive to light. Extraocular muscles intact. No scleral icterus.  MOUTH: Moist mucosal membrane. Dentition intact. No abscess noted.  EAR, NOSE, THROAT: Clear without exudates. No external lesions.  NECK: Supple. No thyromegaly. No nodules. No JVD.  PULMONARY:reduced air entry bilaterally  CARDIOVASCULAR: S1 and  S2. Regular rate and rhythm. No murmurs, rubs, or gallops. No  edema. Pedal pulses 2+ bilaterally.  GASTROINTESTINAL: Soft, nontender, nondistended. No masses. Positive bowel sounds. No hepatosplenomegaly.  MUSCULOSKELETAL: No swelling, clubbing, or edema. Range of motion full in all extremities.  NEUROLOGIC: Cranial nerves II through XII are intact. No gross focal neurological deficits. Sensation intact. Reflexes intact.  SKIN: No ulceration, lesions, rashes, or cyanosis. Skin warm and dry. Turgor intact.  PSYCHIATRIC: Mood, affect within normal limits. The patient is awake, alert and oriented x 3. Insight, judgment intact.       IMAGING    DG Chest Portable 1 View  Result Date: 11/14/2021 CLINICAL DATA:  Respiratory distress. EXAM: PORTABLE CHEST 1 VIEW COMPARISON:  Chest radiograph dated April 13, 2019 FINDINGS: The heart is enlarged. Atherosclerotic calcification of the aortic arch. Left lower lobe hazy opacity with silhouetting of the left costophrenic angle. Bilateral mild pleural/parenchymal scarring, unchanged. IMPRESSION: 1. Stable cardiomegaly. 2. Left lower lobe hazy opacity which may represent atelectasis or effusion. Underlying airspace disease can not be excluded. Electronically Signed   By: Keane Police D.O.   On: 11/14/2021 15:59      ASSESSMENT/PLAN   Acute on chronic hypoxemic respiratory failure -etiology differential includes acute COPD exacerbation vs viral LRTI vs atypical pneumonia , vs acute blood loss anemia vs acute on chronic diastolic CHF - present on admission  - COVID19 negative   - supplemental O2 during my evaluation 5L/min  - nfectious workup for pneumonia as below -Respiratory viral panel -serum fungitell -legionella ab -strep pneumoniae ur AG -Histoplasma Ur Ag -sputum resp cultures -reviewed pertinent imaging with patient today - ESR/CRP  -PT/OT for d/c planning  -please encourage patient to use incentive spirometer few times each hour while hospitalized.    Anemia    Trending down H/h     - fecal occult  blood stool - patient reports loose stool      -currently on heparin - consider DC if not needed as cardiology thinks its unlikely ACS   Acute on chronic diastolic CHF    In context of AE COPD vs LRTI   -BNP    - cardiology consult - appreciate input - Dr Corky Sox and Orinda Kenner     - s/p TTE - reviewed no severe findings   Acute exacerbation of COPD     -Wean down IV solumedrol     -Continue rocephin zithromax for now     -PRN duoneb   - Incruse ellipta   - Breo once daily    - IS and Flutter several times daily    Severe GERD   Carafate  -    Thank you for allowing me to participate in the care of this patient.  Patient has at least 1 severe active medical problem which is life threatening and is being managed during this evaluation  Patient/Family are satisfied with care plan and all questions have been answered.  This document was prepared using Dragon voice recognition software and may include unintentional dictation errors.     Ottie Glazier, M.D.  Division of [place]

## 2021-11-15 NOTE — Progress Notes (Signed)
*  PRELIMINARY RESULTS* ?Echocardiogram ?2D Echocardiogram has been performed. ? ?[name], [name] ?11/15/2021, 2:39 PM ?

## 2021-11-15 NOTE — ED Notes (Signed)
Pt assisted on bedpan.

## 2021-11-15 NOTE — Consult Note (Signed)
ANTICOAGULATION CONSULT NOTE ? ?Pharmacy Consult for IV Heparin ?Indication: chest pain/ACS ? ?Patient Measurements: ?Height: 5' (152.4 cm) ?Weight: 50.5 kg (111 lb 5.3 oz) ?IBW/kg (Calculated) : 45.5 ?Heparin Dosing Weight: 50.5 kg ? ?Labs: ?Recent Labs  ?  11/14/21 ?1545 11/14/21 ?1720 11/14/21 ?1930 11/14/21 ?2143 11/15/21 ?1610 11/15/21 ?0932 11/15/21 ?1515  ?HGB 10.7*  --   --   --  8.0*  --   --   ?HCT 34.5*  --   --   --  25.1*  --   --   ?PLT 403*  --   --   --  302  --   --   ?APTT  --   --  48*  --   --   --   --   ?LABPROT  --   --  15.3*  --   --   --   --   ?INR  --   --  1.2  --   --   --   --   ?HEPARINUNFRC  --   --   --   --  >0.10*  --  0.15*  ?CREATININE 1.50*  --   --   --  1.02*  --   --   ?TROPONINIHS  --    < > 175* 156*  --  79* 83*  ? < > = values in this interval not displayed.  ? ? ? ?Estimated Creatinine Clearance: 28.4 mL/min (A) (by C-G formula based on SCr of 1.02 mg/dL (H)). ? ? ?Medications:  ?No anticoagulation prior to admission per my chart review ? ?Assessment: ?Patient is an 88 y/o F with medical history including CAD, HTN, COPD on 4L O2, RA, osteoporosis, GERD who presented to the ED 3/7 with SOB and productive cough. Admitted for acute on chronic respiratory failure secondary to CAP / acute COPD exacerbation. Troponin is elevated and patient has a history of CAD. Patient is reporting some intermittent chest pian. There is concern for NSTEMI. Pharmacy consulted to initiate and manage heparin infusion for ACS. ? ?Baseline CBC notable for mild anemia which appears c/w baseline. Baseline aPTT and PT-INR are pending.  ? ?Goal of Therapy:  ?Heparin level 0.3-0.7 units/ml ?Monitor platelets by anticoagulation protocol: Yes ? ?3/8 0418 HL > 0.1, subtherapeutic ?3/8  1515 HL=0.15     subther, bolus and incre drip from 800 u/hr to 950 u/hr ?  ?Plan:  ?3/8  1515 HL=0.15     subtherapeutic ?--Heparin 1500 unit IV bolus x 1 ?--Increase continuous infusion to 950 units/hr ?--Recheck HL 8  hours after rate change ?--Daily CBC per protocol while on IV heparin ? ?Chinita Greenland PharmD ?Clinical Pharmacist ?11/15/2021 ? ? ? ? ?

## 2021-11-15 NOTE — Plan of Care (Signed)
?  Problem: Education: ?Goal: Knowledge of General Education information will improve ?Description: Including pain rating scale, medication(s)/side effects and non-pharmacologic comfort measures ?11/15/2021 1855 by Emmaline Life, RN ?Outcome: Progressing ?11/15/2021 1855 by Emmaline Life, RN ?Outcome: Progressing ?  ?

## 2021-11-15 NOTE — Consult Note (Signed)
[facility] NOTE       Patient ID: [name] MRN: [id number] DOB/AGE: 1932-12-28 88 y.o.  Admit date: 11/14/2021 Referring Physician Dr. Algis Liming Primary Physician Lloyd Huger  Primary Cardiologist in [place] (last seen 1 year ago)  Reason for Consultation atypical chest pain   HPI: The patient is an 88 year old female with a past medical history notable for HFpEF (LVEF 55-60% with myxomatous MV, mild AR with g3DD), history of MI 1999, COPD on 4 L at baseline, hypertension, rheumatoid arthritis who presented to [facility] ED with shortness of breath and productive cough for 2 to 3 days.  She presented hypoxic to the 70s and severely dyspneic requiring BiPAP initially.  Cardiology was consulted because of her elevated troponin and chest pain.  The patient presents with her husband who assists with history during the encounter.  The patient says she started "feeling bad" and with a productive cough noticed chest pain on Sunday.  She says her pain is in the center of her chest and associated with some sensation of feeling hot, but is nonradiating, not pleuritic, is not reproducible, and she cannot identify anything that makes it better or worse.  She said the pain has been happening intermittently since Sunday, and most recently occurred this morning in the ED.  She admits to chronic shortness of breath that has been worsening as well over the past 3 days.  Denies palpitations, presyncope, orthopnea, lower extremity edema.    She says she has not smoked in 30 years and does not drink alcohol.  She wears 4 L of oxygen chronically at home.  She says she has not really noticed a change in how she feels since she has been in the ED.  However she is notably off BiPAP and back on 4.5L by [place].  She moved from [place] to [place] 1 year ago and has not yet established care with a cardiologist here.  She does not remember the last time she had a heart catheter if she has ever  had 1 but states she had a heart attack in 1999.  Labs on admission are notable for potassium 3.2, creatinine 1.02, EGFR 54.  Magnesium 2.  Troponin flat [id number].  Procalcitonin elevated at 27.2.  Leukocytosis to 22-14, H&H 10-8/25 D-dimer 2.03.  Chest x-ray with stable cardiomegaly and left lower lobe hazy opacity that may represent atelectasis or effusion.  Review of systems complete and found to be negative unless listed above     Past Medical History:  Diagnosis Date   Anxiety    Asthma    CHF (congestive heart failure) (HCC)    COPD (chronic obstructive pulmonary disease) ([place])    Hypertension    Melanoma ([place])    scalp (2007) & leg (2013)   Myocardial infarction ([place]) 09/10/1997   no PCI or CABG   Osteoporosis    Rheumatoid arthritis (HCC)    SVT (supraventricular tachycardia) ([place])     Past Surgical History:  Procedure Laterality Date   ABDOMINAL HYSTERECTOMY     BREAST SURGERY     CARDIAC SURGERY     CARPAL TUNNEL RELEASE     MELANOMA EXCISION  2007   scalp   MELANOMA EXCISION Right 2013   lower leg   SHOULDER SURGERY     TOTAL HIP REVISION  2015   VIDEO BRONCHOSCOPY Bilateral 04/08/2014   Procedure: VIDEO BRONCHOSCOPY WITHOUT FLUORO;  Surgeon: Juanito Doom, MD;  Location: [facility] ENDOSCOPY;  Service: Cardiopulmonary;  Laterality:  Bilateral;    (Not in a hospital admission)  Social History   Socioeconomic History   Marital status: Married    Spouse name: Not on file   Number of children: Not on file   Years of education: Not on file   Highest education level: Not on file  Occupational History   Not on file  Tobacco Use   Smoking status: Former    Packs/day: 1.50    Years: 22.00    Pack years: 33.00    Types: Cigarettes    Quit date: 04/07/1974    Years since quitting: 47.6   Smokeless tobacco: Never  Substance and Sexual Activity   Alcohol use: No   Drug use: No   Sexual activity: Never  Other Topics Concern   Not on file  Social History  Narrative   Lives in [place].  Originally from [place].  Children live in [place].   Social Determinants of Health   Financial Resource Strain: Not on file  Food Insecurity: Not on file  Transportation Needs: Not on file  Physical Activity: Not on file  Stress: Not on file  Social Connections: Not on file  Intimate Partner Violence: Not on file    No family history on file.    Review of systems complete and found to be negative unless listed above    PHYSICAL EXAM General: Elderly appearing Caucasian female, well nourished, in no acute distress.  Sitting at incline in ED stretcher with husband at bedside. HEENT:  Normocephalic and atraumatic. Neck:  No JVD.  Lungs: Tachypneic on 4.5 L by nasal cannula.  Poor air movement, scattered expiratory wheezes with coarse breath sounds Heart: HRRR . Normal S1 and S2.  2/6 systolic murmur heard at the RUSB. Abdomen: Non-distended appearing.  Msk: Normal strength and tone for age. Extremities: Right AC with swelling up to IV site.  Chronic skin discoloration in bilateral lower extremities likely 2/2 arterial insufficiency.  No edema. Neuro: Alert and oriented X 3. Psych:  Answers questions appropriately.   Labs:   Lab Results  Component Value Date   WBC 14.4 (H) 11/15/2021   HGB 8.0 (L) 11/15/2021   HCT 25.1 (L) 11/15/2021   MCV 83.1 11/15/2021   PLT 302 11/15/2021    Recent Labs  Lab 11/15/21 0418  NA 135  K 3.2*  CL 100  CO2 27  BUN 45*  CREATININE 1.02*  CALCIUM 8.2*  PROT 5.9*  BILITOT 0.3  ALKPHOS 54  ALT 13  AST 23  GLUCOSE 174*   Lab Results  Component Value Date   TROPONINI <0.30 02/23/2013   No results found for: CHOL No results found for: HDL No results found for: LDLCALC No results found for: TRIG No results found for: CHOLHDL No results found for: LDLDIRECT    Radiology: DG Chest Portable 1 View  Result Date: 11/14/2021 CLINICAL DATA:  Respiratory distress. EXAM: PORTABLE CHEST 1 VIEW  COMPARISON:  Chest radiograph dated April 13, 2019 FINDINGS: The heart is enlarged. Atherosclerotic calcification of the aortic arch. Left lower lobe hazy opacity with silhouetting of the left costophrenic angle. Bilateral mild pleural/parenchymal scarring, unchanged. IMPRESSION: 1. Stable cardiomegaly. 2. Left lower lobe hazy opacity which may represent atelectasis or effusion. Underlying airspace disease can not be excluded. Electronically Signed   By: Keane Police D.O.   On: 11/14/2021 15:59    ECHO 02/2021  1. Left ventricular ejection fraction, by estimation, is 55 to 60%. The  left ventricle has normal function. The  left ventricle has no regional  wall motion abnormalities. Left ventricular diastolic parameters are  consistent with Grade III diastolic  dysfunction (restrictive).   2. Right ventricular systolic function is normal. The right ventricular  size is normal.   3. The mitral valve is myxomatous. Trivial mitral valve regurgitation.   4. The tricuspid valve is myxomatous. Tricuspid valve regurgitation is  mild to moderate.   5. The aortic valve is grossly normal. Aortic valve regurgitation is  mild. Mild to moderate aortic valve sclerosis/calcification is present,  without any evidence of aortic stenosis.   TELEMETRY reviewed by me: Normal sinus rhythm with rate in the 90s during interview.  Periods of tachycardia up to the 130s with DuoNebs.  EKG reviewed by me: Initial EKG 3/7 at 1544 showed sinus rhythm rate 95 PVCs, LAFB, RBBB repeat EKG 3/8 showed sinus tach rate 137  ASSESSMENT AND PLAN:  The patient is an 88 year old female with a past medical history notable for HFpEF (LVEF 55-60% with myxomatous MV, mild AR with g3DD), history of MI 1999, COPD on 4 L at baseline, hypertension, rheumatoid arthritis who presented to [facility] ED with shortness of breath and productive cough for 2 to 3 days.  She presented hypoxic to the 70s and severely dyspneic requiring BiPAP initially.   Cardiology was consulted because of her elevated troponin and chest pain.  #Acute hypoxic respiratory failure 2/2 CAP/COPD (baseline 4 L) #Atypical chest pain The patient presents with a 3-day history of "feeling bad" with increased cough and central chest pain that is nonradiating, nonpleuritic, nonexertional and, intermittent and otherwise difficult to characterize by the patient.  The patient notably has a white count of 22, procalcitonin of 27 and hemoglobin decreasing from 10.7-8 overnight.  Her troponins peaked at 175 yesterday evening, likely related to demand ischemia from her bacterial infection and hypoxia and not ACS. -Agree with treatment of primary infection -Wean oxygen as tolerated, on 4 L at baseline. -Aspirin deferred due to NSAID allergy. -Okay to continue heparin for max of 48 hours with close monitoring of her hemoglobin as it has decreased from 10.7-8 overnight.  Okay to stop if source of bleeding is identified. -Continuous monitoring on telemetry while inpatient -Defer invasive cardiac diagnostics  #HFpEF (LVEF 55-60% with G3 DD, myxomatous MV, mild AR 02/2021) -Appears euvolemic on exam -Echocardiogram complete ordered today  #Hypertension #CAD with history of MI 1999 -Resume home blood pressure medications -She will need to establish care with cardiology after discharge.  #AKI Creatinine on admission 1.5 GFR 34, improving to 1.02, 54 today. Continue to monitor.  This patient's plan of care was discussed and created with Dr. Donnelly Angelica and he is in agreement.  Signed: Tristan Schroeder , PA-C 11/15/2021, 9:55 AM [facility]

## 2021-11-15 NOTE — Assessment & Plan Note (Addendum)
Patient evaluated as outpatient and told to have some chronic, exact details not available. ?Has not complained of pain here. ?Outpatient follow-up. ?

## 2021-11-15 NOTE — Progress Notes (Signed)
?  X-cover Note: ?CTPA negative for PE. Will stop IV heparin per my discussion with attending physician this evening. ? ? ?Kristopher Oppenheim, DO ?[facility] ? ?

## 2021-11-15 NOTE — Assessment & Plan Note (Addendum)
Now on metoprolol 25 Mg twice daily ?Lisinopril increased to prior home dose of 40 Mg daily. ?Resumed HCTZ 12.5 Mg daily. ?

## 2021-11-15 NOTE — ED Notes (Addendum)
Pt placed on 4L [place] at this time to trial off BiPAP  ?

## 2021-11-15 NOTE — Assessment & Plan Note (Addendum)
Hemoglobin has dropped from 10 to 8 g in the absence of overt bleeding. Hemoglobin now up to 9.3.

## 2021-11-15 NOTE — Plan of Care (Signed)
  Problem: Education: Goal: Knowledge of General Education information will improve Description Including pain rating scale, medication(s)/side effects and non-pharmacologic comfort measures Outcome: Progressing   

## 2021-11-15 NOTE — Assessment & Plan Note (Addendum)
Chest pain ?CAD ?Demand ischemia ?Chronic diastolic CHF ?PSVT ?Cardiology input appreciated and suspect demand ischemia in the context of underlying acute respiratory failure and infectious etiology. ?Clinically euvolemic but given degree of DOE and hypoxia, trial of IV Lasix 40 mg x 1. ?Musculoskeletal and reproducible chest pain has resolved ?2D echo: LVEF 55-60%, no regional wall motion abnormalities and grade 1 diastolic dysfunction. ?

## 2021-11-15 NOTE — Hospital Course (Addendum)
88 year old female with medical history significant for CAD, HTN, COPD, chronic respiratory failure with hypoxia on 4 L/min nasal cannula oxygen, rheumatoid arthritis, osteoporosis and GERD presented to the ED with complaints of dyspnea.  She reportedly saw her MD for left hip pain and was diagnosed with a crack in the hip bone and prescribed some meds.  Following taking the meds prescribed, she started experiencing worsening dyspnea.  This was associated with productive cough without fever or chest pain.  Home DuoNebs did not offer relief.  She was directed to the ED.  Admitted for acute on chronic respiratory failure with hypoxia due to suspected COPD exacerbation, community-acquired pneumonia.  Needed BiPAP overnight of admission. Patient and spouse declined SNF recommended by therapies and insisted on going home.  Had significant dyspnea and hypoxia on minimal exertion, pulmonology following.  Persistent SVT/AT on 3/11, cardiology following.  Overall improving very slowly.

## 2021-11-15 NOTE — ED Notes (Signed)
RN at bedside. Pt had BM. Peri care performed and new purewick placed on pt.  ?

## 2021-11-15 NOTE — Assessment & Plan Note (Signed)
Presented with creatinine of 1.5.  Resolved. ?

## 2021-11-15 NOTE — ED Notes (Signed)
After breathing tx pt's HR increased to 130s. Pt denies any new complaints. Pt remains on BiPAP. EKG obtained. MD messaged in regard to pt's HR change.  ?

## 2021-11-15 NOTE — Assessment & Plan Note (Addendum)
Completed 5-day course of IV antibiotics (azithromycin, cefepime >ceftriaxone) for pneumonia. IV Solu-Medrol tapered to 40 mg every 24 hours.  Will change to oral prednisone in a.m. PCCM following.

## 2021-11-16 ENCOUNTER — Encounter: Payer: Self-pay | Admitting: Internal Medicine

## 2021-11-16 DIAGNOSIS — J9621 Acute and chronic respiratory failure with hypoxia: Secondary | ICD-10-CM | POA: Diagnosis not present

## 2021-11-16 LAB — BLOOD GAS, VENOUS
Acid-Base Excess: 3.7 mmol/L — ABNORMAL HIGH (ref 0.0–2.0)
Bicarbonate: 29.7 mmol/L — ABNORMAL HIGH (ref 20.0–28.0)
O2 Saturation: 32.9 %
Patient temperature: 37
pCO2, Ven: 49 mmHg (ref 44–60)
pH, Ven: 7.39 (ref 7.25–7.43)

## 2021-11-16 LAB — CBC
HCT: 26.6 % — ABNORMAL LOW (ref 36.0–46.0)
Hemoglobin: 8.2 g/dL — ABNORMAL LOW (ref 12.0–15.0)
MCH: 26.1 pg (ref 26.0–34.0)
MCHC: 30.8 g/dL (ref 30.0–36.0)
MCV: 84.7 fL (ref 80.0–100.0)
Platelets: 314 10*3/uL (ref 150–400)
RBC: 3.14 MIL/uL — ABNORMAL LOW (ref 3.87–5.11)
RDW: 14.6 % (ref 11.5–15.5)
WBC: 11.2 10*3/uL — ABNORMAL HIGH (ref 4.0–10.5)
nRBC: 0 % (ref 0.0–0.2)

## 2021-11-16 LAB — HEMOGLOBIN A1C
Hgb A1c MFr Bld: 5.6 % (ref 4.8–5.6)
Mean Plasma Glucose: 114 mg/dL

## 2021-11-16 LAB — BASIC METABOLIC PANEL
Anion gap: 6 (ref 5–15)
BUN: 34 mg/dL — ABNORMAL HIGH (ref 8–23)
CO2: 27 mmol/L (ref 22–32)
Calcium: 8.5 mg/dL — ABNORMAL LOW (ref 8.9–10.3)
Chloride: 107 mmol/L (ref 98–111)
Creatinine, Ser: 0.83 mg/dL (ref 0.44–1.00)
GFR, Estimated: >60 mL/min (ref >60)
Glucose, Bld: 105 mg/dL — ABNORMAL HIGH (ref 70–99)
Potassium: 3.7 mmol/L (ref 3.5–5.1)
Sodium: 140 mmol/L (ref 135–145)

## 2021-11-16 LAB — GLUCOSE, CAPILLARY
Glucose-Capillary: 99 mg/dL (ref 70–99)
Glucose-Capillary: 111 mg/dL — ABNORMAL HIGH (ref 70–99)
Glucose-Capillary: 111 mg/dL — ABNORMAL HIGH (ref 70–99)
Glucose-Capillary: 149 mg/dL — ABNORMAL HIGH (ref 70–99)

## 2021-11-16 LAB — LEGIONELLA PNEUMOPHILA SEROGP 1 UR AG: L. pneumophila Serogp 1 Ur Ag: NEGATIVE

## 2021-11-16 LAB — C-REACTIVE PROTEIN: CRP: 27.7 mg/dL — ABNORMAL HIGH (ref <1.0)

## 2021-11-16 LAB — OCCULT BLOOD X 1 CARD TO LAB, STOOL: Fecal Occult Bld: NEGATIVE

## 2021-11-16 LAB — TROPONIN I (HIGH SENSITIVITY)
Troponin I (High Sensitivity): 68 ng/L — ABNORMAL HIGH (ref <18)
Troponin I (High Sensitivity): 66 ng/L — ABNORMAL HIGH (ref <18)

## 2021-11-16 MED ORDER — METOPROLOL TARTRATE 25 MG PO TABS
25.0000 mg | ORAL_TABLET | Freq: Two times a day (BID) | ORAL | Status: DC
Start: 2021-11-16 — End: 2021-11-17
  Administered 2021-11-16 – 2021-11-17 (×3): 25 mg via ORAL
  Filled 2021-11-16 (×3): qty 1

## 2021-11-16 MED ORDER — ASPIRIN EC 81 MG PO TBEC
81.0000 mg | DELAYED_RELEASE_TABLET | Freq: Every day | ORAL | Status: DC
  Administered 2021-11-16 – 2021-11-22 (×7): 81 mg via ORAL
  Filled 2021-11-16 (×7): qty 1

## 2021-11-16 MED ORDER — MELATONIN 5 MG PO TABS
5.0000 mg | ORAL_TABLET | Freq: Every evening | ORAL | Status: DC | PRN
  Administered 2021-11-16 – 2021-11-21 (×6): 5 mg via ORAL
  Filled 2021-11-16 (×6): qty 1

## 2021-11-16 MED ORDER — AZITHROMYCIN 250 MG PO TABS
500.0000 mg | ORAL_TABLET | Freq: Every evening | ORAL | Status: AC
  Administered 2021-11-16 – 2021-11-19 (×4): 500 mg via ORAL
  Filled 2021-11-16 (×4): qty 2

## 2021-11-16 NOTE — Progress Notes (Signed)
PROGRESS NOTE   [name]  MCN:[id number]    DOB: 04-Nov-1932    DOA: 11/14/2021  PCP: [name], MD   I have briefly reviewed patients previous medical records in [facility].  Chief Complaint  Patient presents with   Shortness of Breath    Hospital Course:  88 year old female with medical history significant for CAD, HTN, COPD, chronic respiratory failure with hypoxia on 4 L/min nasal cannula oxygen, rheumatoid arthritis, osteoporosis and GERD presented to the ED with complaints of dyspnea.  She reportedly saw her MD for left hip pain and was diagnosed with a crack in the hip bone and prescribed some meds.  Following taking the meds prescribed, she started experiencing worsening dyspnea.  This was associated with productive cough without fever or chest pain.  Home DuoNebs did not offer relief.  She was directed to the ED.  Admitted for acute on chronic respiratory failure with hypoxia due to suspected COPD exacerbation, community-acquired pneumonia.  Needed BiPAP overnight of admission.  PCCM consulted.  Improving.   Assessment & Plan:  Principal Problem:   Acute and chronic respiratory failure with hypoxia (HCC) Active Problems:   COPD with acute exacerbation (HCC)   CAP (community acquired pneumonia)   Elevated troponin   AKI (acute kidney injury) ([place])   Left hip pain   Essential hypertension   Rheumatoid arthritis (HCC)   Normocytic anemia   Assessment and Plan: * Acute and chronic respiratory failure with hypoxia ([name]) Suspected due to COPD exacerbation and pneumonia. Needed BiPAP overnight of admission Pulmonology consulted and input appreciated.  Suspect COPD exacerbation versus viral LRTI versus atypical pneumonia versus decompensated CHF. Follow-up extensive evaluation ordered by pulmonology.  RVP panel negative.  ESR and CRP significantly elevated. Supportive care with incentive spirometry. Reassess home oxygen requirement prior to  discharge. Due to elevated D-dimer and concern for PE, obtain CTA chest last night, negative for PE.  COPD with acute exacerbation ([name]) Continue antibiotic treatment for pneumonia. Continue IV Solu-Medrol but taper down dose. PCCM following.  CAP (community acquired pneumonia) Continue empirically started IV ceftriaxone and azithromycin Supportive care Follow extensive evaluation recommended by pulmonology. CTA chest 3/8 confirmed new left lower lobe infiltrate with associated small effusion.  Elevated troponin Chest pain CAD Demand ischemia Chronic diastolic CHF PSVT Cardiology input appreciated and suspect demand ischemia in the context of underlying acute respiratory failure and infectious etiology. Clinically euvolemic. Reported chest pain for several hours this morning, worse with inspiration and reproducible.  Likely musculoskeletal related to coughing.  Tylenol. Repeated high-sensitivity troponin and down from prior 2D echo: LVEF 55-60%, no regional wall motion abnormalities and grade 1 diastolic dysfunction. Cardiology follow-up appreciated.  AKI (acute kidney injury) ([name]) Presented with creatinine of 1.5.  Resolved.  Left hip pain Patient evaluated as outpatient and told to have some chronic, exact details not available. Request outpatient records. Therapies evaluation pending review of records.  Normocytic anemia Hemoglobin has dropped from 10 to 8 g in the absence of overt bleeding. Follow CBC in a.m. and transfuse if hemoglobin 7 g or less. Hemoglobin stable in the low 8 g range.  Rheumatoid arthritis ([name]) Reportedly in remission.  Essential hypertension Holding lisinopril and HCTZ due to elevated creatinine.       Body mass index is 21.74 kg/m.  Nutritional Status        Pressure Ulcer:     DVT prophylaxis: heparin injection 5,000 Units Start: 11/16/21 0600 SCDs Start: 11/15/21 2130 SCDs Start: 11/14/21  2031     Code Status: Full Code:   Family Communication: Daughter at bedside. Disposition:  Status is: Inpatient Remains inpatient appropriate because: Severity of illness, IV meds    Consultants:   Pulmonology Cardiology  Procedures:     Antimicrobials:   As above   Subjective:  RN paged because patient was having chest pain.  Promptly arrived at bedside.  Reports ongoing chest pain since 2 AM, intermittent, 3/10 in severity, nonradiating, worse with deep inspiration.  Breathing has improved.  Objective:   Vitals:   11/16/21 0922 11/16/21 0924 11/16/21 1200 11/16/21 1643  BP:   (!) 161/74 (!) 177/78  Pulse:   73 69  Resp:   20 20  Temp:   97.9 F (36.6 C) 98.2 F (36.8 C)  TempSrc:      SpO2: (!) 85% 91% 94% 92%  Weight:      Height:        General exam: Elderly female, small built and frail, chronically ill looking, lying propped up in bed without distress. Respiratory system: Basal crackles, left more than the right but otherwise clear to auscultation without wheezing or rhonchi.  No increased work of breathing.  Reproducible left lower parasternal tenderness. Cardiovascular system: S1 & S2 heard, RRR. No JVD, murmurs, rubs, gallops or clicks. No pedal edema.  Telemetry personally reviewed: Sinus rhythm. Gastrointestinal system: Abdomen is nondistended, soft and nontender. No organomegaly or masses felt. Normal bowel sounds heard. Central nervous system: Alert and oriented. No focal neurological deficits. Extremities: Symmetric 5 x 5 power. Skin: No rashes, lesions or ulcers Psychiatry: Judgement and insight appear normal. Mood & affect appropriate.     Data Reviewed:   I have personally reviewed following labs and imaging studies   CBC: Recent Labs  Lab 11/14/21 1545 11/15/21 0418 11/16/21 0440  WBC 22.2* 14.4* 11.2*  HGB 10.7* 8.0* 8.2*  HCT 34.5* 25.1* 26.6*  MCV 84.4 83.1 84.7  PLT 403* 302 242    Basic Metabolic Panel: Recent Labs  Lab 11/14/21 1545 11/15/21 0418  11/16/21 0440  NA 134* 135 140  K 3.5 3.2* 3.7  CL 96* 100 107  CO2 25 27 27   GLUCOSE 156* 174* 105*  BUN 58* 45* 34*  CREATININE 1.50* 1.02* 0.83  CALCIUM 9.2 8.2* 8.5*  MG  --  2.0  --     Liver Function Tests: Recent Labs  Lab 11/14/21 1545 11/15/21 0418  AST 35 23  ALT 17 13  ALKPHOS 66 54  BILITOT 0.6 0.3  PROT 7.4 5.9*  ALBUMIN 3.6 2.8*    CBG: Recent Labs  Lab 11/16/21 0838 11/16/21 1202 11/16/21 1645  GLUCAP 99 111* 111*    Microbiology Studies:   Recent Results (from the past 240 hour(s))  Blood culture (routine x 2)     Status: None (Preliminary result)   Collection Time: 11/14/21  3:35 PM   Specimen: BLOOD  Result Value Ref Range Status   Specimen Description BLOOD BLRA  Final   Special Requests BOTTLES DRAWN AEROBIC AND ANAEROBIC BCAV  Final   Culture   Final    NO GROWTH 2 DAYS Performed at [facility], [address] [place], [place] [address]    Report Status PENDING  Incomplete  Blood culture (routine x 2)     Status: None (Preliminary result)   Collection Time: 11/14/21  3:40 PM   Specimen: BLOOD  Result Value Ref Range Status   Specimen Description BLOOD RAC  Final  Special Requests BOTTLES DRAWN AEROBIC AND ANAEROBIC BCAV  Final   Culture   Final    NO GROWTH 2 DAYS Performed at [facility], [address] [place], [place] [address]    Report Status PENDING  Incomplete  Resp Panel by RT-PCR (Flu A&B, Covid) Nasopharyngeal Swab     Status: None   Collection Time: 11/14/21  3:45 PM   Specimen: Nasopharyngeal Swab; Nasopharyngeal(NP) swabs in vial transport medium  Result Value Ref Range Status   SARS Coronavirus 2 by RT PCR NEGATIVE NEGATIVE Final    Comment: (NOTE) SARS-CoV-2 target nucleic acids are NOT DETECTED.  The SARS-CoV-2 RNA is generally detectable in upper respiratory specimens during the acute phase of infection. The lowest concentration of SARS-CoV-2 viral copies this assay can detect is 138  copies/mL. A negative result does not preclude SARS-Cov-2 infection and should not be used as the sole basis for treatment or other patient management decisions. A negative result may occur with  improper specimen collection/handling, submission of specimen other than nasopharyngeal swab, presence of viral mutation(s) within the areas targeted by this assay, and inadequate number of viral copies(<138 copies/mL). A negative result must be combined with clinical observations, patient history, and epidemiological information. The expected result is Negative.  Fact Sheet for Patients:  EntrepreneurPulse.com.au  Fact Sheet for Healthcare Providers:  IncredibleEmployment.be  This test is no t yet approved or cleared by the [place] FDA and  has been authorized for detection and/or diagnosis of SARS-CoV-2 by FDA under an Emergency Use Authorization (EUA). This EUA will remain  in effect (meaning this test can be used) for the duration of the COVID-19 declaration under Section 564(b)(1) of the Act, 21 U.S.C.section 360bbb-3(b)(1), unless the authorization is terminated  or revoked sooner.       Influenza A by PCR NEGATIVE NEGATIVE Final   Influenza B by PCR NEGATIVE NEGATIVE Final    Comment: (NOTE) The Xpert Xpress SARS-CoV-2/FLU/RSV plus assay is intended as an aid in the diagnosis of influenza from Nasopharyngeal swab specimens and should not be used as a sole basis for treatment. Nasal washings and aspirates are unacceptable for Xpert Xpress SARS-CoV-2/FLU/RSV testing.  Fact Sheet for Patients: EntrepreneurPulse.com.au  Fact Sheet for Healthcare Providers: IncredibleEmployment.be  This test is not yet approved or cleared by the [place] FDA and has been authorized for detection and/or diagnosis of SARS-CoV-2 by FDA under an Emergency Use Authorization (EUA). This EUA will remain in effect (meaning  this test can be used) for the duration of the COVID-19 declaration under Section 564(b)(1) of the Act, 21 U.S.C. section 360bbb-3(b)(1), unless the authorization is terminated or revoked.  Performed at [facility], [place], [place] [address]   Respiratory (~20 pathogens) panel by PCR     Status: None   Collection Time: 11/15/21  5:30 PM   Specimen: Nasopharyngeal Swab; Respiratory  Result Value Ref Range Status   Adenovirus NOT DETECTED NOT DETECTED Final   Coronavirus 229E NOT DETECTED NOT DETECTED Final    Comment: (NOTE) The Coronavirus on the Respiratory Panel, DOES NOT test for the novel  Coronavirus (2019 nCoV)    Coronavirus HKU1 NOT DETECTED NOT DETECTED Final   Coronavirus NL63 NOT DETECTED NOT DETECTED Final   Coronavirus OC43 NOT DETECTED NOT DETECTED Final   Metapneumovirus NOT DETECTED NOT DETECTED Final   Rhinovirus / Enterovirus NOT DETECTED NOT DETECTED Final   Influenza A NOT DETECTED NOT DETECTED Final   Influenza B NOT  DETECTED NOT DETECTED Final   Parainfluenza Virus 1 NOT DETECTED NOT DETECTED Final   Parainfluenza Virus 2 NOT DETECTED NOT DETECTED Final   Parainfluenza Virus 3 NOT DETECTED NOT DETECTED Final   Parainfluenza Virus 4 NOT DETECTED NOT DETECTED Final   Respiratory Syncytial Virus NOT DETECTED NOT DETECTED Final   Bordetella pertussis NOT DETECTED NOT DETECTED Final   Bordetella Parapertussis NOT DETECTED NOT DETECTED Final   Chlamydophila pneumoniae NOT DETECTED NOT DETECTED Final   Mycoplasma pneumoniae NOT DETECTED NOT DETECTED Final    Comment: Performed at [facility], [place] [address] [place], [place] [address]    Radiology Studies:  CT Angio Chest Pulmonary Embolism (PE) W or WO Contrast  Result Date: 11/15/2021 CLINICAL DATA:  Chronic respiratory failure EXAM: CT ANGIOGRAPHY CHEST WITH CONTRAST TECHNIQUE: Multidetector CT imaging of the chest was performed using the standard protocol during bolus  administration of intravenous contrast. Multiplanar CT image reconstructions and MIPs were obtained to evaluate the vascular anatomy. RADIATION DOSE REDUCTION: This exam was performed according to the departmental dose-optimization program which includes automated exposure control, adjustment of the mA and/or kV according to patient size and/or use of iterative reconstruction technique. CONTRAST:  69m OMNIPAQUE IOHEXOL 300 MG/ML  SOLN COMPARISON:  04/11/2021 FINDINGS: Cardiovascular: Atherosclerotic calcifications of the thoracic aorta are noted. No aneurysmal dilatation or dissection is noted. Mild cardiac enlargement is seen. No pericardial effusion is seen. Coronary calcifications are noted. Pulmonary artery shows a normal branching pattern bilaterally. No intraluminal filling defect is identified. Mediastinum/Nodes: Thoracic inlet is within normal limits. No sizable hilar or mediastinal adenopathy is noted. The esophagus as visualized is within normal limits. Lungs/Pleura: Diffuse emphysematous changes are noted in the lungs bilaterally stable in appearance from the prior exam. Small effusion is noted on the left with lower lobe infiltrate new from the prior study. No sizable parenchymal nodules are seen. Upper Abdomen: Visualized upper abdomen is within normal limits. Musculoskeletal: Degenerative changes of the thoracic spine are noted. No definitive rib abnormality is seen. Bilateral breast implants are seen with evidence of stable intracapsular rupture similar to that noted on the prior exam. Review of the MIP images confirms the above findings. IMPRESSION: No evidence of pulmonary emboli. New left lower lobe infiltrate with associated small effusion. Aortic Atherosclerosis (ICD10-I70.0) and Emphysema (ICD10-J43.9). Electronically Signed   By: MInez CatalinaM.D.   On: 11/15/2021 21:21   ECHOCARDIOGRAM COMPLETE  Result Date: 11/15/2021    ECHOCARDIOGRAM REPORT   Patient Name:   [facility]BIATELUrmc Strong WestDate of  Exam: 11/15/2021 Medical Rec #:  [id number]       Height:       60.0 in Accession #:    [id number]      Weight:       111.3 lb Date of Birth:  [birth year]        BSA:          1.455 m Patient Age:    89years         BP:           161/80 mmHg Patient Gender: F                HR:           93 bpm. Exam Location:  [facility] Procedure: 2D Echo, Cardiac Doppler and Color Doppler Indications:     Chest pain R07.9  History:         Patient has prior history of  Echocardiogram examinations, most                  recent 02/23/2021. CHF, Previous Myocardial Infarction, COPD;                  Risk Factors:Hypertension. SVT.  Sonographer:     Sherrie Sport Referring Phys:  [id number] [facility] Diagnosing Phys: Donnelly Angelica  Sonographer Comments: Technically challenging study due to limited acoustic windows. Image acquisition challenging due to breast implants and Image acquisition challenging due to COPD. IMPRESSIONS  1. Left ventricular ejection fraction, by estimation, is 55 to 60%. The left ventricle has normal function. The left ventricle has no regional wall motion abnormalities. Left ventricular diastolic parameters are consistent with Grade I diastolic dysfunction (impaired relaxation).  2. Right ventricular systolic function is normal. The right ventricular size is not well visualized.  3. Left atrial size was moderately dilated.  4. The mitral valve is degenerative. Mild mitral valve regurgitation. No evidence of mitral stenosis. The mean mitral valve gradient is 5.0 mmHg.  5. The aortic valve is calcified. Aortic valve regurgitation is trivial. Aortic valve sclerosis/calcification is present, without any evidence of aortic stenosis. FINDINGS  Left Ventricle: Left ventricular ejection fraction, by estimation, is 55 to 60%. The left ventricle has normal function. The left ventricle has no regional wall motion abnormalities. The left ventricular internal cavity size was normal in size. There is  no left ventricular hypertrophy.  Left ventricular diastolic parameters are consistent with Grade I diastolic dysfunction (impaired relaxation). Right Ventricle: The right ventricular size is not well visualized. Right vetricular wall thickness was not well visualized. Right ventricular systolic function is normal. Left Atrium: Left atrial size was moderately dilated. Right Atrium: Right atrial size was not well visualized. Pericardium: There is no evidence of pericardial effusion. Mitral Valve: The mitral valve is degenerative in appearance. Mild mitral valve regurgitation. No evidence of mitral valve stenosis. MV peak gradient, 9.6 mmHg. The mean mitral valve gradient is 5.0 mmHg. Tricuspid Valve: The tricuspid valve is normal in structure. Tricuspid valve regurgitation is trivial. Aortic Valve: The aortic valve is calcified. Aortic valve regurgitation is trivial. Aortic valve sclerosis/calcification is present, without any evidence of aortic stenosis. Aortic valve mean gradient measures 4.0 mmHg. Aortic valve peak gradient measures 7.7 mmHg. Aortic valve area, by VTI measures 1.97 cm. Pulmonic Valve: The pulmonic valve was not well visualized. Pulmonic valve regurgitation is not visualized. Aorta: The aortic root is normal in size and structure. IAS/Shunts: The interatrial septum was not well visualized.  LEFT VENTRICLE PLAX 2D LVIDd:         4.30 cm   Diastology LVIDs:         3.10 cm   LV e' medial:    4.03 cm/s LV PW:         1.10 cm   LV E/e' medial:  2.0 LV IVS:        1.10 cm   LV e' lateral:   7.34 cm/s LVOT diam:     2.00 cm   LV E/e' lateral: 1.1 LV SV:         51 LV SV Index:   35 LVOT Area:     3.14 cm  RIGHT VENTRICLE RV S prime:     11.20 cm/s TAPSE (M-mode): 2.0 cm LEFT ATRIUM             Index        RIGHT ATRIUM  Index LA diam:        3.60 cm 2.47 cm/m   RA Area:     14.90 cm LA Vol (A2C):   72.1 ml 49.55 ml/m  RA Volume:   30.50 ml  20.96 ml/m LA Vol (A4C):   49.3 ml 33.88 ml/m LA Biplane Vol: 59.7 ml 41.02 ml/m   AORTIC VALVE                    PULMONIC VALVE AV Area (Vmax):    2.21 cm     PV Vmax:        0.56 m/s AV Area (Vmean):   1.79 cm     PV Vmean:       38.300 cm/s AV Area (VTI):     1.97 cm     PV VTI:         0.114 m AV Vmax:           139.00 cm/s  PV Peak grad:   1.3 mmHg AV Vmean:          94.400 cm/s  PV Mean grad:   1.0 mmHg AV VTI:            0.257 m      RVOT Peak grad: 6 mmHg AV Peak Grad:      7.7 mmHg AV Mean Grad:      4.0 mmHg LVOT Vmax:         97.90 cm/s LVOT Vmean:        53.700 cm/s LVOT VTI:          0.161 m LVOT/AV VTI ratio: 0.63  AORTA Ao Root diam: 2.90 cm MITRAL VALVE                TRICUSPID VALVE MV Area (PHT): 4.31 cm     TR Peak grad:   10.8 mmHg MV Peak grad:  9.6 mmHg     TR Vmax:        164.00 cm/s MV Mean grad:  5.0 mmHg MV Vmax:       1.55 m/s     SHUNTS MV Vmean:      103.0 cm/s   Systemic VTI:  0.16 m MV Decel Time: 176 msec     Systemic Diam: 2.00 cm MV E velocity: 8.05 cm/s    Pulmonic VTI:  0.252 m MV A velocity: 133.00 cm/s MV E/A ratio:  0.06 Donnelly Angelica Electronically signed by Donnelly Angelica Signature Date/Time: 11/15/2021/5:13:23 PM    Final     Scheduled Meds:    aspirin EC  81 mg Oral Daily   azithromycin  500 mg Oral QPM   citalopram  20 mg Oral Daily   fluticasone furoate-vilanterol  1 puff Inhalation Daily   And   umeclidinium bromide  1 puff Inhalation Daily   heparin  5,000 Units Subcutaneous Q8H   insulin aspart  0-6 Units Subcutaneous TID WC   methylPREDNISolone (SOLU-MEDROL) injection  120 mg Intravenous Q12H   metoprolol tartrate  25 mg Oral BID   montelukast  10 mg Oral QHS   pantoprazole  40 mg Oral Daily   sucralfate  1 g Oral QID    Continuous Infusions:    cefTRIAXone (ROCEPHIN)  IV 1 g (11/16/21 1812)     LOS: 2 days     Vernell Leep, MD,  FACP, [facility], [facility], [facility] (Care Management Physician Certified) [place]  To contact the attending provider between 7A-7P  or the covering provider during  after hours 7P-7A, please log into the web site [email or web address] and access using universal [place] password for that web site. If you do not have the password, please call the hospital operator.  11/16/2021, 6:26 PM

## 2021-11-16 NOTE — TOC Initial Note (Addendum)
Transition of Care (TOC) - Initial/Assessment Note  ? ? ?Patient Details  ?Name: [name] ?MRN: [id number] ?Date of Birth: [birth year] ? ?Transition of Care (TOC) CM/SW Contact:    ?Candie Chroman, LCSW ?Phone Number: ?11/16/2021, 9:56 AM ? ?Clinical Narrative:   Readmission prevention screen complete. CSW met with patient. Husband and daughter at bedside. CSW introduced role and explained that discharge planning would be discussed. PCP is Annamary Rummage, MD at [facility] in [place]. Husband typically drives her to appointments but daughter available if needed. Pharmacy is [facility] in [place]. No issues obtaining medications. No home health prior to admission. She has a standard walker, 3-prong cane and built-in shower seat at home. Patient is on 4 L chronic oxygen at home which is provided through [facility]. Family will transport her home at discharge. No further concerns. CSW encouraged patient and her family to contact CSW as needed. CSW will continue to follow patient and her family for support and facilitate return home when stable.            ? ?1:52 pm: PT recommending SNF placement but are hopeful that her oxygen requirements improve enough for her to go home with home health. Patient sleeping. Discussed with husband at bedside. He said they are not really interested in SNF placement if recommended. Will see how she does on a day-to-day basis before making decision. Left list of CMS scores in the room for facilities within 25 miles of their zip code. ? ?Expected Discharge Plan: Home/Self Care ?Barriers to Discharge: Continued Medical Work up ? ? ?Patient Goals and CMS Choice ?  ?  ?  ? ?Expected Discharge Plan and Services ?Expected Discharge Plan: Home/Self Care ?  ?  ?Post Acute Care Choice: NA ?Living arrangements for the past 2 months: [place] ?                ?  ?  ?  ?  ?  ?  ?  ?  ?  ?  ? ?Prior Living Arrangements/Services ?Living arrangements for the past 2 months:  [place] ?Lives with:: Spouse ?Patient language and need for interpreter reviewed:: Yes ?Do you feel safe going back to the place where you live?: Yes      ?Need for Family Participation in Patient Care: Yes (Comment) ?Care giver support system in place?: Yes (comment) ?Current home services: DME ?Criminal Activity/Legal Involvement Pertinent to Current Situation/Hospitalization: No - Comment as needed ? ?Activities of Daily Living ?Home Assistive Devices/Equipment: Gilford Rile (specify type), Grab bars in shower, Oxygen ?ADL Screening (condition at time of admission) ?Patient's cognitive ability adequate to safely complete daily activities?: Yes ?Is the patient deaf or have difficulty hearing?: No ?Does the patient have difficulty seeing, even when wearing glasses/contacts?: No ?Does the patient have difficulty concentrating, remembering, or making decisions?: No ?Patient able to express need for assistance with ADLs?: Yes ?Does the patient have difficulty dressing or bathing?: No ?Independently performs ADLs?: Yes (appropriate for developmental age) ?Does the patient have difficulty walking or climbing stairs?: Yes ?Weakness of Legs: Both ?Weakness of Arms/Hands: None ? ?Permission Sought/Granted ?Permission sought to share information with : Family Supports ?Permission granted to share information with : Yes, Verbal Permission Granted ? Share Information with NAME: [place] and Rether Rison ?   ? Permission granted to share info w Relationship: Husband and daughter ? Permission granted to share info w Contact Information: [place]: [phone]: [phone] ? ?Emotional Assessment ?Appearance:: Appears  stated age ?Attitude/Demeanor/Rapport: Engaged, Gracious ?Affect (typically observed): Accepting, Appropriate, Calm, Pleasant ?Orientation: : Oriented to Self, Oriented to Place, Oriented to  Time, Oriented to Situation ?Alcohol / Substance Use: Not Applicable ?Psych Involvement: No  (comment) ? ?Admission diagnosis:  COPD exacerbation ([place]) [J44.1] ?Acute respiratory failure with hypoxia ([place]) [J96.01] ?Acute and chronic respiratory failure with hypoxia ([place]) [J96.21] ?Patient Active Problem List  ? Diagnosis Date Noted  ? CAP (community acquired pneumonia) 11/15/2021  ? Left hip pain 11/15/2021  ? Elevated troponin 11/15/2021  ? AKI (acute kidney injury) ([place]) 11/15/2021  ? Normocytic anemia 11/15/2021  ? Acute and chronic respiratory failure with hypoxia ([place]) 11/14/2021  ? Acute hypoxemic respiratory failure ([place])   ? Paroxysmal tachycardia ([place])   ? Acute diastolic CHF (congestive heart failure) ([place])   ? Anxiety and depression   ? Acute on chronic respiratory failure with hypoxia ([place]) 04/12/2021  ? Sinus tachycardia   ? Elevated brain natriuretic peptide (BNP) level   ? Weakness   ? Dyspnea 04/11/2021  ? COPD, moderate ([place]) 04/07/2014  ? 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 Rheumatoid arthritis ([place]) 02/22/2013  ? Chest pain 02/22/2013  ? Diarrhea 02/22/2013  ? PERIPHERAL VASCULAR DISEASE 05/20/2007  ? HYPERLIPIDEMIA 05/19/2007  ? Essential hypertension 05/19/2007  ? MYOCARDIAL INFARCTION, HX OF 05/19/2007  ? CORONARY ARTERY DISEASE 05/19/2007  ? ASTHMA 05/19/2007  ? COPD with acute exacerbation ([place]) 05/19/2007  ? GERD 05/19/2007  ? OSTEOPOROSIS 05/19/2007  ? ?PCP:  [name], MD ?Pharmacy:   ?[facility] [place] [facility] [address] ?[place] [address] ?Phone: [phone] Fax: [phone] ? ? ? ? ?Social Determinants of Health (SDOH) Interventions ?  ? ?Readmission Risk Interventions ?Readmission Risk Prevention Plan 11/16/2021  ?Transportation Screening Complete  ?PCP or Specialist Appt within 3-5 Days Complete  ?Social Work Consult for [place] Planning/Counseling Complete  ?Palliative Care Screening Not Applicable  ?Medication Review Press photographer) Complete  ?Some recent data might be hidden  ? ? ? ?

## 2021-11-16 NOTE — Plan of Care (Signed)
?  Problem: Education: ?Goal: Knowledge of General Education information will improve ?Description: Including pain rating scale, medication(s)/side effects and non-pharmacologic comfort measures ?11/16/2021 1435 by Emmaline Life, RN ?Outcome: Progressing ?11/16/2021 1435 by Emmaline Life, RN ?Outcome: Progressing ?  ?Problem: Education: ?Goal: Knowledge of General Education information will improve ?Description: Including pain rating scale, medication(s)/side effects and non-pharmacologic comfort measures ?11/16/2021 1435 by Emmaline Life, RN ?Outcome: Progressing ?11/16/2021 1435 by Emmaline Life, RN ?Outcome: Progressing ?  ?Problem: Health Behavior/Discharge Planning: ?Goal: Ability to manage health-related needs will improve ?Outcome: Progressing ?  ?

## 2021-11-16 NOTE — Plan of Care (Signed)
  Problem: Education: Goal: Knowledge of General Education information will improve Description Including pain rating scale, medication(s)/side effects and non-pharmacologic comfort measures Outcome: Progressing   

## 2021-11-16 NOTE — Progress Notes (Signed)
[place] NOTE       Patient ID: [name] MRN: [id number] DOB/AGE: 1932/12/17 88 y.o.  Admit date: 11/14/2021 Referring Physician Dr. Algis Liming Primary Physician Lloyd Huger  Primary Cardiologist in [place] (last seen 1 year ago)  Reason for Consultation atypical chest pain   HPI: The patient is an 88 year old female with a past medical history notable for HFpEF (LVEF 55-60% with myxomatous MV, mild AR with g3DD), history of MI 1999, COPD on 4 L at baseline, hypertension, rheumatoid arthritis who presented to [facility] ED with shortness of breath and productive cough for 2 to 3 days.  She presented hypoxic to the 70s and severely dyspneic requiring BiPAP initially.  Cardiology was consulted because of her elevated troponin and chest pain.  Interval History: -admitted to central chest pain this morning, nonradiating, nonexertional without any aggravating or relieving factors again.  -repeat troponin at 68, downtrending form yesterday at 61 -review of tele shows a short period of SVT and repeat EKG shows NSR and rate of 79 (unchanged from prior)  -breathing is not at baseline and still feels badly  Review of systems complete and found to be negative unless listed above     Past Medical History:  Diagnosis Date   Anxiety    Asthma    CHF (congestive heart failure) (HCC)    COPD (chronic obstructive pulmonary disease) ([place])    Hypertension    Melanoma ([place])    scalp (2007) & leg (2013)   Myocardial infarction ([place]) 09/10/1997   no PCI or CABG   Osteoporosis    Rheumatoid arthritis ([place])    SVT (supraventricular tachycardia) ([place])     Past Surgical History:  Procedure Laterality Date   ABDOMINAL HYSTERECTOMY     BREAST SURGERY     CARDIAC SURGERY     CARPAL TUNNEL RELEASE     MELANOMA EXCISION  2007   scalp   MELANOMA EXCISION Right 2013   lower leg   SHOULDER SURGERY     TOTAL HIP REVISION  2015   VIDEO BRONCHOSCOPY Bilateral 04/08/2014    Procedure: VIDEO BRONCHOSCOPY WITHOUT FLUORO;  Surgeon: Juanito Doom, MD;  Location: [facility] ENDOSCOPY;  Service: Cardiopulmonary;  Laterality: Bilateral;    Medications Prior to Admission  Medication Sig Dispense Refill Last Dose   acetaminophen (TYLENOL) 325 MG tablet Take 650 mg by mouth every 6 (six) hours as needed.   11/14/2021   albuterol (PROVENTIL) (2.5 MG/3ML) 0.083% nebulizer solution Inhale 3 mLs into the lungs every 6 (six) hours as needed.   11/14/2021   Cholecalciferol (VITAMIN D PO) Take 25 mcg by mouth daily.   11/14/2021   citalopram (CELEXA) 20 MG tablet Take 20 mg by mouth daily.   11/14/2021   fish oil-omega-3 fatty acids 1000 MG capsule Take 2 g by mouth daily.   11/14/2021   hydrochlorothiazide (HYDRODIURIL) 12.5 MG tablet Take 12.5 mg by mouth daily.   11/14/2021   ipratropium-albuterol (DUONEB) 0.5-2.5 (3) MG/3ML SOLN Take 3 mLs by nebulization every 6 (six) hours as needed.   11/13/2021   lisinopril (ZESTRIL) 40 MG tablet Take 40 mg by mouth daily.   11/14/2021   montelukast (SINGULAIR) 10 MG tablet Take 10 mg by mouth at bedtime.   11/13/2021   omeprazole (PRILOSEC) 20 MG capsule Take 20 mg by mouth 2 (two) times daily before a meal.   11/14/2021   sucralfate (CARAFATE) 1 g tablet Take 1 g by mouth 4 (four) times daily.  11/13/2021   traMADol (ULTRAM) 50 MG tablet Take 50 mg by mouth 2 (two) times daily as needed.   11/13/2021   TRELEGY ELLIPTA 100-62.5-25 MCG/INH AEPB Take 1 puff by mouth daily.   11/14/2021   acetaminophen-codeine (TYLENOL #3) 300-30 MG tablet Take 1 tablet by mouth every 4 (four) hours as needed. (Patient not taking: Reported on 04/20/2021)      alendronate (FOSAMAX) 70 MG tablet Take 70 mg by mouth once a week.   11/10/2021   bisoprolol (ZEBETA) 5 MG tablet Take 0.5 tablets (2.5 mg total) by mouth at bedtime. (Patient not taking: Reported on 11/14/2021) 30 tablet 0 Not Taking   diltiazem (CARDIZEM CD) 180 MG 24 hr capsule Take 1 capsule (180 mg total) by mouth daily. (Patient  not taking: Reported on 11/14/2021) 30 capsule 0 Not Taking   furosemide (LASIX) 20 MG tablet Take 1 tablet (20 mg total) by mouth daily. (Patient not taking: Reported on 11/14/2021) 30 tablet 0 Not Taking   nitroGLYCERIN (NITROSTAT) 0.4 MG SL tablet Place 1 tablet (0.4 mg total) under the tongue every 5 (five) minutes as needed for chest pain. 30 tablet 3 prn   potassium chloride SA (KLOR-CON) 20 MEQ tablet Take 1 tablet (20 mEq total) by mouth daily. (Patient not taking: Reported on 11/14/2021) 30 tablet 0 Not Taking   tizanidine (ZANAFLEX) 2 MG capsule Take 2 mg by mouth 3 (three) times daily. (Patient not taking: Reported on 11/14/2021)   Not Taking    Social History   Socioeconomic History   Marital status: Married    Spouse name: Not on file   Number of children: Not on file   Years of education: Not on file   Highest education level: Not on file  Occupational History   Not on file  Tobacco Use   Smoking status: Former    Packs/day: 1.50    Years: 22.00    Pack years: 33.00    Types: Cigarettes    Quit date: 04/07/1974    Years since quitting: 47.6   Smokeless tobacco: Never  Substance and Sexual Activity   Alcohol use: No   Drug use: No   Sexual activity: Never  Other Topics Concern   Not on file  Social History Narrative   Lives in [place].  Originally from [place].  Children live in [place].   Social Determinants of Health   Financial Resource Strain: Not on file  Food Insecurity: Not on file  Transportation Needs: Not on file  Physical Activity: Not on file  Stress: Not on file  Social Connections: Not on file  Intimate Partner Violence: Not on file    No family history on file.    Review of systems complete and found to be negative unless listed above    PHYSICAL EXAM General: Elderly appearing Caucasian female, well nourished, in no acute distress.  Sitting upright in PCU bed. HEENT:  Normocephalic and atraumatic. Neck:  No JVD.  Lungs: Tachypneic on  5.0 L by nasal cannula.  Poor air movement, coarse breath sounds, no wheezes Heart: HRRR . Normal S1 and S2.  3/6 systolic murmur heard at the RUSB and apex  Abdomen: Non-distended appearing.  Msk: Normal strength and tone for age. Extremities: Chronic skin discoloration in bilateral lower extremities likely 2/2 arterial insufficiency.  No edema. Neuro: Alert and oriented X 3. Psych:  Answers questions appropriately.   Labs:   Lab Results  Component Value Date   WBC 11.2 (H) 11/16/2021  HGB 8.2 (L) 11/16/2021   HCT 26.6 (L) 11/16/2021   MCV 84.7 11/16/2021   PLT 314 11/16/2021    Recent Labs  Lab 11/15/21 0418 11/16/21 0440  NA 135 140  K 3.2* 3.7  CL 100 107  CO2 27 27  BUN 45* 34*  CREATININE 1.02* 0.83  CALCIUM 8.2* 8.5*  PROT 5.9*  --   BILITOT 0.3  --   ALKPHOS 54  --   ALT 13  --   AST 23  --   GLUCOSE 174* 105*    Lab Results  Component Value Date   TROPONINI <0.30 02/23/2013    No results found for: CHOL No results found for: HDL No results found for: LDLCALC No results found for: TRIG No results found for: CHOLHDL No results found for: LDLDIRECT    Radiology: CT Angio Chest Pulmonary Embolism (PE) W or WO Contrast  Result Date: 11/15/2021 CLINICAL DATA:  Chronic respiratory failure EXAM: CT ANGIOGRAPHY CHEST WITH CONTRAST TECHNIQUE: Multidetector CT imaging of the chest was performed using the standard protocol during bolus administration of intravenous contrast. Multiplanar CT image reconstructions and MIPs were obtained to evaluate the vascular anatomy. RADIATION DOSE REDUCTION: This exam was performed according to the departmental dose-optimization program which includes automated exposure control, adjustment of the mA and/or kV according to patient size and/or use of iterative reconstruction technique. CONTRAST:  40m OMNIPAQUE IOHEXOL 300 MG/ML  SOLN COMPARISON:  04/11/2021 FINDINGS: Cardiovascular: Atherosclerotic calcifications of the thoracic aorta  are noted. No aneurysmal dilatation or dissection is noted. Mild cardiac enlargement is seen. No pericardial effusion is seen. Coronary calcifications are noted. Pulmonary artery shows a normal branching pattern bilaterally. No intraluminal filling defect is identified. Mediastinum/Nodes: Thoracic inlet is within normal limits. No sizable hilar or mediastinal adenopathy is noted. The esophagus as visualized is within normal limits. Lungs/Pleura: Diffuse emphysematous changes are noted in the lungs bilaterally stable in appearance from the prior exam. Small effusion is noted on the left with lower lobe infiltrate new from the prior study. No sizable parenchymal nodules are seen. Upper Abdomen: Visualized upper abdomen is within normal limits. Musculoskeletal: Degenerative changes of the thoracic spine are noted. No definitive rib abnormality is seen. Bilateral breast implants are seen with evidence of stable intracapsular rupture similar to that noted on the prior exam. Review of the MIP images confirms the above findings. IMPRESSION: No evidence of pulmonary emboli. New left lower lobe infiltrate with associated small effusion. Aortic Atherosclerosis (ICD10-I70.0) and Emphysema (ICD10-J43.9). Electronically Signed   By: MInez CatalinaM.D.   On: 11/15/2021 21:21   DG Chest Portable 1 View  Result Date: 11/14/2021 CLINICAL DATA:  Respiratory distress. EXAM: PORTABLE CHEST 1 VIEW COMPARISON:  Chest radiograph dated April 13, 2019 FINDINGS: The heart is enlarged. Atherosclerotic calcification of the aortic arch. Left lower lobe hazy opacity with silhouetting of the left costophrenic angle. Bilateral mild pleural/parenchymal scarring, unchanged. IMPRESSION: 1. Stable cardiomegaly. 2. Left lower lobe hazy opacity which may represent atelectasis or effusion. Underlying airspace disease can not be excluded. Electronically Signed   By: IKeane PoliceD.O.   On: 11/14/2021 15:59   ECHOCARDIOGRAM COMPLETE  Result Date:  11/15/2021    ECHOCARDIOGRAM REPORT   Patient Name:   [facility]ML[facility] LPDate of Exam: 11/15/2021 Medical Rec #:  [id number]       Height:       60.0 in Accession #:    [id number]      Weight:  111.3 lb Date of Birth:  08/07/1933         BSA:          1.455 m Patient Age:    81 years         BP:           161/80 mmHg Patient Gender: F                HR:           93 bpm. Exam Location:  [facility] Procedure: 2D Echo, Cardiac Doppler and Color Doppler Indications:     Chest pain R07.9  History:         Patient has prior history of Echocardiogram examinations, most                  recent 02/23/2021. CHF, Previous Myocardial Infarction, COPD;                  Risk Factors:Hypertension. SVT.  Sonographer:     Sherrie Sport Referring Phys:  [id number] [facility] [place] Diagnosing Phys: Donnelly Angelica  Sonographer Comments: Technically challenging study due to limited acoustic windows. Image acquisition challenging due to breast implants and Image acquisition challenging due to COPD. IMPRESSIONS  1. Left ventricular ejection fraction, by estimation, is 55 to 60%. The left ventricle has normal function. The left ventricle has no regional wall motion abnormalities. Left ventricular diastolic parameters are consistent with Grade I diastolic dysfunction (impaired relaxation).  2. Right ventricular systolic function is normal. The right ventricular size is not well visualized.  3. Left atrial size was moderately dilated.  4. The mitral valve is degenerative. Mild mitral valve regurgitation. No evidence of mitral stenosis. The mean mitral valve gradient is 5.0 mmHg.  5. The aortic valve is calcified. Aortic valve regurgitation is trivial. Aortic valve sclerosis/calcification is present, without any evidence of aortic stenosis. FINDINGS  Left Ventricle: Left ventricular ejection fraction, by estimation, is 55 to 60%. The left ventricle has normal function. The left ventricle has no regional wall motion abnormalities. The left ventricular  internal cavity size was normal in size. There is  no left ventricular hypertrophy. Left ventricular diastolic parameters are consistent with Grade I diastolic dysfunction (impaired relaxation). Right Ventricle: The right ventricular size is not well visualized. Right vetricular wall thickness was not well visualized. Right ventricular systolic function is normal. Left Atrium: Left atrial size was moderately dilated. Right Atrium: Right atrial size was not well visualized. Pericardium: There is no evidence of pericardial effusion. Mitral Valve: The mitral valve is degenerative in appearance. Mild mitral valve regurgitation. No evidence of mitral valve stenosis. MV peak gradient, 9.6 mmHg. The mean mitral valve gradient is 5.0 mmHg. Tricuspid Valve: The tricuspid valve is normal in structure. Tricuspid valve regurgitation is trivial. Aortic Valve: The aortic valve is calcified. Aortic valve regurgitation is trivial. Aortic valve sclerosis/calcification is present, without any evidence of aortic stenosis. Aortic valve mean gradient measures 4.0 mmHg. Aortic valve peak gradient measures 7.7 mmHg. Aortic valve area, by VTI measures 1.97 cm. Pulmonic Valve: The pulmonic valve was not well visualized. Pulmonic valve regurgitation is not visualized. Aorta: The aortic root is normal in size and structure. IAS/Shunts: The interatrial septum was not well visualized.  LEFT VENTRICLE PLAX 2D LVIDd:         4.30 cm   Diastology LVIDs:         3.10 cm   LV e' medial:    4.03 cm/s LV PW:  1.10 cm   LV E/e' medial:  2.0 LV IVS:        1.10 cm   LV e' lateral:   7.34 cm/s LVOT diam:     2.00 cm   LV E/e' lateral: 1.1 LV SV:         51 LV SV Index:   35 LVOT Area:     3.14 cm  RIGHT VENTRICLE RV S prime:     11.20 cm/s TAPSE (M-mode): 2.0 cm LEFT ATRIUM             Index        RIGHT ATRIUM           Index LA diam:        3.60 cm 2.47 cm/m   RA Area:     14.90 cm LA Vol (A2C):   72.1 ml 49.55 ml/m  RA Volume:   30.50 ml   20.96 ml/m LA Vol (A4C):   49.3 ml 33.88 ml/m LA Biplane Vol: 59.7 ml 41.02 ml/m  AORTIC VALVE                    PULMONIC VALVE AV Area (Vmax):    2.21 cm     PV Vmax:        0.56 m/s AV Area (Vmean):   1.79 cm     PV Vmean:       38.300 cm/s AV Area (VTI):     1.97 cm     PV VTI:         0.114 m AV Vmax:           139.00 cm/s  PV Peak grad:   1.3 mmHg AV Vmean:          94.400 cm/s  PV Mean grad:   1.0 mmHg AV VTI:            0.257 m      RVOT Peak grad: 6 mmHg AV Peak Grad:      7.7 mmHg AV Mean Grad:      4.0 mmHg LVOT Vmax:         97.90 cm/s LVOT Vmean:        53.700 cm/s LVOT VTI:          0.161 m LVOT/AV VTI ratio: 0.63  AORTA Ao Root diam: 2.90 cm MITRAL VALVE                TRICUSPID VALVE MV Area (PHT): 4.31 cm     TR Peak grad:   10.8 mmHg MV Peak grad:  9.6 mmHg     TR Vmax:        164.00 cm/s MV Mean grad:  5.0 mmHg MV Vmax:       1.55 m/s     SHUNTS MV Vmean:      103.0 cm/s   Systemic VTI:  0.16 m MV Decel Time: 176 msec     Systemic Diam: 2.00 cm MV E velocity: 8.05 cm/s    Pulmonic VTI:  0.252 m MV A velocity: 133.00 cm/s MV E/A ratio:  0.06 Donnelly Angelica Electronically signed by Donnelly Angelica Signature Date/Time: 11/15/2021/5:13:23 PM    Final     ECHO 02/2021  1. Left ventricular ejection fraction, by estimation, is 55 to 60%. The  left ventricle has normal function. The left ventricle has no regional  wall motion abnormalities. Left ventricular diastolic parameters are  consistent with Grade III diastolic  dysfunction (restrictive).  2. Right ventricular systolic function is normal. The right ventricular  size is normal.   3. The mitral valve is myxomatous. Trivial mitral valve regurgitation.   4. The tricuspid valve is myxomatous. Tricuspid valve regurgitation is  mild to moderate.   5. The aortic valve is grossly normal. Aortic valve regurgitation is  mild. Mild to moderate aortic valve sclerosis/calcification is present,  without any evidence of aortic stenosis.   TELEMETRY  reviewed by me: Normal sinus rhythm with rate in the 90s during interview.  Periods of tachycardia up to the 130s with DuoNebs.  EKG reviewed by me: Initial EKG 3/7 at 1544 showed sinus rhythm rate 95 PVCs, LAFB, RBBB repeat EKG 3/8 showed sinus tach rate 137  ASSESSMENT AND PLAN:  The patient is an 88 year old female with a past medical history notable for HFpEF (LVEF 55-60% with myxomatous MV, mild AR with g3DD), history of MI 1999, COPD on 4 L at baseline, hypertension, rheumatoid arthritis who presented to [facility] ED with shortness of breath and productive cough for 2 to 3 days.  She presented hypoxic to the 70s and severely dyspneic requiring BiPAP initially.  Cardiology was consulted because of her elevated troponin and chest pain.  #Acute hypoxic respiratory failure 2/2 CAP/COPD (baseline 4 L) #Atypical chest pain #paroxysmal SVT The patient presents with a 3-day history of "feeling bad" with increased cough and central chest pain that is nonradiating, nonpleuritic, nonexertional and, intermittent and otherwise difficult to characterize by the patient.  The patient notably has a white count of 22, procalcitonin of 27 and hemoglobin decreasing from 10.7-8 overnight.  Her troponins peaked at 175 yesterday evening, likely related to demand ischemia from her bacterial infection and hypoxia and not ACS. She had another episode of chest pain this morning with negative troponins and nonischemic EKG - tele review showed a short period of SVT, likely the etiology of her pain.  -Agree with treatment of primary infection -Wean oxygen as tolerated, on 4 L at baseline. -patient denies having any problem taking aspirin and does not remember having an NSAID allergy. Will start '81mg'$  ASA daily.  - start beta blocker with metoprolol tartrate '25mg'$  BID  -s/p heparin gtt, ended 3/8 -Continuous monitoring on telemetry while inpatient -Defer invasive cardiac diagnostics.  #HFpEF (LVEF 55-60% w G1dd, mild MR, aortic  sclerosis) -Appears euvolemic on exam -Echo results as above  #Hypertension #CAD with history of MI 1999 -Resume home blood pressure medications -She will need to establish care with cardiology after discharge.  #AKI, resolved Creatinine on admission 1.5 GFR 34, back to 0.83 and >60 today.   This patient's plan of care was discussed and created with Dr. Donnelly Angelica and he is in agreement.  Signed: Tristan Schroeder , PA-C 11/16/2021, 10:05 AM [facility]

## 2021-11-16 NOTE — Plan of Care (Signed)
?  Problem: Education: ?Goal: Knowledge of General Education information will improve ?Description: Including pain rating scale, medication(s)/side effects and non-pharmacologic comfort measures ?11/16/2021 1436 by Emmaline Life, RN ?Outcome: Progressing ?11/16/2021 1435 by Emmaline Life, RN ?Outcome: Progressing ?11/16/2021 1435 by Emmaline Life, RN ?Outcome: Progressing ?  ?Problem: Health Behavior/Discharge Planning: ?Goal: Ability to manage health-related needs will improve ?11/16/2021 1436 by Emmaline Life, RN ?Outcome: Progressing ?11/16/2021 1435 by Emmaline Life, RN ?Outcome: Progressing ?  ?

## 2021-11-16 NOTE — Progress Notes (Signed)
PULMONOLOGY         Date: 11/16/2021,   MRN# [id number] [name] November 02, 1932     AdmissionWeight: 50.5 kg                 CurrentWeight: 50.5 kg   Referring physician: Dr Algis Liming   CHIEF COMPLAINT:   Acute on chronic hypoxemic respiratory failure   HISTORY OF PRESENT ILLNESS   This is an 88 yo F with hx of chronic lung disease with Asthma COPD overlap syndrome (ACOS) and chronic hypoxemia on 4L/min [place] as well as RA , GERD, HTN. She does have husband at bedside and he is able to help with details of history.   Patient reports that home RN was able to evaluate her and noted that she was in distress with labored breathing and asked for patient to be seen in ER.   On admission to ER she required BIPAP 10/5 40% FioO2.   She had chest imaging done via CXR with no grossly appreciable infiltrate with chronic opacification due to breat implants.  CT angio done Aug 2022 reviewed independently which does show chronic bronchitic changes with crowding of right basal bronchial segments.  No fibrotic changes to suggest RA-ILD.   Blood work with AKI and hypoalbuminemia with hypokalemia.    She received IV fluids and after this the CBC did show dilutional effects with anemia and other cell lines reduced in parallel which is expected. Patient denies having hemoptysis but did report loose stools unsure if there is blood in it.   She does have leukocytosis on arrival. She was placed on heparin due to notable troponin elevation and had cardiology evaluation while in ER.   11/16/21- patient is improved, I reviewed CT chest findings with her and husband at bedside.   PAST MEDICAL HISTORY   Past Medical History:  Diagnosis Date   Anxiety    Asthma    CHF (congestive heart failure) (HCC)    COPD (chronic obstructive pulmonary disease) ([place])    Hypertension    Melanoma ([place])    scalp (2007) & leg (2013)   Myocardial infarction ([place]) 09/10/1997   no PCI or CABG   Osteoporosis     Rheumatoid arthritis (HCC)    SVT (supraventricular tachycardia) ([place])      SURGICAL HISTORY   Past Surgical History:  Procedure Laterality Date   ABDOMINAL HYSTERECTOMY     BREAST SURGERY     CARDIAC SURGERY     CARPAL TUNNEL RELEASE     MELANOMA EXCISION  2007   scalp   MELANOMA EXCISION Right 2013   lower leg   SHOULDER SURGERY     TOTAL HIP REVISION  2015   VIDEO BRONCHOSCOPY Bilateral 04/08/2014   Procedure: VIDEO BRONCHOSCOPY WITHOUT FLUORO;  Surgeon: Juanito Doom, MD;  Location: [facility] ENDOSCOPY;  Service: Cardiopulmonary;  Laterality: Bilateral;     FAMILY HISTORY   No family history on file.   SOCIAL HISTORY   Social History   Tobacco Use   Smoking status: Former    Packs/day: 1.50    Years: 22.00    Pack years: 33.00    Types: Cigarettes    Quit date: 04/07/1974    Years since quitting: 47.6   Smokeless tobacco: Never  Substance Use Topics   Alcohol use: No   Drug use: No     MEDICATIONS    Home Medication:     Current Medication:  Current Facility-Administered Medications:  acetaminophen (TYLENOL) tablet 650 mg, 650 mg, Oral, Q6H PRN, Cristela Felt, Kathe Becton, MD, 650 mg at 11/16/21 0906   aspirin EC tablet 81 mg, 81 mg, Oral, Daily, Tang, [address] PA-C, 81 mg at 11/16/21 1215   azithromycin (ZITHROMAX) tablet 500 mg, 500 mg, Oral, QPM, Hongalgi, Anand D, MD   cefTRIAXone (ROCEPHIN) 1 g in sodium chloride 0.9 % 100 mL IVPB, 1 g, Intravenous, Q24H, Benita Gutter, RPH, Last Rate: 200 mL/hr at 11/16/21 0232, Rate Change at 11/16/21 0232   citalopram (CELEXA) tablet 20 mg, 20 mg, Oral, Daily, Benita Gutter, RPH, 20 mg at 11/16/21 0903   fluticasone furoate-vilanterol (BREO ELLIPTA) 100-25 MCG/ACT 1 puff, 1 puff, Inhalation, Daily, 1 puff at 11/16/21 1215 **AND** umeclidinium bromide (INCRUSE ELLIPTA) 62.5 MCG/ACT 1 puff, 1 puff, Inhalation, Daily, [place], Kathe Becton, MD, 1 puff at 11/16/21 1216   heparin injection 5,000 Units, 5,000  Units, Subcutaneous, Q8H, Kristopher Oppenheim, DO, 5,000 Units at 11/16/21 0654   hydrALAZINE (APRESOLINE) injection 10 mg, 10 mg, Intravenous, Q6H PRN, Hongalgi, Anand D, MD   insulin aspart (novoLOG) injection 0-6 Units, 0-6 Units, Subcutaneous, TID WC, Hongalgi, Anand D, MD   ipratropium-albuterol (DUONEB) 0.5-2.5 (3) MG/3ML nebulizer solution 3 mL, 3 mL, Nebulization, Q4H PRN, Ottie Glazier, MD, 3 mL at 11/16/21 6812   methylPREDNISolone sodium succinate (SOLU-MEDROL) 125 mg/2 mL injection 120 mg, 120 mg, Intravenous, Q12H, Benita Gutter, RPH, 120 mg at 11/16/21 0654   metoprolol tartrate (LOPRESSOR) tablet 25 mg, 25 mg, Oral, BID, Tang, Lily Michelle, PA-C, 25 mg at 11/16/21 1215   montelukast (SINGULAIR) tablet 10 mg, 10 mg, Oral, QHS, [place], Kathe Becton, MD, 10 mg at 11/15/21 2229   nitroGLYCERIN (NITROSTAT) SL tablet 0.4 mg, 0.4 mg, Sublingual, Q5 min PRN, [place], Kathe Becton, MD   pantoprazole (PROTONIX) EC tablet 40 mg, 40 mg, Oral, Daily, Benita Gutter, RPH, 40 mg at 11/16/21 7517   sucralfate (CARAFATE) tablet 1 g, 1 g, Oral, QID, [place], Kathe Becton, MD, 1 g at 11/16/21 0017    ALLERGIES   Celebrex [celecoxib] and Morphine and related     REVIEW OF SYSTEMS    Review of Systems:  Gen:  Denies  fever, sweats, chills weigh loss  HEENT: Denies blurred vision, double vision, ear pain, eye pain, hearing loss, nose bleeds, sore throat Cardiac:  No dizziness, chest pain or heaviness, chest tightness,edema Resp:   Denies cough or sputum porduction, shortness of breath,wheezing, hemoptysis,  Gi: Denies swallowing difficulty, stomach pain, nausea or vomiting, diarrhea, constipation, bowel incontinence Gu:  Denies bladder incontinence, burning urine Ext:   Denies Joint pain, stiffness or swelling Skin: Denies  skin rash, easy bruising or bleeding or hives Endoc:  Denies polyuria, polydipsia , polyphagia or weight change Psych:   Denies depression, insomnia or hallucinations   Other:   All other systems negative   VS: BP (!) 161/74 (BP Location: Right Arm)    Pulse 73    Temp 97.9 F (36.6 C)    Resp 20    Ht 5' (1.524 m)    Wt 50.5 kg    SpO2 94%    BMI 21.74 kg/m      PHYSICAL EXAM    GENERAL:NAD, no fevers, chills, no weakness no fatigue HEAD: Normocephalic, atraumatic.  EYES: Pupils equal, round, reactive to light. Extraocular muscles intact. No scleral icterus.  MOUTH: Moist mucosal membrane. Dentition intact. No abscess noted.  EAR, NOSE, THROAT: Clear without exudates. No external lesions.  NECK: Supple. No thyromegaly. No nodules. No JVD.  PULMONARY:reduced air entry bilaterally  CARDIOVASCULAR: S1 and S2. Regular rate and rhythm. No murmurs, rubs, or gallops. No edema. Pedal pulses 2+ bilaterally.  GASTROINTESTINAL: Soft, nontender, nondistended. No masses. Positive bowel sounds. No hepatosplenomegaly.  MUSCULOSKELETAL: No swelling, clubbing, or edema. Range of motion full in all extremities.  NEUROLOGIC: Cranial nerves II through XII are intact. No gross focal neurological deficits. Sensation intact. Reflexes intact.  SKIN: No ulceration, lesions, rashes, or cyanosis. Skin warm and dry. Turgor intact.  PSYCHIATRIC: Mood, affect within normal limits. The patient is awake, alert and oriented x 3. Insight, judgment intact.       IMAGING    CT Angio Chest Pulmonary Embolism (PE) W or WO Contrast  Result Date: 11/15/2021 CLINICAL DATA:  Chronic respiratory failure EXAM: CT ANGIOGRAPHY CHEST WITH CONTRAST TECHNIQUE: Multidetector CT imaging of the chest was performed using the standard protocol during bolus administration of intravenous contrast. Multiplanar CT image reconstructions and MIPs were obtained to evaluate the vascular anatomy. RADIATION DOSE REDUCTION: This exam was performed according to the departmental dose-optimization program which includes automated exposure control, adjustment of the mA and/or kV according to patient size and/or use of  iterative reconstruction technique. CONTRAST:  21m OMNIPAQUE IOHEXOL 300 MG/ML  SOLN COMPARISON:  04/11/2021 FINDINGS: Cardiovascular: Atherosclerotic calcifications of the thoracic aorta are noted. No aneurysmal dilatation or dissection is noted. Mild cardiac enlargement is seen. No pericardial effusion is seen. Coronary calcifications are noted. Pulmonary artery shows a normal branching pattern bilaterally. No intraluminal filling defect is identified. Mediastinum/Nodes: Thoracic inlet is within normal limits. No sizable hilar or mediastinal adenopathy is noted. The esophagus as visualized is within normal limits. Lungs/Pleura: Diffuse emphysematous changes are noted in the lungs bilaterally stable in appearance from the prior exam. Small effusion is noted on the left with lower lobe infiltrate new from the prior study. No sizable parenchymal nodules are seen. Upper Abdomen: Visualized upper abdomen is within normal limits. Musculoskeletal: Degenerative changes of the thoracic spine are noted. No definitive rib abnormality is seen. Bilateral breast implants are seen with evidence of stable intracapsular rupture similar to that noted on the prior exam. Review of the MIP images confirms the above findings. IMPRESSION: No evidence of pulmonary emboli. New left lower lobe infiltrate with associated small effusion. Aortic Atherosclerosis (ICD10-I70.0) and Emphysema (ICD10-J43.9). Electronically Signed   By: MInez CatalinaM.D.   On: 11/15/2021 21:21   DG Chest Portable 1 View  Result Date: 11/14/2021 CLINICAL DATA:  Respiratory distress. EXAM: PORTABLE CHEST 1 VIEW COMPARISON:  Chest radiograph dated April 13, 2019 FINDINGS: The heart is enlarged. Atherosclerotic calcification of the aortic arch. Left lower lobe hazy opacity with silhouetting of the left costophrenic angle. Bilateral mild pleural/parenchymal scarring, unchanged. IMPRESSION: 1. Stable cardiomegaly. 2. Left lower lobe hazy opacity which may represent  atelectasis or effusion. Underlying airspace disease can not be excluded. Electronically Signed   By: IKeane PoliceD.O.   On: 11/14/2021 15:59   ECHOCARDIOGRAM COMPLETE  Result Date: 11/15/2021    ECHOCARDIOGRAM REPORT   [facility] Name[facility]   [facility] of [facility]am[facility] 11[facility]15/2021 Medical Rec #:  [id number]       Height:       60.0 in Accession #:    [id number]      Weight:       111.3 lb Date of Birth:  510-22-1934        BSA:  1.455 m Patient Age:    66 years         BP:           161/80 mmHg Patient Gender: F                HR:           93 bpm. Exam Location:  [facility] Procedure: 2D Echo, Cardiac Doppler and Color Doppler Indications:     Chest pain R07.9  History:         Patient has prior history of Echocardiogram examinations, most                  recent 02/23/2021. CHF, Previous Myocardial Infarction, COPD;                  Risk Factors:Hypertension. SVT.  Sonographer:     Sherrie Sport Referring Phys:  [id number] [facility] P [place] Diagnosing Phys: Donnelly Angelica  Sonographer Comments: Technically challenging study due to limited acoustic windows. Image acquisition challenging due to breast implants and Image acquisition challenging due to COPD. IMPRESSIONS  1. Left ventricular ejection fraction, by estimation, is 55 to 60%. The left ventricle has normal function. The left ventricle has no regional wall motion abnormalities. Left ventricular diastolic parameters are consistent with Grade I diastolic dysfunction (impaired relaxation).  2. Right ventricular systolic function is normal. The right ventricular size is not well visualized.  3. Left atrial size was moderately dilated.  4. The mitral valve is degenerative. Mild mitral valve regurgitation. No evidence of mitral stenosis. The mean mitral valve gradient is 5.0 mmHg.  5. The aortic valve is calcified. Aortic valve regurgitation is trivial. Aortic valve sclerosis/calcification is present, without any evidence of aortic stenosis. FINDINGS  Left Ventricle: Left  ventricular ejection fraction, by estimation, is 55 to 60%. The left ventricle has normal function. The left ventricle has no regional wall motion abnormalities. The left ventricular internal cavity size was normal in size. There is  no left ventricular hypertrophy. Left ventricular diastolic parameters are consistent with Grade I diastolic dysfunction (impaired relaxation). Right Ventricle: The right ventricular size is not well visualized. Right vetricular wall thickness was not well visualized. Right ventricular systolic function is normal. Left Atrium: Left atrial size was moderately dilated. Right Atrium: Right atrial size was not well visualized. Pericardium: There is no evidence of pericardial effusion. Mitral Valve: The mitral valve is degenerative in appearance. Mild mitral valve regurgitation. No evidence of mitral valve stenosis. MV peak gradient, 9.6 mmHg. The mean mitral valve gradient is 5.0 mmHg. Tricuspid Valve: The tricuspid valve is normal in structure. Tricuspid valve regurgitation is trivial. Aortic Valve: The aortic valve is calcified. Aortic valve regurgitation is trivial. Aortic valve sclerosis/calcification is present, without any evidence of aortic stenosis. Aortic valve mean gradient measures 4.0 mmHg. Aortic valve peak gradient measures 7.7 mmHg. Aortic valve area, by VTI measures 1.97 cm. Pulmonic Valve: The pulmonic valve was not well visualized. Pulmonic valve regurgitation is not visualized. Aorta: The aortic root is normal in size and structure. IAS/Shunts: The interatrial septum was not well visualized.  LEFT VENTRICLE PLAX 2D LVIDd:         4.30 cm   Diastology LVIDs:         3.10 cm   LV e' medial:    4.03 cm/s LV PW:         1.10 cm   LV E/e' medial:  2.0 LV IVS:  1.10 cm   LV e' lateral:   7.34 cm/s LVOT diam:     2.00 cm   LV E/e' lateral: 1.1 LV SV:         51 LV SV Index:   35 LVOT Area:     3.14 cm  RIGHT VENTRICLE RV S prime:     11.20 cm/s TAPSE (M-mode): 2.0 cm  LEFT ATRIUM             Index        RIGHT ATRIUM           Index LA diam:        3.60 cm 2.47 cm/m   RA Area:     14.90 cm LA Vol (A2C):   72.1 ml 49.55 ml/m  RA Volume:   30.50 ml  20.96 ml/m LA Vol (A4C):   49.3 ml 33.88 ml/m LA Biplane Vol: 59.7 ml 41.02 ml/m  AORTIC VALVE                    PULMONIC VALVE AV Area (Vmax):    2.21 cm     PV Vmax:        0.56 m/s AV Area (Vmean):   1.79 cm     PV Vmean:       38.300 cm/s AV Area (VTI):     1.97 cm     PV VTI:         0.114 m AV Vmax:           139.00 cm/s  PV Peak grad:   1.3 mmHg AV Vmean:          94.400 cm/s  PV Mean grad:   1.0 mmHg AV VTI:            0.257 m      RVOT Peak grad: 6 mmHg AV Peak Grad:      7.7 mmHg AV Mean Grad:      4.0 mmHg LVOT Vmax:         97.90 cm/s LVOT Vmean:        53.700 cm/s LVOT VTI:          0.161 m LVOT/AV VTI ratio: 0.63  AORTA Ao Root diam: 2.90 cm MITRAL VALVE                TRICUSPID VALVE MV Area (PHT): 4.31 cm     TR Peak grad:   10.8 mmHg MV Peak grad:  9.6 mmHg     TR Vmax:        164.00 cm/s MV Mean grad:  5.0 mmHg MV Vmax:       1.55 m/s     SHUNTS MV Vmean:      103.0 cm/s   Systemic VTI:  0.16 m MV Decel Time: 176 msec     Systemic Diam: 2.00 cm MV E velocity: 8.05 cm/s    Pulmonic VTI:  0.252 m MV A velocity: 133.00 cm/s MV E/A ratio:  0.06 Donnelly Angelica Electronically signed by Donnelly Angelica Signature Date/Time: 11/15/2021/5:13:23 PM    Final          ASSESSMENT/PLAN   Acute on chronic hypoxemic respiratory failure -etiology differential includes acute COPD exacerbation vs viral LRTI vs atypical pneumonia , vs acute blood loss anemia vs acute on chronic diastolic CHF - present on admission  - COVID19 negative   - supplemental O2 during my evaluation 5L/min  - nfectious workup for pneumonia as below -Respiratory  viral panel-negative -serum fungitell -legionella ab -strep pneumoniae ur AG -Histoplasma Ur Ag -sputum resp cultures -reviewed pertinent imaging with patient today - ESR/CRP -severely  elevated  -PT/OT for d/c planning  -please encourage patient to use incentive spirometer few times each hour while hospitalized.    Anemia    Trending down H/h     - fecal occult blood stool - patient reports loose stool      -currently on heparin - consider DC if not needed as cardiology thinks its unlikely ACS   Acute on chronic diastolic CHF    In context of AE COPD vs LRTI   -BNP-significantly elevated     - cardiology consult - appreciate input - Dr Corky Sox and Orinda Kenner     - s/p TTE - reviewed no severe findings   Acute exacerbation of COPD     -Wean down IV solumedrol     -Continue rocephin zithromax for now     -PRN duoneb   - Incruse ellipta   - Breo once daily    - IS and Flutter several times daily    Severe GERD   -Carafate     Thank you for allowing me to participate in the care of this patient.  Patient has at least 1 severe active medical problem which is life threatening and is being managed during this evaluation  Patient/Family are satisfied with care plan and all questions have been answered.  This document was prepared using Dragon voice recognition software and may include unintentional dictation errors.     Ottie Glazier, M.D.  Division of [place]

## 2021-11-16 NOTE — Progress Notes (Signed)
PHARMACIST - PHYSICIAN COMMUNICATION ?DR:   Algis Liming ?CONCERNING: Antibiotic IV to Oral Route Change Policy ? ?RECOMMENDATION: ?This patient is receiving Azithromycin by the intravenous route.  Based on criteria approved by the Pharmacy and Therapeutics Committee, the antibiotic(s) is/are being converted to the equivalent oral dose form(s). ? ? ?DESCRIPTION: ?These criteria include: ?Patient being treated for a respiratory tract infection, urinary tract infection, cellulitis or clostridium difficile associated diarrhea if on metronidazole ?The patient is not neutropenic and does not exhibit a GI malabsorption state ?The patient is eating (either orally or via tube) and/or has been taking other orally administered medications for a least 24 hours ?The patient is improving clinically and has a Tmax < 100.5 ? ?If you have questions about this conversion, please contact the Pharmacy Department  ?'[]'$   [phone] )  [facility] ?'[]'$   [phone] )  [facility]  ?'[]'$   [phone] )  [facility] ?'[]'$   [phone] )  [facility]  ? ?Irving Bloor Rodriguez-Guzman PharmD, BCPS ?11/16/2021 12:18 PM ? ?

## 2021-11-16 NOTE — Evaluation (Signed)
Physical Therapy Evaluation ?Patient Details ?Name: [name] ?MRN: [id number] ?DOB: 1933/08/10 ?Today's Date: 11/16/2021 ? ?History of Present Illness ? 88 year old female with medical history significant for CAD, HTN, COPD, chronic respiratory failure with hypoxia on 4 L/min nasal cannula oxygen, rheumatoid arthritis, osteoporosis and GERD presented to the ED with complaints of dyspnea.  She reportedly saw her MD for left hip pain and was diagnosed with a "crack" in the hip bone and prescribed some meds.  Following taking the meds prescribed, she started experiencing worsening dyspnea.  This was associated with productive cough without fever or chest pain.  Home DuoNebs did not offer relief.  Admitted for acute on chronic respiratory failure with hypoxia due to suspected COPD exacerbation, community-acquired pneumonia.  ?Clinical Impression ? Pt pleasant and motivated to try and get up with PT but struggled with maintaining appropriate SpO2 even on 5L with minimal activity.  She has chronically been on O2, currently 4L day and night.  She is on 5L here with supine resting sats ~90%, she needed min assist with transition from/to supine but did not seem overly fatigued with the effort.  Pt was pleased to get to EOB/off her back but struggled with low O2 t/o the >5 minutes of sitting.  Pt initially with sats dropping to the 70s and even with focused breathing she did not manage to get SpO2 >85%.  She showed ability to do a few light exercises sitting at EOB but had drop in O2 even with light activity.  PT confirmed low O2 status with multiple pulse oximeters.  Pt hoping to go home but will need to show ability to do at least in-home distances.  Suggesting STR at this point with the hopes that she improves enough to go home.     ? ?Recommendations for follow up therapy are one component of a multi-disciplinary discharge planning process, led by the attending physician.  Recommendations may be updated based on  patient status, additional functional criteria and insurance authorization. ? ?Follow Up Recommendations Skilled nursing-short term rehab (<3 hours/day) ? ?  ?Assistance Recommended at Discharge Set up Supervision/Assistance  ?Patient can return home with the following ? A little help with walking and/or transfers;A little help with bathing/dressing/bathroom;Assistance with cooking/housework;Assist for transportation;Help with stairs or ramp for entrance ? ?  ?Equipment Recommendations None recommended by PT  ?Recommendations for Other Services ?    ?  ?Functional Status Assessment Patient has had a recent decline in their functional status and demonstrates the ability to make significant improvements in function in a reasonable and predictable amount of time.  ? ?  ?Precautions / Restrictions Precautions ?Precautions: Fall;None ?Restrictions ?Weight Bearing Restrictions: No  ? ?  ? ?Mobility ? Bed Mobility ?Overal bed mobility: Needs Assistance ?Bed Mobility: Supine to Sit, Sit to Supine ?  ?  ?Supine to sit: Min assist ?Sit to supine: Mod assist ?  ?General bed mobility comments: Pt made attempt to get up from and back to supine, however needed assist with all transitions.  Pt was able to maintain sitting EOB for ~8 minutes, but even on 5L O2 struggled to maintain sats in the mid 80s. ?  ? ?Transfers ?  ?  ?  ?  ?  ?  ?  ?  ?  ?General transfer comment: deferred standing 2/2 O2 saturations with activity. ?  ? ?Ambulation/Gait ?  ?  ?  ?  ?  ?  ?  ?  ? ?Stairs ?  ?  ?  ?  ?  ? ?  Wheelchair Mobility ?  ? ?Modified Rankin (Stroke Patients Only) ?  ? ?  ? ?Balance Overall balance assessment: Needs assistance ?Sitting-balance support: Single extremity supported ?Sitting balance-Leahy Scale: Good ?Sitting balance - Comments: Pt reports enjoying sitting at EOB but had consistently low SpO2 readings (down to 70s and struggling to get above 85% even with focused breathing and motivation to try and improve this. ?  ?  ?   ?Standing balance comment: deferred secondary to low SpO2 during static sitting ?  ?  ?  ?  ?  ?  ?  ?  ?  ?  ?  ?   ? ? ? ?Pertinent Vitals/Pain Pain Assessment ?Pain Assessment: No/denies pain  ? ? ?Home Living Family/patient expects to be discharged to:: Unsure ?Living Arrangements: Spouse/significant other ?  ?  ?  ?  ?  ?  ?  ?  ?Additional Comments: 4 L02 at baseline, 3 steps with rails  ?  ?Prior Function Prior Level of Function : Needs assist ?  ?  ?  ?  ?  ?  ?Mobility Comments: Pt's husband helps donning socks, bed mobility.  She gets to/from bathroom and bath w/o assist - always using walker.  Out of the home maybe 1x/wk, does not drive ?  ?  ? ? ?Hand Dominance  ?   ? ?  ?Extremity/Trunk Assessment  ? Upper Extremity Assessment ?Upper Extremity Assessment: Overall WFL for tasks assessed;Generalized weakness ?  ? ?Lower Extremity Assessment ?Lower Extremity Assessment: Overall WFL for tasks assessed;Generalized weakness ?  ? ?   ?Communication  ? Communication: No difficulties  ?Cognition Arousal/Alertness: Awake/alert ?Behavior During Therapy: [facility] for tasks assessed/performed ?Overall Cognitive Status: Within Functional Limits for tasks assessed ?  ?  ?  ?  ?  ?  ?  ?  ?  ?  ?  ?  ?  ?  ?  ?  ?  ?  ?  ? ?  ?General Comments General comments (skin integrity, edema, etc.): Pt motivated to do some activity but even on 5L, she was unable to get O2 >85%.  Used hand held and wheeled dynamap to confirm low readings and they were reading congruently at these low levels. ? ?  ?Exercises    ? ?Assessment/Plan  ?  ?PT Assessment Patient needs continued PT services  ?PT Problem List Decreased strength;Decreased range of motion;Decreased activity tolerance ? ?   ?  ?PT Treatment Interventions DME instruction;Gait training;Functional mobility training;Stair training;Therapeutic activities;Therapeutic exercise;Balance training;Neuromuscular re-education;Patient/family education   ? ?PT Goals (Current goals can be  found in the Care Plan section)  ?Acute Rehab PT Goals ?Patient Stated Goal: pt very much wants to go home ?PT Goal Formulation: With patient ?Time For Goal Achievement: 11/30/21 ?Potential to Achieve Goals: Fair ? ?  ?Frequency Min 2X/week ?  ? ? ?Co-evaluation   ?  ?  ?  ?  ? ? ?  ?AM-PAC PT "6 Clicks" Mobility  ?Outcome Measure Help needed turning from your back to your side while in a flat bed without using bedrails?: A Little ?Help needed moving from lying on your back to sitting on the side of a flat bed without using bedrails?: A Little ?Help needed moving to and from a bed to a chair (including a wheelchair)?: A Little ?Help needed standing up from a chair using your arms (e.g., wheelchair or bedside chair)?: A Lot ?Help needed to walk in hospital room?: A Lot ?Help needed climbing 3-5 steps  with a railing? : Total ?6 Click Score: 14 ? ?  ?End of Session   ?Activity Tolerance: Patient limited by fatigue ?Patient left: with bed alarm set;with call bell/phone within reach;with family/visitor present ?Nurse Communication:  (O2 drop with minimal activity) ?PT Visit Diagnosis: Muscle weakness (generalized) (M62.81);Difficulty in walking, not elsewhere classified (R26.2) ?  ? ?Time: 2883-3744 ?PT Time Calculation (min) (ACUTE ONLY): 27 min ? ? ?Charges:   PT Evaluation ?$PT Eval Low Complexity: 1 Low ?PT Treatments ?$Therapeutic Activity: 8-22 mins ?  ?   ? ? ?Kreg Shropshire, DPT ?11/16/2021, 1:06 PM ? ?

## 2021-11-17 DIAGNOSIS — E876 Hypokalemia: Secondary | ICD-10-CM

## 2021-11-17 DIAGNOSIS — J441 Chronic obstructive pulmonary disease with (acute) exacerbation: Secondary | ICD-10-CM

## 2021-11-17 LAB — CBC
HCT: 29.9 % — ABNORMAL LOW (ref 36.0–46.0)
Hemoglobin: 9.3 g/dL — ABNORMAL LOW (ref 12.0–15.0)
MCH: 26 pg (ref 26.0–34.0)
MCHC: 31.1 g/dL (ref 30.0–36.0)
MCV: 83.5 fL (ref 80.0–100.0)
Platelets: 365 10*3/uL (ref 150–400)
RBC: 3.58 MIL/uL — ABNORMAL LOW (ref 3.87–5.11)
RDW: 14.6 % (ref 11.5–15.5)
WBC: 9.8 10*3/uL (ref 4.0–10.5)
nRBC: 0 % (ref 0.0–0.2)

## 2021-11-17 LAB — BASIC METABOLIC PANEL
Anion gap: 7 (ref 5–15)
BUN: 35 mg/dL — ABNORMAL HIGH (ref 8–23)
CO2: 26 mmol/L (ref 22–32)
Calcium: 8.6 mg/dL — ABNORMAL LOW (ref 8.9–10.3)
Chloride: 104 mmol/L (ref 98–111)
Creatinine, Ser: 0.82 mg/dL (ref 0.44–1.00)
GFR, Estimated: >60 mL/min (ref >60)
Glucose, Bld: 118 mg/dL — ABNORMAL HIGH (ref 70–99)
Potassium: 3.1 mmol/L — ABNORMAL LOW (ref 3.5–5.1)
Sodium: 137 mmol/L (ref 135–145)

## 2021-11-17 LAB — GLUCOSE, CAPILLARY
Glucose-Capillary: 110 mg/dL — ABNORMAL HIGH (ref 70–99)
Glucose-Capillary: 107 mg/dL — ABNORMAL HIGH (ref 70–99)
Glucose-Capillary: 122 mg/dL — ABNORMAL HIGH (ref 70–99)
Glucose-Capillary: 162 mg/dL — ABNORMAL HIGH (ref 70–99)

## 2021-11-17 LAB — RHEUMATOID FACTOR: Rheumatoid fact SerPl-aCnc: 91.8 IU/mL — ABNORMAL HIGH (ref <14.0)

## 2021-11-17 MED ORDER — POTASSIUM CHLORIDE CRYS ER 20 MEQ PO TBCR
40.0000 meq | EXTENDED_RELEASE_TABLET | ORAL | Status: AC
  Administered 2021-11-17 (×2): 40 meq via ORAL
  Filled 2021-11-17 (×2): qty 2

## 2021-11-17 MED ORDER — LISINOPRIL 10 MG PO TABS
10.0000 mg | ORAL_TABLET | Freq: Every day | ORAL | Status: DC
  Administered 2021-11-17 – 2021-11-18 (×2): 10 mg via ORAL
  Filled 2021-11-17 (×2): qty 1

## 2021-11-17 MED ORDER — METHYLPREDNISOLONE SODIUM SUCC 125 MG IJ SOLR: 120.0000 mg | INTRAMUSCULAR | Status: DC

## 2021-11-17 MED ORDER — METHYLPREDNISOLONE SODIUM SUCC 125 MG IJ SOLR
80.0000 mg | INTRAMUSCULAR | Status: DC
  Administered 2021-11-18: 80 mg via INTRAVENOUS
  Filled 2021-11-17: qty 2

## 2021-11-17 MED ORDER — METOPROLOL TARTRATE 25 MG PO TABS
25.0000 mg | ORAL_TABLET | Freq: Three times a day (TID) | ORAL | Status: DC
Start: 2021-11-17 — End: 2021-11-18
  Administered 2021-11-17 – 2021-11-18 (×3): 25 mg via ORAL
  Filled 2021-11-17 (×3): qty 1

## 2021-11-17 MED ORDER — ACETYLCYSTEINE 20 % IN SOLN
3.0000 mL | Freq: Three times a day (TID) | RESPIRATORY_TRACT | Status: DC
  Administered 2021-11-17 – 2021-11-18 (×4): 3 mL via RESPIRATORY_TRACT
  Filled 2021-11-17 (×5): qty 4

## 2021-11-17 NOTE — Care Management Important Message (Signed)
Important Message ? ?Patient Details  ?Name: [name] ?MRN: [id number] ?Date of Birth: [birth year] ? ? ?Medicare Important Message Given:  Yes ? ? ? ? ?Dannette Barbara ?11/17/2021, 12:44 PM ?

## 2021-11-17 NOTE — Plan of Care (Signed)

## 2021-11-17 NOTE — Assessment & Plan Note (Addendum)
Hypomagnesemia Potassium 3.1, magnesium 1.6 Replace aggressively and follow in a.m.

## 2021-11-17 NOTE — Progress Notes (Addendum)
PROGRESS NOTE   [name]  WUJ:[id number]    DOB: [birth year]    DOA: 11/14/2021  PCP: [name], MD   I have briefly reviewed patients previous medical records in [facility].  Chief Complaint  Patient presents with   Shortness of Breath    Hospital Course:  88 year old female with medical history significant for CAD, HTN, COPD, chronic respiratory failure with hypoxia on 4 L/min nasal cannula oxygen, rheumatoid arthritis, osteoporosis and GERD presented to the ED with complaints of dyspnea.  She reportedly saw her MD for left hip pain and was diagnosed with a crack in the hip bone and prescribed some meds.  Following taking the meds prescribed, she started experiencing worsening dyspnea.  This was associated with productive cough without fever or chest pain.  Home DuoNebs did not offer relief.  She was directed to the ED.  Admitted for acute on chronic respiratory failure with hypoxia due to suspected COPD exacerbation, community-acquired pneumonia.  Needed BiPAP overnight of admission.  PCCM consulted.  Has improved but still significantly hypoxic on minimal exertion today with PT.  Patient and spouse declined SNF recommended by therapies and insisted on going home.   Assessment & Plan:  Principal Problem:   Acute and chronic respiratory failure with hypoxia (HCC) Active Problems:   COPD with acute exacerbation (HCC)   CAP (community acquired pneumonia)   Elevated troponin   AKI (acute kidney injury) ([place])   Left hip pain   Essential hypertension   Rheumatoid arthritis (HCC)   Normocytic anemia   Hypokalemia   Assessment and Plan: * Acute and chronic respiratory failure with hypoxia (HCC) Suspected due to COPD exacerbation and pneumonia. Needed BiPAP overnight of admission Pulmonology consulted and input appreciated.  Suspect COPD exacerbation versus viral LRTI versus atypical pneumonia versus decompensated CHF. Follow-up extensive evaluation ordered by  pulmonology.  RVP panel negative.  ESR 73 and CRP 27.7 significantly elevated. Rheumatoid factor 91.8?  Significance.  Serum Fungitell is pending BNP 826 but down from 1878, 7 months prior.  Clinically does not look volume overloaded. Procalcitonin 27.29 > 13.73, already on antibiotics. Supportive care with incentive spirometry. Reassess home oxygen requirement prior to discharge. Due to elevated D-dimer and concern for PE, obtained CTA chest last night, negative for PE. While working with PT today, desaturated even just sitting up EOB, dropped to 70s on 5 L and was increased to 6 L/min. As communicated with pulmonology, planning on nebulized Mucomyst 3 times daily followed by MetaNeb with saline to address left base consolidation, repeating chest x-ray tomorrow.  COPD with acute exacerbation ([place]) Continue antibiotic treatment for pneumonia. Reduced IV Solu-Medrol from 120 mg every 12 hours to 120 mg daily. PCCM following.  CAP (community acquired pneumonia) Continue empirically started IV ceftriaxone and azithromycin Supportive care Follow extensive evaluation recommended by pulmonology. CTA chest 3/8 confirmed new left lower lobe infiltrate with associated small effusion. Day 4/5 antibiotics.  Elevated troponin Chest pain CAD Demand ischemia Chronic diastolic CHF PSVT Cardiology input appreciated and suspect demand ischemia in the context of underlying acute respiratory failure and infectious etiology. Clinically euvolemic. Now with mostly musculoskeletal/reproducible chest pain, better than yesterday. 2D echo: LVEF 55-60%, no regional wall motion abnormalities and grade 1 diastolic dysfunction. Started on metoprolol for SVT. Cardiology signed off 3/10  AKI (acute kidney injury) ([place]) Presented with creatinine of 1.5.  Resolved.  Left hip pain Patient evaluated as outpatient and told to have some chronic, exact details not available.  Has not complained of pain  here. Outpatient follow-up.  Hypokalemia Replace and follow.  Normocytic anemia Hemoglobin has dropped from 10 to 8 g in the absence of overt bleeding. Hemoglobin now up to 9.3.  Rheumatoid arthritis ([place]) Reportedly in remission. Rheumatoid factor elevated, unclear significance.  Also has elevated ESR CRP.  Essential hypertension Holding lisinopril and HCTZ due to elevated creatinine. Now on metoprolol.  Mild fluctuating BPs.   Body mass index is 21.74 kg/m.  Nutritional Status        Pressure Ulcer:     DVT prophylaxis: heparin injection 5,000 Units Start: 11/16/21 0600 SCDs Start: 11/15/21 2130 SCDs Start: 11/14/21 2031     Code Status: Full Code:  Family Communication: Spouse at bedside. Disposition:  Status is: Inpatient Remains inpatient appropriate because: Severity of illness, IV meds    Consultants:   Pulmonology Cardiology  Procedures:     Antimicrobials:   As above   Subjective:  Seen along with spouse at bedside.  Overall feeling better and anxious to return home.  Declined SNF.  Chest pain significantly better.  Occasional and intermittent and mild.  Dyspnea improved but unable to say how much.  Objective:   Vitals:   11/17/21 0557 11/17/21 0734 11/17/21 1150 11/17/21 1446  BP: (!) 161/72 (!) 149/72 (!) 176/80 (!) 171/73  Pulse: 77 71 66 70  Resp:  _0 Temp: 98.5 F (36.9 C) 97.9 F (36.6 C) 98.3 F (36.8 C) 97.9 F (36.6 C)  TempSrc: Oral Oral Oral Oral  SpO2: 91% 91% 98% 93%  Weight:      Height:        General exam: Elderly female, small built and frail, chronically ill looking, lying propped up in bed without distress. Respiratory system: Occasional left basal crackles but otherwise clear to auscultation without wheezing or rhonchi.  No increased work of breathing.  Able to speak in full sentences. Cardiovascular system: S1 & S2 heard, RRR. No JVD, murmurs, rubs, gallops or clicks. No pedal edema.  Telemetry personally  reviewed: Sinus rhythm.  Has had multiple episodes of few minutes of SVT but the longest one was about 30 minutes. Gastrointestinal system: Abdomen is nondistended, soft and nontender. No organomegaly or masses felt. Normal bowel sounds heard. Central nervous system: Alert and oriented. No focal neurological deficits. Extremities: Symmetric 5 x 5 power. Skin: No rashes, lesions or ulcers Psychiatry: Judgement and insight appear normal. Mood & affect appropriate.     Data Reviewed:   I have personally reviewed following labs and imaging studies   CBC: Recent Labs  Lab 11/15/21 0418 11/16/21 0440 11/17/21 1008  WBC 14.4* 11.2* 9.8  HGB 8.0* 8.2* 9.3*  HCT 25.1* 26.6* 29.9*  MCV 83.1 84.7 83.5  PLT 302 314 165    Basic Metabolic Panel: Recent Labs  Lab 11/14/21 1545 11/15/21 0418 11/16/21 0440 11/17/21 1008  NA 134* 135 140 137  K 3.5 3.2* 3.7 3.1*  CL 96* 100 107 104  CO2 _1 GLUCOSE 156* 174* 105* 118*  BUN 58* 45* 34* 35*  CREATININE 1.50* 1.02* 0.83 0.82  CALCIUM 9.2 8.2* 8.5* 8.6*  MG  --  2.0  --   --     Liver Function Tests: Recent Labs  Lab 11/14/21 1545 11/15/21 0418  AST 35 23  ALT 17 13  ALKPHOS 66 54  BILITOT 0.6 0.3  PROT 7.4 5.9*  ALBUMIN 3.6 2.8*    CBG: Recent Labs  Lab  11/16/21 2038 11/17/21 0733 11/17/21 1203  GLUCAP 149* 110* 107*    Microbiology Studies:   Recent Results (from the past 240 hour(s))  Blood culture (routine x 2)     Status: None (Preliminary result)   Collection Time: 11/14/21  3:35 PM   Specimen: BLOOD  Result Value Ref Range Status   Specimen Description BLOOD BLRA  Final   Special Requests BOTTLES DRAWN AEROBIC AND ANAEROBIC BCAV  Final   Culture   Final    NO GROWTH 3 DAYS Performed at [facility], [address] [place], [place] [address]    Report Status PENDING  Incomplete  Blood culture (routine x 2)     Status: None (Preliminary result)   Collection Time: 11/14/21  3:40 PM    Specimen: BLOOD  Result Value Ref Range Status   Specimen Description BLOOD RAC  Final   Special Requests BOTTLES DRAWN AEROBIC AND ANAEROBIC BCAV  Final   Culture   Final    NO GROWTH 3 DAYS Performed at [facility], [address], [place], [place] [address]    Report Status PENDING  Incomplete  Resp Panel by RT-PCR (Flu A&B, Covid) Nasopharyngeal Swab     Status: None   Collection Time: 11/14/21  3:45 PM   Specimen: Nasopharyngeal Swab; Nasopharyngeal(NP) swabs in vial transport medium  Result Value Ref Range Status   SARS Coronavirus 2 by RT PCR NEGATIVE NEGATIVE Final    Comment: (NOTE) SARS-CoV-2 target nucleic acids are NOT DETECTED.  The SARS-CoV-2 RNA is generally detectable in upper respiratory specimens during the acute phase of infection. The lowest concentration of SARS-CoV-2 viral copies this assay can detect is 138 copies/mL. A negative result does not preclude SARS-Cov-2 infection and should not be used as the sole basis for treatment or other patient management decisions. A negative result may occur with  improper specimen collection/handling, submission of specimen other than nasopharyngeal swab, presence of viral mutation(s) within the areas targeted by this assay, and inadequate number of viral copies(<138 copies/mL). A negative result must be combined with clinical observations, patient history, and epidemiological information. The expected result is Negative.  Fact Sheet for Patients:  EntrepreneurPulse.com.au  Fact Sheet for Healthcare Providers:  IncredibleEmployment.be  This test is no t yet approved or cleared by the [place] FDA and  has been authorized for detection and/or diagnosis of SARS-CoV-2 by FDA under an Emergency Use Authorization (EUA). This EUA will remain  in effect (meaning this test can be used) for the duration of the COVID-19 declaration under Section 564(b)(1) of the Act,  21 U.S.C.section 360bbb-3(b)(1), unless the authorization is terminated  or revoked sooner.       Influenza A by PCR NEGATIVE NEGATIVE Final   Influenza B by PCR NEGATIVE NEGATIVE Final    Comment: (NOTE) The Xpert Xpress SARS-CoV-2/FLU/RSV plus assay is intended as an aid in the diagnosis of influenza from Nasopharyngeal swab specimens and should not be used as a sole basis for treatment. Nasal washings and aspirates are unacceptable for Xpert Xpress SARS-CoV-2/FLU/RSV testing.  Fact Sheet for Patients: EntrepreneurPulse.com.au  Fact Sheet for Healthcare Providers: IncredibleEmployment.be  This test is not yet approved or cleared by the [place] FDA and has been authorized for detection and/or diagnosis of SARS-CoV-2 by FDA under an Emergency Use Authorization (EUA). This EUA will remain in effect (meaning this test can be used) for the duration of the COVID-19 declaration under Section 564(b)(1) of the Act, 21 U.S.C. section 360bbb-3(b)(1),  unless the authorization is terminated or revoked.  Performed at [facility], [place], [place] [address]   Respiratory (~20 pathogens) panel by PCR     Status: None   Collection Time: 11/15/21  5:30 PM   Specimen: Nasopharyngeal Swab; Respiratory  Result Value Ref Range Status   Adenovirus NOT DETECTED NOT DETECTED Final   Coronavirus 229E NOT DETECTED NOT DETECTED Final    Comment: (NOTE) The Coronavirus on the Respiratory Panel, DOES NOT test for the novel  Coronavirus (2019 nCoV)    Coronavirus HKU1 NOT DETECTED NOT DETECTED Final   Coronavirus NL63 NOT DETECTED NOT DETECTED Final   Coronavirus OC43 NOT DETECTED NOT DETECTED Final   Metapneumovirus NOT DETECTED NOT DETECTED Final   Rhinovirus / Enterovirus NOT DETECTED NOT DETECTED Final   Influenza A NOT DETECTED NOT DETECTED Final   Influenza B NOT DETECTED NOT DETECTED Final   Parainfluenza Virus 1 NOT  DETECTED NOT DETECTED Final   Parainfluenza Virus 2 NOT DETECTED NOT DETECTED Final   Parainfluenza Virus 3 NOT DETECTED NOT DETECTED Final   Parainfluenza Virus 4 NOT DETECTED NOT DETECTED Final   Respiratory Syncytial Virus NOT DETECTED NOT DETECTED Final   Bordetella pertussis NOT DETECTED NOT DETECTED Final   Bordetella Parapertussis NOT DETECTED NOT DETECTED Final   Chlamydophila pneumoniae NOT DETECTED NOT DETECTED Final   Mycoplasma pneumoniae NOT DETECTED NOT DETECTED Final    Comment: Performed at [facility], [place]. [address] [place], [place] [address]    Radiology Studies:  CT Angio Chest Pulmonary Embolism (PE) W or WO Contrast  Result Date: 11/15/2021 CLINICAL DATA:  Chronic respiratory failure EXAM: CT ANGIOGRAPHY CHEST WITH CONTRAST TECHNIQUE: Multidetector CT imaging of the chest was performed using the standard protocol during bolus administration of intravenous contrast. Multiplanar CT image reconstructions and MIPs were obtained to evaluate the vascular anatomy. RADIATION DOSE REDUCTION: This exam was performed according to the departmental dose-optimization program which includes automated exposure control, adjustment of the mA and/or kV according to patient size and/or use of iterative reconstruction technique. CONTRAST:  33m OMNIPAQUE IOHEXOL 300 MG/ML  SOLN COMPARISON:  04/11/2021 FINDINGS: Cardiovascular: Atherosclerotic calcifications of the thoracic aorta are noted. No aneurysmal dilatation or dissection is noted. Mild cardiac enlargement is seen. No pericardial effusion is seen. Coronary calcifications are noted. Pulmonary artery shows a normal branching pattern bilaterally. No intraluminal filling defect is identified. Mediastinum/Nodes: Thoracic inlet is within normal limits. No sizable hilar or mediastinal adenopathy is noted. The esophagus as visualized is within normal limits. Lungs/Pleura: Diffuse emphysematous changes are noted in the lungs bilaterally stable in  appearance from the prior exam. Small effusion is noted on the left with lower lobe infiltrate new from the prior study. No sizable parenchymal nodules are seen. Upper Abdomen: Visualized upper abdomen is within normal limits. Musculoskeletal: Degenerative changes of the thoracic spine are noted. No definitive rib abnormality is seen. Bilateral breast implants are seen with evidence of stable intracapsular rupture similar to that noted on the prior exam. Review of the MIP images confirms the above findings. IMPRESSION: No evidence of pulmonary emboli. New left lower lobe infiltrate with associated small effusion. Aortic Atherosclerosis (ICD10-I70.0) and Emphysema (ICD10-J43.9). Electronically Signed   By: MInez CatalinaM.D.   On: 11/15/2021 21:21    Scheduled Meds:    acetylcysteine  3 mL Nebulization TID   aspirin EC  81 mg Oral Daily   azithromycin  500 mg Oral QPM   citalopram  20 mg Oral Daily   fluticasone furoate-vilanterol  1 puff Inhalation Daily   And   umeclidinium bromide  1 puff Inhalation Daily   heparin  5,000 Units Subcutaneous Q8H   insulin aspart  0-6 Units Subcutaneous TID WC   lisinopril  10 mg Oral Daily   [START ON 11/18/2021] methylPREDNISolone (SOLU-MEDROL) injection  120 mg Intravenous Q24H   metoprolol tartrate  25 mg Oral TID   montelukast  10 mg Oral QHS   pantoprazole  40 mg Oral Daily   potassium chloride  40 mEq Oral Q4H   sucralfate  1 g Oral QID    Continuous Infusions:    cefTRIAXone (ROCEPHIN)  IV Stopped (11/16/21 1842)     LOS: 3 days     Vernell Leep, MD,  FACP, [facility], [facility], [facility] (Care Management Physician Certified) [place]  To contact the attending provider between 7A-7P or the covering provider during after hours 7P-7A, please log into the web site [email or web address] and access using universal [place] password for that web site. If you do not have the password, please call the hospital  operator.  11/17/2021, 3:41 PM

## 2021-11-17 NOTE — Progress Notes (Signed)
Pt does not want to be woke up for 0600 CPT. ?

## 2021-11-17 NOTE — Progress Notes (Addendum)
[facility] NOTE       Patient ID: [name] MRN: [id number] DOB/AGE: 1932/12/02 88 y.o.  Admit date: 11/14/2021 Referring Physician Dr. Algis Liming Primary Physician Lloyd Huger  Primary Cardiologist in [place] (last seen 1 year ago)  Reason for Consultation atypical chest pain   HPI: The patient is an 88 year old female with a past medical history notable for HFpEF (LVEF 55-60% with myxomatous MV, mild AR with g3DD), history of MI 1999, COPD on 4 L at baseline, hypertension, rheumatoid arthritis who presented to [facility] ED with shortness of breath and productive cough for 2 to 3 days.  She presented hypoxic to the 70s and severely dyspneic requiring BiPAP initially.  Cardiology was consulted because of her elevated troponin and chest pain.  Interval History: -has mild reproducible and pleuritic chest pain this morning -laying flat in bed. Still feels her breathing is not a baseline.  -multiple periods of tachycardia (svt vs a tach) on tele with HR peak 130s, longest 20 minutes   Review of systems complete and found to be negative unless listed above     Past Medical History:  Diagnosis Date   Anxiety    Asthma    CHF (congestive heart failure) (HCC)    COPD (chronic obstructive pulmonary disease) ([place])    Hypertension    Melanoma ([place])    scalp (2007) & leg (2013)   Myocardial infarction ([place]) 09/10/1997   no PCI or CABG   Osteoporosis    Rheumatoid arthritis ([place])    SVT (supraventricular tachycardia) ([place])     Past Surgical History:  Procedure Laterality Date   ABDOMINAL HYSTERECTOMY     BREAST SURGERY     CARDIAC SURGERY     CARPAL TUNNEL RELEASE     MELANOMA EXCISION  2007   scalp   MELANOMA EXCISION Right 2013   lower leg   SHOULDER SURGERY     TOTAL HIP REVISION  2015   VIDEO BRONCHOSCOPY Bilateral 04/08/2014   Procedure: VIDEO BRONCHOSCOPY WITHOUT FLUORO;  Surgeon: Juanito Doom, MD;  Location[facility] ENDOSCOPY;  Service:  Cardiopulmonary;  Laterality: Bilateral;    Medications Prior to Admission  Medication Sig Dispense Refill Last Dose   acetaminophen (TYLENOL) 325 MG tablet Take 650 mg by mouth every 6 (six) hours as needed.   11/14/2021   albuterol (PROVENTIL) (2.5 MG/3ML) 0.083% nebulizer solution Inhale 3 mLs into the lungs every 6 (six) hours as needed.   11/14/2021   Cholecalciferol (VITAMIN D PO) Take 25 mcg by mouth daily.   11/14/2021   citalopram (CELEXA) 20 MG tablet Take 20 mg by mouth daily.   11/14/2021   fish oil-omega-3 fatty acids 1000 MG capsule Take 2 g by mouth daily.   11/14/2021   hydrochlorothiazide (HYDRODIURIL) 12.5 MG tablet Take 12.5 mg by mouth daily.   11/14/2021   ipratropium-albuterol (DUONEB) 0.5-2.5 (3) MG/3ML SOLN Take 3 mLs by nebulization every 6 (six) hours as needed.   11/13/2021   lisinopril (ZESTRIL) 40 MG tablet Take 40 mg by mouth daily.   11/14/2021   montelukast (SINGULAIR) 10 MG tablet Take 10 mg by mouth at bedtime.   11/13/2021   omeprazole (PRILOSEC) 20 MG capsule Take 20 mg by mouth 2 (two) times daily before a meal.   11/14/2021   sucralfate (CARAFATE) 1 g tablet Take 1 g by mouth 4 (four) times daily.   11/13/2021   traMADol (ULTRAM) 50 MG tablet Take 50 mg by mouth 2 (two) times  daily as needed.   11/13/2021   TRELEGY ELLIPTA 100-62.5-25 MCG/INH AEPB Take 1 puff by mouth daily.   11/14/2021   acetaminophen-codeine (TYLENOL #3) 300-30 MG tablet Take 1 tablet by mouth every 4 (four) hours as needed. (Patient not taking: Reported on 04/20/2021)      alendronate (FOSAMAX) 70 MG tablet Take 70 mg by mouth once a week.   11/10/2021   bisoprolol (ZEBETA) 5 MG tablet Take 0.5 tablets (2.5 mg total) by mouth at bedtime. (Patient not taking: Reported on 11/14/2021) 30 tablet 0 Not Taking   diltiazem (CARDIZEM CD) 180 MG 24 hr capsule Take 1 capsule (180 mg total) by mouth daily. (Patient not taking: Reported on 11/14/2021) 30 capsule 0 Not Taking   furosemide (LASIX) 20 MG tablet Take 1 tablet (20 mg  total) by mouth daily. (Patient not taking: Reported on 11/14/2021) 30 tablet 0 Not Taking   nitroGLYCERIN (NITROSTAT) 0.4 MG SL tablet Place 1 tablet (0.4 mg total) under the tongue every 5 (five) minutes as needed for chest pain. 30 tablet 3 prn   potassium chloride SA (KLOR-CON) 20 MEQ tablet Take 1 tablet (20 mEq total) by mouth daily. (Patient not taking: Reported on 11/14/2021) 30 tablet 0 Not Taking   tizanidine (ZANAFLEX) 2 MG capsule Take 2 mg by mouth 3 (three) times daily. (Patient not taking: Reported on 11/14/2021)   Not Taking    Social History   Socioeconomic History   Marital status: Married    Spouse name: Not on file   Number of children: Not on file   Years of education: Not on file   Highest education level: Not on file  Occupational History   Not on file  Tobacco Use   Smoking status: Former    Packs/day: 1.50    Years: 22.00    Pack years: 33.00    Types: Cigarettes    Quit date: 04/07/1974    Years since quitting: 47.6   Smokeless tobacco: Never  Substance and Sexual Activity   Alcohol use: No   Drug use: No   Sexual activity: Never  Other Topics Concern   Not on file  Social History Narrative   Lives in [place].  Originally from [place].  Children live in [place].   Social Determinants of Health   Financial Resource Strain: Not on file  Food Insecurity: Not on file  Transportation Needs: Not on file  Physical Activity: Not on file  Stress: Not on file  Social Connections: Not on file  Intimate Partner Violence: Not on file    History reviewed. No pertinent family history.    Review of systems complete and found to be negative unless listed above    PHYSICAL EXAM General: Elderly appearing Caucasian female, well nourished, in no acute distress.  Sitting upright in PCU bed. HEENT:  Normocephalic and atraumatic. Neck:  No JVD.  Lungs: Normal work of breathing on O2 by nasal cannula.  Poor air movement, coarse breath sounds, no wheezes Heart:  HRRR . Normal S1 and S2.  3/6 systolic murmur heard at the RUSB and apex  Chest: anterior chest wall with tenderness to palpation Abdomen: Non-distended appearing.  Msk: Normal strength and tone for age. Extremities: Chronic skin discoloration in bilateral lower extremities likely 2/2 arterial insufficiency.  No edema. Neuro: Alert and oriented X 3. Psych:  Answers questions appropriately.   Labs:   Lab Results  Component Value Date   WBC 9.8 11/17/2021   HGB 9.3 (L) 11/17/2021  HCT 29.9 (L) 11/17/2021   MCV 83.5 11/17/2021   PLT 365 11/17/2021    Recent Labs  Lab 11/15/21 0418 11/16/21 0440 11/17/21 1008  NA 135   < > 137  K 3.2*   < > 3.1*  CL 100   < > 104  CO2 27   < > 26  BUN 45*   < > 35*  CREATININE 1.02*   < > 0.82  CALCIUM 8.2*   < > 8.6*  PROT 5.9*  --   --   BILITOT 0.3  --   --   ALKPHOS 54  --   --   ALT 13  --   --   AST 23  --   --   GLUCOSE 174*   < > 118*   < > = values in this interval not displayed.    Lab Results  Component Value Date   TROPONINI <0.30 02/23/2013    No results found for: CHOL No results found for: HDL No results found for: LDLCALC No results found for: TRIG No results found for: CHOLHDL No results found for: LDLDIRECT    Radiology: CT Angio Chest Pulmonary Embolism (PE) W or WO Contrast  Result Date: 11/15/2021 CLINICAL DATA:  Chronic respiratory failure EXAM: CT ANGIOGRAPHY CHEST WITH CONTRAST TECHNIQUE: Multidetector CT imaging of the chest was performed using the standard protocol during bolus administration of intravenous contrast. Multiplanar CT image reconstructions and MIPs were obtained to evaluate the vascular anatomy. RADIATION DOSE REDUCTION: This exam was performed according to the departmental dose-optimization program which includes automated exposure control, adjustment of the mA and/or kV according to patient size and/or use of iterative reconstruction technique. CONTRAST:  51m OMNIPAQUE IOHEXOL 300 MG/ML  SOLN  COMPARISON:  04/11/2021 FINDINGS: Cardiovascular: Atherosclerotic calcifications of the thoracic aorta are noted. No aneurysmal dilatation or dissection is noted. Mild cardiac enlargement is seen. No pericardial effusion is seen. Coronary calcifications are noted. Pulmonary artery shows a normal branching pattern bilaterally. No intraluminal filling defect is identified. Mediastinum/Nodes: Thoracic inlet is within normal limits. No sizable hilar or mediastinal adenopathy is noted. The esophagus as visualized is within normal limits. Lungs/Pleura: Diffuse emphysematous changes are noted in the lungs bilaterally stable in appearance from the prior exam. Small effusion is noted on the left with lower lobe infiltrate new from the prior study. No sizable parenchymal nodules are seen. Upper Abdomen: Visualized upper abdomen is within normal limits. Musculoskeletal: Degenerative changes of the thoracic spine are noted. No definitive rib abnormality is seen. Bilateral breast implants are seen with evidence of stable intracapsular rupture similar to that noted on the prior exam. Review of the MIP images confirms the above findings. IMPRESSION: No evidence of pulmonary emboli. New left lower lobe infiltrate with associated small effusion. Aortic Atherosclerosis (ICD10-I70.0) and Emphysema (ICD10-J43.9). Electronically Signed   By: MInez CatalinaM.D.   On: 11/15/2021 21:21   DG Chest Portable 1 View  Result Date: 11/14/2021 CLINICAL DATA:  Respiratory distress. EXAM: PORTABLE CHEST 1 VIEW COMPARISON:  Chest radiograph dated April 13, 2019 FINDINGS: The heart is enlarged. Atherosclerotic calcification of the aortic arch. Left lower lobe hazy opacity with silhouetting of the left costophrenic angle. Bilateral mild pleural/parenchymal scarring, unchanged. IMPRESSION: 1. Stable cardiomegaly. 2. Left lower lobe hazy opacity which may represent atelectasis or effusion. Underlying airspace disease can not be excluded.  Electronically Signed   By: IKeane PoliceD.O.   On: 11/14/2021 15:59   ECHOCARDIOGRAM COMPLETE  Result Date: 11/15/2021  ECHOCARDIOGRAM REPORT   Patient Name:   JAHNAYA BRANSCOME [facility] Date of Exam: 11/15/2021 Medical Rec #:  [id number]        Height:       60.0 in Accession #:    [id number]       Weight:       111.3 lb Date of Birth:  1933/02/22         BSA:          1.455 m Patient Age:    64 years         BP:           161/80 mmHg Patient Gender: F                HR:           93 bpm. Exam Location:  [facility] Procedure: 2D Echo, Cardiac Doppler and Color Doppler Indications:     Chest pain R07.9  History:         Patient has prior history of Echocardiogram examinations, most                  recent 02/23/2021. CHF, Previous Myocardial Infarction, COPD;                  Risk Factors:Hypertension. SVT.  Sonographer:     Sherrie Sport Referring Phys:  [id number] [facility] [place] Diagnosing Phys: Donnelly Angelica  Sonographer Comments: Technically challenging study due to limited acoustic windows. Image acquisition challenging due to breast implants and Image acquisition challenging due to COPD. IMPRESSIONS  1. Left ventricular ejection fraction, by estimation, is 55 to 60%. The left ventricle has normal function. The left ventricle has no regional wall motion abnormalities. Left ventricular diastolic parameters are consistent with Grade I diastolic dysfunction (impaired relaxation).  2. Right ventricular systolic function is normal. The right ventricular size is not well visualized.  3. Left atrial size was moderately dilated.  4. The mitral valve is degenerative. Mild mitral valve regurgitation. No evidence of mitral stenosis. The mean mitral valve gradient is 5.0 mmHg.  5. The aortic valve is calcified. Aortic valve regurgitation is trivial. Aortic valve sclerosis/calcification is present, without any evidence of aortic stenosis. FINDINGS  Left Ventricle: Left ventricular ejection fraction, by estimation, is 55 to 60%. The left  ventricle has normal function. The left ventricle has no regional wall motion abnormalities. The left ventricular internal cavity size was normal in size. There is  no left ventricular hypertrophy. Left ventricular diastolic parameters are consistent with Grade I diastolic dysfunction (impaired relaxation). Right Ventricle: The right ventricular size is not well visualized. Right vetricular wall thickness was not well visualized. Right ventricular systolic function is normal. Left Atrium: Left atrial size was moderately dilated. Right Atrium: Right atrial size was not well visualized. Pericardium: There is no evidence of pericardial effusion. Mitral Valve: The mitral valve is degenerative in appearance. Mild mitral valve regurgitation. No evidence of mitral valve stenosis. MV peak gradient, 9.6 mmHg. The mean mitral valve gradient is 5.0 mmHg. Tricuspid Valve: The tricuspid valve is normal in structure. Tricuspid valve regurgitation is trivial. Aortic Valve: The aortic valve is calcified. Aortic valve regurgitation is trivial. Aortic valve sclerosis/calcification is present, without any evidence of aortic stenosis. Aortic valve mean gradient measures 4.0 mmHg. Aortic valve peak gradient measures 7.7 mmHg. Aortic valve area, by VTI measures 1.97 cm. Pulmonic Valve: The pulmonic valve was not well visualized. Pulmonic valve regurgitation is not visualized. Aorta: The aortic root is normal  in size and structure. IAS/Shunts: The interatrial septum was not well visualized.  LEFT VENTRICLE PLAX 2D LVIDd:         4.30 cm   Diastology LVIDs:         3.10 cm   LV e' medial:    4.03 cm/s LV PW:         1.10 cm   LV E/e' medial:  2.0 LV IVS:        1.10 cm   LV e' lateral:   7.34 cm/s LVOT diam:     2.00 cm   LV E/e' lateral: 1.1 LV SV:         51 LV SV Index:   35 LVOT Area:     3.14 cm  RIGHT VENTRICLE RV S prime:     11.20 cm/s TAPSE (M-mode): 2.0 cm LEFT ATRIUM             Index        RIGHT ATRIUM           Index LA  diam:        3.60 cm 2.47 cm/m   RA Area:     14.90 cm LA Vol (A2C):   72.1 ml 49.55 ml/m  RA Volume:   30.50 ml  20.96 ml/m LA Vol (A4C):   49.3 ml 33.88 ml/m LA Biplane Vol: 59.7 ml 41.02 ml/m  AORTIC VALVE                    PULMONIC VALVE AV Area (Vmax):    2.21 cm     PV Vmax:        0.56 m/s AV Area (Vmean):   1.79 cm     PV Vmean:       38.300 cm/s AV Area (VTI):     1.97 cm     PV VTI:         0.114 m AV Vmax:           139.00 cm/s  PV Peak grad:   1.3 mmHg AV Vmean:          94.400 cm/s  PV Mean grad:   1.0 mmHg AV VTI:            0.257 m      RVOT Peak grad: 6 mmHg AV Peak Grad:      7.7 mmHg AV Mean Grad:      4.0 mmHg LVOT Vmax:         97.90 cm/s LVOT Vmean:        53.700 cm/s LVOT VTI:          0.161 m LVOT/AV VTI ratio: 0.63  AORTA Ao Root diam: 2.90 cm MITRAL VALVE                TRICUSPID VALVE MV Area (PHT): 4.31 cm     TR Peak grad:   10.8 mmHg MV Peak grad:  9.6 mmHg     TR Vmax:        164.00 cm/s MV Mean grad:  5.0 mmHg MV Vmax:       1.55 m/s     SHUNTS MV Vmean:      103.0 cm/s   Systemic VTI:  0.16 m MV Decel Time: 176 msec     Systemic Diam: 2.00 cm MV E velocity: 8.05 cm/s    Pulmonic VTI:  0.252 m MV A velocity: 133.00 cm/s MV E/A ratio:  0.06 Donnelly Angelica Electronically  signed by Donnelly Angelica Signature Date/Time: 11/15/2021/5:13:23 PM    Final     ECHO 02/2021  1. Left ventricular ejection fraction, by estimation, is 55 to 60%. The  left ventricle has normal function. The left ventricle has no regional  wall motion abnormalities. Left ventricular diastolic parameters are  consistent with Grade III diastolic  dysfunction (restrictive).   2. Right ventricular systolic function is normal. The right ventricular  size is normal.   3. The mitral valve is myxomatous. Trivial mitral valve regurgitation.   4. The tricuspid valve is myxomatous. Tricuspid valve regurgitation is  mild to moderate.   5. The aortic valve is grossly normal. Aortic valve regurgitation is  mild. Mild to  moderate aortic valve sclerosis/calcification is present,  without any evidence of aortic stenosis.   TELEMETRY reviewed by me: Normal sinus rhythm with rate 60s-70s, multiple periods of atrial tachycardia vs SVT on tele this morning, longest lasting 27 mins.   EKG reviewed by me: Initial EKG 3/7 at 1544 showed sinus rhythm rate 95 PVCs, LAFB, RBBB repeat EKG 3/8 showed sinus tach rate 137  ASSESSMENT AND PLAN:  The patient is an 88 year old female with a past medical history notable for HFpEF (LVEF 55-60% with myxomatous MV, mild AR with g3DD), history of MI 1999, COPD on 4 L at baseline, hypertension, rheumatoid arthritis who presented to [facility] ED with shortness of breath and productive cough for 2 to 3 days.  She presented hypoxic to the 70s and severely dyspneic requiring BiPAP initially.  Cardiology was consulted because of her elevated troponin and chest pain.  #Acute hypoxic respiratory failure 2/2 CAP/COPD (baseline 4 L) #Atypical chest pain #paroxysmal SVT The patient presents with a 3-day history of "feeling bad" with increased cough and central chest pain that is nonradiating, nonpleuritic, nonexertional and, intermittent and otherwise difficult to characterize by the patient.  The patient notably has a white count of 22, procalcitonin of 27 and hemoglobin decreasing from 10.7-8 overnight.  Her troponins peaked at 175 yesterday evening, likely related to demand ischemia from her bacterial infection and hypoxia and not ACS. She had another episode of chest pain yesterday morning with negative troponins and nonischemic EKG. Her chest pain reproducible on exam and worse with coughing. She has has multiple episodes of tachycardia (SVT vs atrial tachycardia) without evidence of atrial fibrillation or irregularity. No indication for anticoagulation at this time.  -Agree with treatment of primary infection -Wean oxygen as tolerated, on 4 L at baseline. - Continue '81mg'$  ASA daily.  - increase  metoprolol tartrate from '25mg'$  BID to three times daily  - restarting low dose lisinopril at '10mg'$  today  -s/p heparin gtt, ended 3/8 -Continuous monitoring on telemetry while inpatient -Defer invasive cardiac diagnostics.  #HFpEF (LVEF 55-60% w G1dd, mild MR, aortic sclerosis) -Appears euvolemic on exam. -Echo results as above  #Hypertension #CAD with history of MI 1999 -Resume home blood pressure medications -She will need to establish care with cardiology after discharge.  #AKI, resolved Creatinine on admission 1.5 GFR 34, back to 0.82 and >60.   This patient's plan of care was discussed and created with Dr. Donnelly Angelica and he is in agreement.  Signed: Tristan Schroeder , PA-C 11/17/2021, 11:29 AM [facility]

## 2021-11-17 NOTE — Progress Notes (Signed)
PULMONOLOGY         Date: 11/17/2021,   MRN# [id number] [name] 1933-01-16     AdmissionWeight: 50.5 kg                 CurrentWeight: 50.5 kg   Referring physician: Dr Algis Liming   CHIEF COMPLAINT:   Acute on chronic hypoxemic respiratory failure   HISTORY OF PRESENT ILLNESS   This is an 88 yo F with hx of chronic lung disease with Asthma COPD overlap syndrome (ACOS) and chronic hypoxemia on 4L/min  as well as RA , GERD, HTN. She does have husband at bedside and he is able to help with details of history.   Patient reports that home RN was able to evaluate her and noted that she was in distress with labored breathing and asked for patient to be seen in ER.   On admission to ER she required BIPAP 10/5 40% FioO2.   She had chest imaging done via CXR with no grossly appreciable infiltrate with chronic opacification due to breat implants.  CT angio done Aug 2022 reviewed independently which does show chronic bronchitic changes with crowding of right basal bronchial segments.  No fibrotic changes to suggest RA-ILD.   Blood work with AKI and hypoalbuminemia with hypokalemia.    She received IV fluids and after this the CBC did show dilutional effects with anemia and other cell lines reduced in parallel which is expected. Patient denies having hemoptysis but did report loose stools unsure if there is blood in it.   She does have leukocytosis on arrival. She was placed on heparin due to notable troponin elevation and had cardiology evaluation while in ER.   11/16/21- patient is improved, I reviewed CT chest findings with her and husband at bedside.   11/17/21- patient with desaturation on mild exertion. She has consolidated LLL and would benefit from mcuolytic neb therapy as well a more aggressive BPH with metaneb to optimize for dc home. Revewed plan with Attending physician today. Met with daughter to review medical plan   PAST MEDICAL HISTORY   Past Medical  History:  Diagnosis Date   Anxiety    Asthma    CHF (congestive heart failure) ([place])    COPD (chronic obstructive pulmonary disease) ([place])    Hypertension    Melanoma ([place])    scalp (2007) & leg (2013)   Myocardial infarction ([place]) 09/10/1997   no PCI or CABG   Osteoporosis    Rheumatoid arthritis ([place])    SVT (supraventricular tachycardia) ([place])      SURGICAL HISTORY   Past Surgical History:  Procedure Laterality Date   ABDOMINAL HYSTERECTOMY     BREAST SURGERY     CARDIAC SURGERY     CARPAL TUNNEL RELEASE     MELANOMA EXCISION  2007   scalp   MELANOMA EXCISION Right 2013   lower leg   SHOULDER SURGERY     TOTAL HIP REVISION  2015   VIDEO BRONCHOSCOPY Bilateral 04/08/2014   Procedure: VIDEO BRONCHOSCOPY WITHOUT FLUORO;  Surgeon: Juanito Doom, MD;  Location: [facility] ENDOSCOPY;  Service: Cardiopulmonary;  Laterality: Bilateral;     FAMILY HISTORY   History reviewed. No pertinent family history.   SOCIAL HISTORY   Social History   Tobacco Use   Smoking status: Former    Packs/day: 1.50    Years: 22.00    Pack years: 33.00    Types: Cigarettes    Quit date: 04/07/1974  Years since quitting: 47.6   Smokeless tobacco: Never  Substance Use Topics   Alcohol use: No   Drug use: No     MEDICATIONS    Home Medication:     Current Medication:  Current Facility-Administered Medications:    acetaminophen (TYLENOL) tablet 650 mg, 650 mg, Oral, Q6H PRN, Cristela Felt, Kathe Becton, MD, 650 mg at 11/16/21 1810   aspirin EC tablet 81 mg, 81 mg, Oral, Daily, Tang, [address], PA-C, 81 mg at 11/16/21 1215   azithromycin (ZITHROMAX) tablet 500 mg, 500 mg, Oral, QPM, Hongalgi, Anand D, MD, 500 mg at 11/16/21 1810   cefTRIAXone (ROCEPHIN) 1 g in sodium chloride 0.9 % 100 mL IVPB, 1 g, Intravenous, Q24H, Benita Gutter, RPH, Stopped at 11/16/21 1842   citalopram (CELEXA) tablet 20 mg, 20 mg, Oral, Daily, Benita Gutter, RPH, 20 mg at 11/16/21 0903   fluticasone  furoate-vilanterol (BREO ELLIPTA) 100-25 MCG/ACT 1 puff, 1 puff, Inhalation, Daily, 1 puff at 11/16/21 1215 **AND** umeclidinium bromide (INCRUSE ELLIPTA) 62.5 MCG/ACT 1 puff, 1 puff, Inhalation, Daily, [place], Kathe Becton, MD, 1 puff at 11/16/21 1216   heparin injection 5,000 Units, 5,000 Units, Subcutaneous, Q8H, Kristopher Oppenheim, DO, 5,000 Units at 11/17/21 0553   hydrALAZINE (APRESOLINE) injection 10 mg, 10 mg, Intravenous, Q6H PRN, Modena Jansky, MD, 10 mg at 11/17/21 0050   insulin aspart (novoLOG) injection 0-6 Units, 0-6 Units, Subcutaneous, TID WC, Hongalgi, Lenis Dickinson, MD   ipratropium-albuterol (DUONEB) 0.5-2.5 (3) MG/3ML nebulizer solution 3 mL, 3 mL, Nebulization, Q4H PRN, Ottie Glazier, MD, 3 mL at 11/16/21 0947   melatonin tablet 5 mg, 5 mg, Oral, QHS PRN, Sharion Settler, NP, 5 mg at 11/16/21 2155   methylPREDNISolone sodium succinate (SOLU-MEDROL) 125 mg/2 mL injection 120 mg, 120 mg, Intravenous, Q12H, Benita Gutter, RPH, 120 mg at 11/17/21 0550   metoprolol tartrate (LOPRESSOR) tablet 25 mg, 25 mg, Oral, BID, Tang, Lily Michelle, PA-C, 25 mg at 11/16/21 2139   montelukast (SINGULAIR) tablet 10 mg, 10 mg, Oral, QHS, P[place], Kathe Becton, MD, 10 mg at 11/16/21 2139   nitroGLYCERIN (NITROSTAT) SL tablet 0.4 mg, 0.4 mg, Sublingual, Q5 min PRN, P[place], Kathe Becton, MD   pantoprazole (PROTONIX) EC tablet 40 mg, 40 mg, Oral, Daily, Benita Gutter, RPH, 40 mg at 11/16/21 0962   sucralfate (CARAFATE) tablet 1 g, 1 g, Oral, QID, P[place], Kathe Becton, MD, 1 g at 11/16/21 2139    ALLERGIES   Celebrex [celecoxib] and Morphine and related     REVIEW OF SYSTEMS    Review of Systems:  Gen:  Denies  fever, sweats, chills weigh loss  HEENT: Denies blurred vision, double vision, ear pain, eye pain, hearing loss, nose bleeds, sore throat Cardiac:  No dizziness, chest pain or heaviness, chest tightness,edema Resp:   Denies cough or sputum porduction, shortness of breath,wheezing, hemoptysis,   Gi: Denies swallowing difficulty, stomach pain, nausea or vomiting, diarrhea, constipation, bowel incontinence Gu:  Denies bladder incontinence, burning urine Ext:   Denies Joint pain, stiffness or swelling Skin: Denies  skin rash, easy bruising or bleeding or hives Endoc:  Denies polyuria, polydipsia , polyphagia or weight change Psych:   Denies depression, insomnia or hallucinations   Other:  All other systems negative   VS: BP (!) 149/72 (BP Location: Right Arm)    Pulse 71    Temp 97.9 F (36.6 C) (Oral)    Resp 17    Ht 5' (1.524 m)    Wt 50.5  kg    SpO2 91%    BMI 21.74 kg/m      PHYSICAL EXAM    GENERAL:NAD, no fevers, chills, no weakness no fatigue HEAD: Normocephalic, atraumatic.  EYES: Pupils equal, round, reactive to light. Extraocular muscles intact. No scleral icterus.  MOUTH: Moist mucosal membrane. Dentition intact. No abscess noted.  EAR, NOSE, THROAT: Clear without exudates. No external lesions.  NECK: Supple. No thyromegaly. No nodules. No JVD.  PULMONARY:reduced air entry bilaterally  CARDIOVASCULAR: S1 and S2. Regular rate and rhythm. No murmurs, rubs, or gallops. No edema. Pedal pulses 2+ bilaterally.  GASTROINTESTINAL: Soft, nontender, nondistended. No masses. Positive bowel sounds. No hepatosplenomegaly.  MUSCULOSKELETAL: No swelling, clubbing, or edema. Range of motion full in all extremities.  NEUROLOGIC: Cranial nerves II through XII are intact. No gross focal neurological deficits. Sensation intact. Reflexes intact.  SKIN: No ulceration, lesions, rashes, or cyanosis. Skin warm and dry. Turgor intact.  PSYCHIATRIC: Mood, affect within normal limits. The patient is awake, alert and oriented x 3. Insight, judgment intact.       IMAGING    CT Angio Chest Pulmonary Embolism (PE) W or WO Contrast  Result Date: 11/15/2021 CLINICAL DATA:  Chronic respiratory failure EXAM: CT ANGIOGRAPHY CHEST WITH CONTRAST TECHNIQUE: Multidetector CT imaging of the chest  was performed using the standard protocol during bolus administration of intravenous contrast. Multiplanar CT image reconstructions and MIPs were obtained to evaluate the vascular anatomy. RADIATION DOSE REDUCTION: This exam was performed according to the departmental dose-optimization program which includes automated exposure control, adjustment of the mA and/or kV according to patient size and/or use of iterative reconstruction technique. CONTRAST:  52m OMNIPAQUE IOHEXOL 300 MG/ML  SOLN COMPARISON:  04/11/2021 FINDINGS: Cardiovascular: Atherosclerotic calcifications of the thoracic aorta are noted. No aneurysmal dilatation or dissection is noted. Mild cardiac enlargement is seen. No pericardial effusion is seen. Coronary calcifications are noted. Pulmonary artery shows a normal branching pattern bilaterally. No intraluminal filling defect is identified. Mediastinum/Nodes: Thoracic inlet is within normal limits. No sizable hilar or mediastinal adenopathy is noted. The esophagus as visualized is within normal limits. Lungs/Pleura: Diffuse emphysematous changes are noted in the lungs bilaterally stable in appearance from the prior exam. Small effusion is noted on the left with lower lobe infiltrate new from the prior study. No sizable parenchymal nodules are seen. Upper Abdomen: Visualized upper abdomen is within normal limits. Musculoskeletal: Degenerative changes of the thoracic spine are noted. No definitive rib abnormality is seen. Bilateral breast implants are seen with evidence of stable intracapsular rupture similar to that noted on the prior exam. Review of the MIP images confirms the above findings. IMPRESSION: No evidence of pulmonary emboli. New left lower lobe infiltrate with associated small effusion. Aortic Atherosclerosis (ICD10-I70.0) and Emphysema (ICD10-J43.9). Electronically Signed   By: MInez CatalinaM.D.   On: 11/15/2021 21:21   DG Chest Portable 1 View  Result Date: 11/14/2021 CLINICAL DATA:   Respiratory distress. EXAM: PORTABLE CHEST 1 VIEW COMPARISON:  Chest radiograph dated April 13, 2019 FINDINGS: The heart is enlarged. Atherosclerotic calcification of the aortic arch. Left lower lobe hazy opacity with silhouetting of the left costophrenic angle. Bilateral mild pleural/parenchymal scarring, unchanged. IMPRESSION: 1. Stable cardiomegaly. 2. Left lower lobe hazy opacity which may represent atelectasis or effusion. Underlying airspace disease can not be excluded. Electronically Signed   By: IKeane PoliceD.O.   On: 11/14/2021 15:59   ECHOCARDIOGRAM COMPLETE  Result Date: 11/15/2021    ECHOCARDIOGRAM REPORT   Patient Name:  Steva Colder Date of Exam: 11/15/2021 Medical Rec #:  [id number]        Height:       60.0 in Accession #:    [id number]       Weight:       111.3 lb Date of Birth:  10-13-1932         BSA:          1.455 m Patient Age:    51 years         BP:           161/80 mmHg Patient Gender: F                HR:           93 bpm. Exam Location:  [facility] Procedure: 2D Echo, Cardiac Doppler and Color Doppler Indications:     Chest pain R07.9  History:         Patient has prior history of Echocardiogram examinations, most                  recent 02/23/2021. CHF, Previous Myocardial Infarction, COPD;                  Risk Factors:Hypertension. SVT.  Sonographer:     Sherrie Sport Referring Phys:  [id number] [facility] [place] Diagnosing Phys: Donnelly Angelica  Sonographer Comments: Technically challenging study due to limited acoustic windows. Image acquisition challenging due to breast implants and Image acquisition challenging due to COPD. IMPRESSIONS  1. Left ventricular ejection fraction, by estimation, is 55 to 60%. The left ventricle has normal function. The left ventricle has no regional wall motion abnormalities. Left ventricular diastolic parameters are consistent with Grade I diastolic dysfunction (impaired relaxation).  2. Right ventricular systolic function is normal. The right ventricular size is not  well visualized.  3. Left atrial size was moderately dilated.  4. The mitral valve is degenerative. Mild mitral valve regurgitation. No evidence of mitral stenosis. The mean mitral valve gradient is 5.0 mmHg.  5. The aortic valve is calcified. Aortic valve regurgitation is trivial. Aortic valve sclerosis/calcification is present, without any evidence of aortic stenosis. FINDINGS  Left Ventricle: Left ventricular ejection fraction, by estimation, is 55 to 60%. The left ventricle has normal function. The left ventricle has no regional wall motion abnormalities. The left ventricular internal cavity size was normal in size. There is  no left ventricular hypertrophy. Left ventricular diastolic parameters are consistent with Grade I diastolic dysfunction (impaired relaxation). Right Ventricle: The right ventricular size is not well visualized. Right vetricular wall thickness was not well visualized. Right ventricular systolic function is normal. Left Atrium: Left atrial size was moderately dilated. Right Atrium: Right atrial size was not well visualized. Pericardium: There is no evidence of pericardial effusion. Mitral Valve: The mitral valve is degenerative in appearance. Mild mitral valve regurgitation. No evidence of mitral valve stenosis. MV peak gradient, 9.6 mmHg. The mean mitral valve gradient is 5.0 mmHg. Tricuspid Valve: The tricuspid valve is normal in structure. Tricuspid valve regurgitation is trivial. Aortic Valve: The aortic valve is calcified. Aortic valve regurgitation is trivial. Aortic valve sclerosis/calcification is present, without any evidence of aortic stenosis. Aortic valve mean gradient measures 4.0 mmHg. Aortic valve peak gradient measures 7.7 mmHg. Aortic valve area, by VTI measures 1.97 cm. Pulmonic Valve: The pulmonic valve was not well visualized. Pulmonic valve regurgitation is not visualized. Aorta: The aortic root is normal in size and structure. IAS/Shunts: The interatrial septum  was not  well visualized.  LEFT VENTRICLE PLAX 2D LVIDd:         4.30 cm   Diastology LVIDs:         3.10 cm   LV e' medial:    4.03 cm/s LV PW:         1.10 cm   LV E/e' medial:  2.0 LV IVS:        1.10 cm   LV e' lateral:   7.34 cm/s LVOT diam:     2.00 cm   LV E/e' lateral: 1.1 LV SV:         51 LV SV Index:   35 LVOT Area:     3.14 cm  RIGHT VENTRICLE RV S prime:     11.20 cm/s TAPSE (M-mode): 2.0 cm LEFT ATRIUM             Index        RIGHT ATRIUM           Index LA diam:        3.60 cm 2.47 cm/m   RA Area:     14.90 cm LA Vol (A2C):   72.1 ml 49.55 ml/m  RA Volume:   30.50 ml  20.96 ml/m LA Vol (A4C):   49.3 ml 33.88 ml/m LA Biplane Vol: 59.7 ml 41.02 ml/m  AORTIC VALVE                    PULMONIC VALVE AV Area (Vmax):    2.21 cm     PV Vmax:        0.56 m/s AV Area (Vmean):   1.79 cm     PV Vmean:       38.300 cm/s AV Area (VTI):     1.97 cm     PV VTI:         0.114 m AV Vmax:           139.00 cm/s  PV Peak grad:   1.3 mmHg AV Vmean:          94.400 cm/s  PV Mean grad:   1.0 mmHg AV VTI:            0.257 m      RVOT Peak grad: 6 mmHg AV Peak Grad:      7.7 mmHg AV Mean Grad:      4.0 mmHg LVOT Vmax:         97.90 cm/s LVOT Vmean:        53.700 cm/s LVOT VTI:          0.161 m LVOT/AV VTI ratio: 0.63  AORTA Ao Root diam: 2.90 cm MITRAL VALVE                TRICUSPID VALVE MV Area (PHT): 4.31 cm     TR Peak grad:   10.8 mmHg MV Peak grad:  9.6 mmHg     TR Vmax:        164.00 cm/s MV Mean grad:  5.0 mmHg MV Vmax:       1.55 m/s     SHUNTS MV Vmean:      103.0 cm/s   Systemic VTI:  0.16 m MV Decel Time: 176 msec     Systemic Diam: 2.00 cm MV E velocity: 8.05 cm/s    Pulmonic VTI:  0.252 m MV A velocity: 133.00 cm/s MV E/A ratio:  0.06 Donnelly Angelica Electronically signed by Donnelly Angelica Signature Date/Time: 11/15/2021/5:13:23 PM  Final          ASSESSMENT/PLAN   Acute on chronic hypoxemic respiratory failure -etiology differential includes acute COPD exacerbation vs viral LRTI vs atypical pneumonia , vs  acute blood loss anemia vs acute on chronic diastolic CHF - present on admission  - COVID19 negative   - supplemental O2 during my evaluation 5L/min  - nfectious workup for pneumonia as below -Respiratory viral panel-negative -serum fungitell -legionella ab -strep pneumoniae ur AG -Histoplasma Ur Ag -sputum resp cultures -reviewed pertinent imaging with patient today - ESR/CRP -severely elevated  -PT/OT for d/c planning  -please encourage patient to use incentive spirometer few times each hour while hospitalized.   Metaneb and mucomyst for BPH   Anemia    Trending down H/h     - fecal occult blood stool - patient reports loose stool      -currently on heparin - consider DC if not needed as cardiology thinks its unlikely ACS   Acute on chronic diastolic CHF    In context of AE COPD vs LRTI   -BNP-significantly elevated     - cardiology consult - appreciate input - Dr Corky Sox and Orinda Kenner     - s/p TTE - reviewed no severe findings   Acute exacerbation of COPD     -Wean down IV solumedrol reduced to 80 bid from 120    -Continue rocephin zithromax for now     -PRN duoneb   - Incruse ellipta   - Breo once daily    - IS and Flutter several times daily    Severe GERD   -Carafate     Thank you for allowing me to participate in the care of this patient.  Patient has at least 1 severe active medical problem which is life threatening and is being managed during this evaluation  Patient/Family are satisfied with care plan and all questions have been answered.  This document was prepared using Dragon voice recognition software and may include unintentional dictation errors.     Ottie Glazier, M.D.  Division of [place]

## 2021-11-17 NOTE — Assessment & Plan Note (Deleted)
Replace and follow. ?

## 2021-11-17 NOTE — TOC Progression Note (Signed)
Transition of Care (TOC) - Progression Note  ? ? ?Patient Details  ?Name: BREIONA COUVILLON ?MRN: [id number] ?Date of Birth: Jan 23, 1933 ? ?Transition of Care (TOC) CM/SW Contact  ?Candie Chroman, LCSW ?Phone Number: ?11/17/2021, 12:18 PM ? ?Clinical Narrative:  Patient and husband still prefer home health. No agency preference. Set up with [facility] for PT, OT, RN. No DME recommendations. Husband said her current oxygen concentrator goes up to 6 L.  ? ?Expected Discharge Plan: Home/Self Care ?Barriers to Discharge: Continued Medical Work up ? ?Expected Discharge Plan and Services ?Expected Discharge Plan: Home/Self Care ?  ?  ?Post Acute Care Choice: NA ?Living arrangements for the past 2 months: [place] ?                ?  ?  ?  ?  ?  ?  ?  ?  ?  ?  ? ? ?Social Determinants of Health (SDOH) Interventions ?  ? ?Readmission Risk Interventions ?Readmission Risk Prevention Plan 11/16/2021  ?Transportation Screening Complete  ?PCP or Specialist Appt within 3-5 Days Complete  ?Social Work Consult for [place] Planning/Counseling Complete  ?Palliative Care Screening Not Applicable  ?Medication Review Press photographer) Complete  ?Some recent data might be hidden  ? ? ?

## 2021-11-18 ENCOUNTER — Inpatient Hospital Stay: Payer: Medicare HMO

## 2021-11-18 DIAGNOSIS — I471 Supraventricular tachycardia: Secondary | ICD-10-CM

## 2021-11-18 DIAGNOSIS — R627 Adult failure to thrive: Secondary | ICD-10-CM

## 2021-11-18 LAB — BASIC METABOLIC PANEL
Anion gap: 5 (ref 5–15)
BUN: 34 mg/dL — ABNORMAL HIGH (ref 8–23)
CO2: 29 mmol/L (ref 22–32)
Calcium: 8.8 mg/dL — ABNORMAL LOW (ref 8.9–10.3)
Chloride: 105 mmol/L (ref 98–111)
Creatinine, Ser: 0.82 mg/dL (ref 0.44–1.00)
GFR, Estimated: >60 mL/min (ref >60)
Glucose, Bld: 108 mg/dL — ABNORMAL HIGH (ref 70–99)
Potassium: 4.2 mmol/L (ref 3.5–5.1)
Sodium: 139 mmol/L (ref 135–145)

## 2021-11-18 LAB — GLUCOSE, CAPILLARY
Glucose-Capillary: 117 mg/dL — ABNORMAL HIGH (ref 70–99)
Glucose-Capillary: 151 mg/dL — ABNORMAL HIGH (ref 70–99)
Glucose-Capillary: 147 mg/dL — ABNORMAL HIGH (ref 70–99)
Glucose-Capillary: 171 mg/dL — ABNORMAL HIGH (ref 70–99)

## 2021-11-18 LAB — MAGNESIUM: Magnesium: 2.2 mg/dL (ref 1.7–2.4)

## 2021-11-18 LAB — FUNGITELL, SERUM: Fungitell Result: <31 pg/mL (ref <80)

## 2021-11-18 MED ORDER — CLONIDINE HCL 0.1 MG PO TABS: 0.2000 mg | ORAL_TABLET | Freq: Four times a day (QID) | ORAL | Status: DC

## 2021-11-18 MED ORDER — METHYLPREDNISOLONE SODIUM SUCC 125 MG IJ SOLR
60.0000 mg | INTRAMUSCULAR | Status: DC
  Administered 2021-11-19: 60 mg via INTRAVENOUS
  Filled 2021-11-18: qty 2

## 2021-11-18 MED ORDER — FUROSEMIDE 10 MG/ML IJ SOLN
40.0000 mg | Freq: Once | INTRAMUSCULAR | Status: AC
  Administered 2021-11-18: 40 mg via INTRAVENOUS
  Filled 2021-11-18: qty 4

## 2021-11-18 MED ORDER — IPRATROPIUM-ALBUTEROL 0.5-2.5 (3) MG/3ML IN SOLN
3.0000 mL | Freq: Three times a day (TID) | RESPIRATORY_TRACT | Status: DC
  Administered 2021-11-18 – 2021-11-19 (×2): 3 mL via RESPIRATORY_TRACT
  Filled 2021-11-18 (×2): qty 3

## 2021-11-18 MED ORDER — ACETYLCYSTEINE 20 % IN SOLN
3.0000 mL | Freq: Two times a day (BID) | RESPIRATORY_TRACT | Status: DC
  Administered 2021-11-19: 3 mL via RESPIRATORY_TRACT
  Filled 2021-11-18 (×2): qty 4

## 2021-11-18 MED ORDER — AMIODARONE HCL 200 MG PO TABS
400.0000 mg | ORAL_TABLET | Freq: Two times a day (BID) | ORAL | Status: DC
  Administered 2021-11-18 (×2): 400 mg via ORAL
  Filled 2021-11-18 (×2): qty 2

## 2021-11-18 MED ORDER — ALBUTEROL SULFATE (2.5 MG/3ML) 0.083% IN NEBU
2.5000 mg | INHALATION_SOLUTION | RESPIRATORY_TRACT | Status: DC | PRN
  Administered 2021-11-21 – 2021-11-22 (×5): 2.5 mg via RESPIRATORY_TRACT
  Filled 2021-11-18 (×5): qty 3

## 2021-11-18 MED ORDER — METOPROLOL TARTRATE 25 MG PO TABS
25.0000 mg | ORAL_TABLET | Freq: Two times a day (BID) | ORAL | Status: DC
  Administered 2021-11-18 – 2021-11-21 (×6): 25 mg via ORAL
  Filled 2021-11-18 (×6): qty 1

## 2021-11-18 NOTE — Progress Notes (Signed)
Assumed care of pt at 1900. A&O x4. Requiring 5 L [place] overnight. C/o 10/10 headache, Tylenol ineffective, however, effective when used w/ Advil. Husband at bedside and updated on POC w/ verbalized understanding. Purewick in place. See flowsheets for full assessment. Medication administration per MAR. Comfort and safety maintained.  ?

## 2021-11-18 NOTE — Progress Notes (Addendum)
Physical Therapy Treatment ?Patient Details ?Name: [name] ?MRN: [id number] ?DOB: 1933-06-17 ?Today's Date: 11/18/2021 ? ? ?History of Present Illness 88 year old female with medical history significant for CAD, HTN, COPD, chronic respiratory failure with hypoxia on 4 L/min nasal cannula oxygen, rheumatoid arthritis, osteoporosis and GERD presented to the ED with complaints of dyspnea.  She reportedly saw her MD for left hip pain and was diagnosed with a "crack" in the hip bone and prescribed some meds.  Following taking the meds prescribed, she started experiencing worsening dyspnea.  This was associated with productive cough without fever or chest pain.  Home DuoNebs did not offer relief.  Admitted for acute on chronic respiratory failure with hypoxia due to suspected COPD exacerbation, community-acquired pneumonia. ? ?  ?PT Comments  ? ? Pt was seen for mobility and declines very specifically, stating her O2 sats don't support the effort.  Pt was instead willing to do some bed ex's and then her tray for lunch arrived.  Pt is not at all hypoxic, had resting sats of 97% and pulse 67.  No declines occurred for bed ex, but is not willing to get OOB.  Follow up with her as scheduled, to encourage OOB to chair and to do more WB exercise.     ?Recommendations for follow up therapy are one component of a multi-disciplinary discharge planning process, led by the attending physician.  Recommendations may be updated based on patient status, additional functional criteria and insurance authorization. ? ?Follow Up Recommendations ? Skilled nursing-short term rehab (<3 hours/day) ?  ?  ?Assistance Recommended at Discharge Intermittent Supervision/Assistance  ?Patient can return home with the following A little help with walking and/or transfers;A little help with bathing/dressing/bathroom;Assistance with cooking/housework;Assist for transportation;Help with stairs or ramp for entrance ?  ?Equipment Recommendations ? None  recommended by PT  ?  ?Recommendations for Other Services   ? ? ?  ?Precautions / Restrictions Precautions ?Precautions: Fall ?Precaution Comments: monitor O2 sats ?Restrictions ?Weight Bearing Restrictions: No  ?  ? ?Mobility ? Bed Mobility ?Overal bed mobility: Needs Assistance ?  ?  ?  ?  ?  ?  ?General bed mobility comments: refused OOB ?  ? ?Transfers ?Overall transfer level: Needs assistance ?  ?  ?  ?  ?  ?  ?  ?  ?General transfer comment: pt refused to attempt ?  ? ?Ambulation/Gait ?  ?  ?  ?  ?  ?  ?  ?  ? ? ?Stairs ?  ?  ?  ?  ?  ? ? ?Wheelchair Mobility ?  ? ?Modified Rankin (Stroke Patients Only) ?  ? ? ?  ?Balance   ?  ?  ?  ?  ?  ?  ?  ?  ?  ?  ?  ?  ?  ?  ?  ?  ?  ?  ?  ? ?  ?Cognition Arousal/Alertness: Awake/alert ?Behavior During Therapy: [facility] for tasks assessed/performed ?Overall Cognitive Status: Within Functional Limits for tasks assessed ?  ?  ?  ?  ?  ?  ?  ?  ?  ?  ?  ?  ?  ?  ?  ?  ?  ?  ?  ? ?  ?Exercises General Exercises - Lower Extremity ?Ankle Circles/Pumps: AROM, 5 reps ?Quad Sets: AROM, 10 reps ?Heel Slides: AROM, 10 reps ?Hip ABduction/ADduction: AROM, 10 reps ? ?  ?General Comments General comments (skin integrity, edema, etc.): Pt and  husband report pt has a pelvic fracture, but cannot find corroboration on the chart.  Contacting MD, who also has only heard this as pt report. ?  ?  ? ?Pertinent Vitals/Pain Pain Assessment ?Pain Assessment: Faces ?Faces Pain Scale: Hurts a little bit ?Pain Location: L hip with pt reporting she has a diagnosed pelvic fracture ?Pain Descriptors / Indicators: Aching, Guarding ?Pain Intervention(s): Monitored during session, Repositioned  ? ? ?Home Living   ?  ?  ?  ?  ?  ?  ?  ?  ?  ?   ?  ?Prior Function    ?  ?  ?   ? ?PT Goals (current goals can now be found in the care plan section) Progress towards PT goals: Not progressing toward goals - comment ? ?  ?Frequency ? ? ? Min 2X/week ? ? ? ?  ?PT Plan Current plan remains appropriate   ? ? ?Co-evaluation   ?  ?  ?  ?  ? ?  ?AM-PAC PT "6 Clicks" Mobility   ?Outcome Measure ? Help needed turning from your back to your side while in a flat bed without using bedrails?: A Little ?Help needed moving from lying on your back to sitting on the side of a flat bed without using bedrails?: A Little ?Help needed moving to and from a bed to a chair (including a wheelchair)?: A Little ?Help needed standing up from a chair using your arms (e.g., wheelchair or bedside chair)?: A Lot ?Help needed to walk in hospital room?: A Lot ?Help needed climbing 3-5 steps with a railing? : A Lot ?6 Click Score: 15 ? ?  ?End of Session Equipment Utilized During Treatment: Oxygen ?Activity Tolerance: Patient limited by fatigue ?Patient left: with bed alarm set;with call bell/phone within reach ?Nurse Communication: Mobility status ?PT Visit Diagnosis: Muscle weakness (generalized) (M62.81);Difficulty in walking, not elsewhere classified (R26.2) ?  ? ? ?Time: 1215-1229 ?PT Time Calculation (min) (ACUTE ONLY): 14 min ? ?Charges:  $Therapeutic Exercise: 8-22 mins     ?Ramond Dial ?11/18/2021, 1:11 PM ? ?Mee Hives, PT PhD ?Acute Rehab Dept. Number: [facility] [phone] and [place] [phone] ? ? ?

## 2021-11-18 NOTE — Progress Notes (Signed)
[place] NOTE       Patient ID: [name] MRN: [id number] DOB/AGE: [birth year] 86 y.o.  Admit date: 11/14/2021 Referring Physician Dr. Algis Liming Primary Physician Lloyd Huger  Primary Cardiologist in [place] (last seen 1 year ago)  Reason for Consultation atypical chest pain   HPI: The patient is an 88 year old female with a past medical history notable for HFpEF (LVEF 55-60% with myxomatous MV, mild AR with g3DD), history of MI 1999, COPD on 4 L at baseline, hypertension, rheumatoid arthritis who presented to [facility] ED with shortness of breath and productive cough for 2 to 3 days.  She presented hypoxic to the 70s and severely dyspneic requiring BiPAP initially, discovered to have pneumonia.  Cardiology was consulted because of her elevated troponin and chest pain.  Interval History: - 25 minute paroxysm of SVT this morning. Describes a fluttering sensation during this.  - NO chest pain.  - Respiratory status still not back to baseline.   Review of systems complete and found to be negative unless listed above     Past Medical History:  Diagnosis Date   Anxiety    Asthma    CHF (congestive heart failure) (HCC)    COPD (chronic obstructive pulmonary disease) ([place])    Hypertension    Melanoma ([place])    scalp (2007) & leg (2013)   Myocardial infarction ([place]) 09/10/1997   no PCI or CABG   Osteoporosis    Rheumatoid arthritis (HCC)    SVT (supraventricular tachycardia) ([place])     Past Surgical History:  Procedure Laterality Date   ABDOMINAL HYSTERECTOMY     BREAST SURGERY     CARDIAC SURGERY     CARPAL TUNNEL RELEASE     MELANOMA EXCISION  2007   scalp   MELANOMA EXCISION Right 2013   lower leg   SHOULDER SURGERY     TOTAL HIP REVISION  2015   VIDEO BRONCHOSCOPY Bilateral 04/08/2014   Procedure: VIDEO BRONCHOSCOPY WITHOUT FLUORO;  Surgeon: Juanito Doom, MD;  Location: [facility] ENDOSCOPY;  Service: Cardiopulmonary;  Laterality: Bilateral;     Medications Prior to Admission  Medication Sig Dispense Refill Last Dose   acetaminophen (TYLENOL) 325 MG tablet Take 650 mg by mouth every 6 (six) hours as needed.   11/14/2021   albuterol (PROVENTIL) (2.5 MG/3ML) 0.083% nebulizer solution Inhale 3 mLs into the lungs every 6 (six) hours as needed.   11/14/2021   Cholecalciferol (VITAMIN D PO) Take 25 mcg by mouth daily.   11/14/2021   citalopram (CELEXA) 20 MG tablet Take 20 mg by mouth daily.   11/14/2021   fish oil-omega-3 fatty acids 1000 MG capsule Take 2 g by mouth daily.   11/14/2021   hydrochlorothiazide (HYDRODIURIL) 12.5 MG tablet Take 12.5 mg by mouth daily.   11/14/2021   ipratropium-albuterol (DUONEB) 0.5-2.5 (3) MG/3ML SOLN Take 3 mLs by nebulization every 6 (six) hours as needed.   11/13/2021   lisinopril (ZESTRIL) 40 MG tablet Take 40 mg by mouth daily.   11/14/2021   montelukast (SINGULAIR) 10 MG tablet Take 10 mg by mouth at bedtime.   11/13/2021   omeprazole (PRILOSEC) 20 MG capsule Take 20 mg by mouth 2 (two) times daily before a meal.   11/14/2021   sucralfate (CARAFATE) 1 g tablet Take 1 g by mouth 4 (four) times daily.   11/13/2021   traMADol (ULTRAM) 50 MG tablet Take 50 mg by mouth 2 (two) times daily as needed.   11/13/2021  TRELEGY ELLIPTA 100-62.5-25 MCG/INH AEPB Take 1 puff by mouth daily.   11/14/2021   acetaminophen-codeine (TYLENOL #3) 300-30 MG tablet Take 1 tablet by mouth every 4 (four) hours as needed. (Patient not taking: Reported on 04/20/2021)      alendronate (FOSAMAX) 70 MG tablet Take 70 mg by mouth once a week.   11/10/2021   bisoprolol (ZEBETA) 5 MG tablet Take 0.5 tablets (2.5 mg total) by mouth at bedtime. (Patient not taking: Reported on 11/14/2021) 30 tablet 0 Not Taking   diltiazem (CARDIZEM CD) 180 MG 24 hr capsule Take 1 capsule (180 mg total) by mouth daily. (Patient not taking: Reported on 11/14/2021) 30 capsule 0 Not Taking   furosemide (LASIX) 20 MG tablet Take 1 tablet (20 mg total) by mouth daily. (Patient not taking:  Reported on 11/14/2021) 30 tablet 0 Not Taking   nitroGLYCERIN (NITROSTAT) 0.4 MG SL tablet Place 1 tablet (0.4 mg total) under the tongue every 5 (five) minutes as needed for chest pain. 30 tablet 3 prn   potassium chloride SA (KLOR-CON) 20 MEQ tablet Take 1 tablet (20 mEq total) by mouth daily. (Patient not taking: Reported on 11/14/2021) 30 tablet 0 Not Taking   tizanidine (ZANAFLEX) 2 MG capsule Take 2 mg by mouth 3 (three) times daily. (Patient not taking: Reported on 11/14/2021)   Not Taking    Social History   Socioeconomic History   Marital status: Married    Spouse name: Not on file   Number of children: Not on file   Years of education: Not on file   Highest education level: Not on file  Occupational History   Not on file  Tobacco Use   Smoking status: Former    Packs/day: 1.50    Years: 22.00    Pack years: 33.00    Types: Cigarettes    Quit date: 04/07/1974    Years since quitting: 47.6   Smokeless tobacco: Never  Substance and Sexual Activity   Alcohol use: No   Drug use: No   Sexual activity: Never  Other Topics Concern   Not on file  Social History Narrative   Lives in [place].  Originally from [place].  Children live in [place].   Social Determinants of Health   Financial Resource Strain: Not on file  Food Insecurity: Not on file  Transportation Needs: Not on file  Physical Activity: Not on file  Stress: Not on file  Social Connections: Not on file  Intimate Partner Violence: Not on file    History reviewed. No pertinent family history.    Review of systems complete and found to be negative unless listed above    PHYSICAL EXAM General: Elderly appearing Caucasian female, well nourished, in no acute distress.  Sitting upright in PCU bed. HEENT:  Normocephalic and atraumatic. Neck:  No JVD.  Lungs: Normal work of breathing on O2 by nasal cannula.  Poor air movement, coarse breath sounds, no wheezes Heart: HRRR . Normal S1 and S2.  3/6 systolic  murmur heard at the RUSB and apex  Chest: anterior chest wall with tenderness to palpation Abdomen: Non-distended appearing.  Msk: Normal strength and tone for age. Extremities: Chronic skin discoloration in bilateral lower extremities likely 2/2 arterial insufficiency.  No edema. Neuro: Alert and oriented X 3. Psych:  Answers questions appropriately.   Labs:   Lab Results  Component Value Date   WBC 9.8 11/17/2021   HGB 9.3 (L) 11/17/2021   HCT 29.9 (L) 11/17/2021   MCV 83.5  11/17/2021   PLT 365 11/17/2021    Recent Labs  Lab 11/15/21 0418 11/16/21 0440 11/18/21 0431  NA 135   < > 139  K 3.2*   < > 4.2  CL 100   < > 105  CO2 27   < > 29  BUN 45*   < > 34*  CREATININE 1.02*   < > 0.82  CALCIUM 8.2*   < > 8.8*  PROT 5.9*  --   --   BILITOT 0.3  --   --   ALKPHOS 54  --   --   ALT 13  --   --   AST 23  --   --   GLUCOSE 174*   < > 108*   < > = values in this interval not displayed.    Lab Results  Component Value Date   TROPONINI <0.30 02/23/2013    No results found for: CHOL No results found for: HDL No results found for: LDLCALC No results found for: TRIG No results found for: CHOLHDL No results found for: LDLDIRECT    Radiology: CT Angio Chest Pulmonary Embolism (PE) W or WO Contrast  Result Date: 11/15/2021 CLINICAL DATA:  Chronic respiratory failure EXAM: CT ANGIOGRAPHY CHEST WITH CONTRAST TECHNIQUE: Multidetector CT imaging of the chest was performed using the standard protocol during bolus administration of intravenous contrast. Multiplanar CT image reconstructions and MIPs were obtained to evaluate the vascular anatomy. RADIATION DOSE REDUCTION: This exam was performed according to the departmental dose-optimization program which includes automated exposure control, adjustment of the mA and/or kV according to patient size and/or use of iterative reconstruction technique. CONTRAST:  63m OMNIPAQUE IOHEXOL 300 MG/ML  SOLN COMPARISON:  04/11/2021 FINDINGS:  Cardiovascular: Atherosclerotic calcifications of the thoracic aorta are noted. No aneurysmal dilatation or dissection is noted. Mild cardiac enlargement is seen. No pericardial effusion is seen. Coronary calcifications are noted. Pulmonary artery shows a normal branching pattern bilaterally. No intraluminal filling defect is identified. Mediastinum/Nodes: Thoracic inlet is within normal limits. No sizable hilar or mediastinal adenopathy is noted. The esophagus as visualized is within normal limits. Lungs/Pleura: Diffuse emphysematous changes are noted in the lungs bilaterally stable in appearance from the prior exam. Small effusion is noted on the left with lower lobe infiltrate new from the prior study. No sizable parenchymal nodules are seen. Upper Abdomen: Visualized upper abdomen is within normal limits. Musculoskeletal: Degenerative changes of the thoracic spine are noted. No definitive rib abnormality is seen. Bilateral breast implants are seen with evidence of stable intracapsular rupture similar to that noted on the prior exam. Review of the MIP images confirms the above findings. IMPRESSION: No evidence of pulmonary emboli. New left lower lobe infiltrate with associated small effusion. Aortic Atherosclerosis (ICD10-I70.0) and Emphysema (ICD10-J43.9). Electronically Signed   By: MInez CatalinaM.D.   On: 11/15/2021 21:21   DG Chest Portable 1 View  Result Date: 11/14/2021 CLINICAL DATA:  Respiratory distress. EXAM: PORTABLE CHEST 1 VIEW COMPARISON:  Chest radiograph dated April 13, 2019 FINDINGS: The heart is enlarged. Atherosclerotic calcification of the aortic arch. Left lower lobe hazy opacity with silhouetting of the left costophrenic angle. Bilateral mild pleural/parenchymal scarring, unchanged. IMPRESSION: 1. Stable cardiomegaly. 2. Left lower lobe hazy opacity which may represent atelectasis or effusion. Underlying airspace disease can not be excluded. Electronically Signed   By: IKeane PoliceD.O.    On: 11/14/2021 15:59   ECHOCARDIOGRAM COMPLETE  Result Date: 11/15/2021    ECHOCARDIOGRAM REPORT   Patient  Name:   [name] Date of Exam: 11/15/2021 Medical Rec #:  [id number]        Height:       60.0 in Accession #:    [id number]       Weight:       111.3 lb Date of Birth:  06/07/1933         BSA:          1.455 m Patient Age:    55 years         BP:           161/80 mmHg Patient Gender: F                HR:           93 bpm. Exam Location:  [facility] Procedure: 2D Echo, Cardiac Doppler and Color Doppler Indications:     Chest pain R07.9  History:         Patient has prior history of Echocardiogram examinations, most                  recent 02/23/2021. CHF, Previous Myocardial Infarction, COPD;                  Risk Factors:Hypertension. SVT.  Sonographer:     Sherrie Sport Referring Phys:  [id number] [facility] [place] Diagnosing Phys: Donnelly Angelica  Sonographer Comments: Technically challenging study due to limited acoustic windows. Image acquisition challenging due to breast implants and Image acquisition challenging due to COPD. IMPRESSIONS  1. Left ventricular ejection fraction, by estimation, is 55 to 60%. The left ventricle has normal function. The left ventricle has no regional wall motion abnormalities. Left ventricular diastolic parameters are consistent with Grade I diastolic dysfunction (impaired relaxation).  2. Right ventricular systolic function is normal. The right ventricular size is not well visualized.  3. Left atrial size was moderately dilated.  4. The mitral valve is degenerative. Mild mitral valve regurgitation. No evidence of mitral stenosis. The mean mitral valve gradient is 5.0 mmHg.  5. The aortic valve is calcified. Aortic valve regurgitation is trivial. Aortic valve sclerosis/calcification is present, without any evidence of aortic stenosis. FINDINGS  Left Ventricle: Left ventricular ejection fraction, by estimation, is 55 to 60%. The left ventricle has normal function. The left ventricle has  no regional wall motion abnormalities. The left ventricular internal cavity size was normal in size. There is  no left ventricular hypertrophy. Left ventricular diastolic parameters are consistent with Grade I diastolic dysfunction (impaired relaxation). Right Ventricle: The right ventricular size is not well visualized. Right vetricular wall thickness was not well visualized. Right ventricular systolic function is normal. Left Atrium: Left atrial size was moderately dilated. Right Atrium: Right atrial size was not well visualized. Pericardium: There is no evidence of pericardial effusion. Mitral Valve: The mitral valve is degenerative in appearance. Mild mitral valve regurgitation. No evidence of mitral valve stenosis. MV peak gradient, 9.6 mmHg. The mean mitral valve gradient is 5.0 mmHg. Tricuspid Valve: The tricuspid valve is normal in structure. Tricuspid valve regurgitation is trivial. Aortic Valve: The aortic valve is calcified. Aortic valve regurgitation is trivial. Aortic valve sclerosis/calcification is present, without any evidence of aortic stenosis. Aortic valve mean gradient measures 4.0 mmHg. Aortic valve peak gradient measures 7.7 mmHg. Aortic valve area, by VTI measures 1.97 cm. Pulmonic Valve: The pulmonic valve was not well visualized. Pulmonic valve regurgitation is not visualized. Aorta: The aortic root is normal in size and structure. IAS/Shunts:  The interatrial septum was not well visualized.  LEFT VENTRICLE PLAX 2D LVIDd:         4.30 cm   Diastology LVIDs:         3.10 cm   LV e' medial:    4.03 cm/s LV PW:         1.10 cm   LV E/e' medial:  2.0 LV IVS:        1.10 cm   LV e' lateral:   7.34 cm/s LVOT diam:     2.00 cm   LV E/e' lateral: 1.1 LV SV:         51 LV SV Index:   35 LVOT Area:     3.14 cm  RIGHT VENTRICLE RV S prime:     11.20 cm/s TAPSE (M-mode): 2.0 cm LEFT ATRIUM             Index        RIGHT ATRIUM           Index LA diam:        3.60 cm 2.47 cm/m   RA Area:     14.90 cm  LA Vol (A2C):   72.1 ml 49.55 ml/m  RA Volume:   30.50 ml  20.96 ml/m LA Vol (A4C):   49.3 ml 33.88 ml/m LA Biplane Vol: 59.7 ml 41.02 ml/m  AORTIC VALVE                    PULMONIC VALVE AV Area (Vmax):    2.21 cm     PV Vmax:        0.56 m/s AV Area (Vmean):   1.79 cm     PV Vmean:       38.300 cm/s AV Area (VTI):     1.97 cm     PV VTI:         0.114 m AV Vmax:           139.00 cm/s  PV Peak grad:   1.3 mmHg AV Vmean:          94.400 cm/s  PV Mean grad:   1.0 mmHg AV VTI:            0.257 m      RVOT Peak grad: 6 mmHg AV Peak Grad:      7.7 mmHg AV Mean Grad:      4.0 mmHg LVOT Vmax:         97.90 cm/s LVOT Vmean:        53.700 cm/s LVOT VTI:          0.161 m LVOT/AV VTI ratio: 0.63  AORTA Ao Root diam: 2.90 cm MITRAL VALVE                TRICUSPID VALVE MV Area (PHT): 4.31 cm     TR Peak grad:   10.8 mmHg MV Peak grad:  9.6 mmHg     TR Vmax:        164.00 cm/s MV Mean grad:  5.0 mmHg MV Vmax:       1.55 m/s     SHUNTS MV Vmean:      103.0 cm/s   Systemic VTI:  0.16 m MV Decel Time: 176 msec     Systemic Diam: 2.00 cm MV E velocity: 8.05 cm/s    Pulmonic VTI:  0.252 m MV A velocity: 133.00 cm/s MV E/A ratio:  0.06 Donnelly Angelica Electronically signed by Donnelly Angelica Signature  Date/Time: 11/15/2021/5:13:23 PM    Final     ECHO 02/2021  1. Left ventricular ejection fraction, by estimation, is 55 to 60%. The  left ventricle has normal function. The left ventricle has no regional  wall motion abnormalities. Left ventricular diastolic parameters are  consistent with Grade III diastolic  dysfunction (restrictive).   2. Right ventricular systolic function is normal. The right ventricular  size is normal.   3. The mitral valve is myxomatous. Trivial mitral valve regurgitation.   4. The tricuspid valve is myxomatous. Tricuspid valve regurgitation is  mild to moderate.   5. The aortic valve is grossly normal. Aortic valve regurgitation is  mild. Mild to moderate aortic valve sclerosis/calcification is present,   without any evidence of aortic stenosis.   TELEMETRY reviewed by me: Normal sinus rhythm with rate 60s-70s, multiple periods of atrial tachycardia vs SVT on tele this morning, longest lasting 27 mins.   EKG reviewed by me: Initial EKG 3/7 at 1544 showed sinus rhythm rate 95 PVCs, LAFB, RBBB repeat EKG 3/8 showed sinus tach rate 137  ASSESSMENT AND PLAN:  The patient is an 88 year old female with a past medical history notable for HFpEF (LVEF 55-60% with myxomatous MV, mild AR with g3DD), history of MI 1999, COPD on 4 L at baseline, hypertension, rheumatoid arthritis who presented to [facility] ED with shortness of breath and productive cough for 2 to 3 days.  She presented hypoxic to the 70s and severely dyspneic requiring BiPAP initially.  Cardiology was consulted because of her elevated troponin and chest pain.  #Acute hypoxic respiratory failure 2/2 CAP/COPD (baseline 4 L) #Atypical chest pain The patient presents with a 3-day history of "feeling bad" with increased cough and central chest pain that is nonradiating, nonpleuritic, nonexertional and, intermittent and otherwise difficult to characterize by the patient.  The patient notably has a white count of 22, procalcitonin of 27.  Her troponins peaked at 175 related to demand ischemia from her bacterial infection and hypoxia and not ACS. She had another episode of chest pain with negative troponins and nonischemic EKG. Her chest pain reproducible on exam and worse with coughing. She has has multiple episodes of tachycardia (SVT vs atrial tachycardia) without evidence of atrial fibrillation or irregularity. No indication for anticoagulation at this time.  -Agree with treatment of primary infection -Wean oxygen as tolerated, on 4 L at baseline. - Continue '81mg'$  ASA daily.  - continue metoprolol - Continue lisinopril at '10mg'$  today  -s/p heparin gtt, ended 3/8 - Lasix 40 mg IV today -Continuous monitoring on telemetry while inpatient -Defer invasive  cardiac diagnostics.  # Paroxysmal SVT Frequent episodes of SVT since admission, appears to be consistent with atrial tachycardia (onset with PAC) that persist for as long as 30 minutes.  - Continue metoprolol 25 mg BID - Start amiodarone 400 mg BID for 5 gram load (6 days), then 200 mg daily  #HFpEF (LVEF 55-60% w G1dd, mild MR, aortic sclerosis) Persistently SOB despite treatment of infection.  - Lasix 40 mg IV today  #Hypertension #CAD with history of MI 1999 -Resume home blood pressure medications -She will need to establish care with cardiology after discharge.  #AKI, resolved Creatinine on admission 1.5 GFR 34, back to 0.82 and >60.    Signed: Andrez Grime, MD 11/18/2021, 8:07 AM [facility]

## 2021-11-18 NOTE — Progress Notes (Addendum)
PULMONOLOGY         Date: 11/18/2021,   MRN# [id number] [name] 05-Feb-1933     AdmissionWeight: 50.5 kg                 CurrentWeight: 50.5 kg   Referring physician: Dr Algis Liming   CHIEF COMPLAINT:   Acute on chronic hypoxemic respiratory failure   HISTORY OF PRESENT ILLNESS   This is an 88 yo F with hx of chronic lung disease with Asthma COPD overlap syndrome (ACOS) and chronic hypoxemia on 4L/min [place] as well as RA , GERD, HTN. She does have husband at bedside and he is able to help with details of history.   Patient reports that home RN was able to evaluate her and noted that she was in distress with labored breathing and asked for patient to be seen in ER.   On admission to ER she required BIPAP 10/5 40% FioO2.   She had chest imaging done via CXR with no grossly appreciable infiltrate with chronic opacification due to breat implants.  CT angio done Aug 2022 reviewed independently which does show chronic bronchitic changes with crowding of right basal bronchial segments.  No fibrotic changes to suggest RA-ILD.   Blood work with AKI and hypoalbuminemia with hypokalemia.    She received IV fluids and after this the CBC did show dilutional effects with anemia and other cell lines reduced in parallel which is expected. Patient denies having hemoptysis but did report loose stools unsure if there is blood in it.   She does have leukocytosis on arrival. She was placed on heparin due to notable troponin elevation and had cardiology evaluation while in ER.   11/16/21- patient is improved, I reviewed CT chest findings with her and husband at bedside.   11/17/21- patient with desaturation on mild exertion. She has consolidated LLL and would benefit from mcuolytic neb therapy as well a more aggressive BPH with metaneb to optimize for dc home. Revewed plan with Attending physician today. Met with daughter to review medical plan   11/18/21- patient is still feeling bad but  coughing up phlegm.   CXR is minimally improved.  Anemia is progressively worse over past year and she had anal cancer before , query regarding GI loss , she reports multiple stools daily. FOBT ordered twice but not done.  Steroids and lasix ongoing. She is bringing up copious phlegm on expectoration.          PAST MEDICAL HISTORY   Past Medical History:  Diagnosis Date   Anxiety    Asthma    CHF (congestive heart failure) (HCC)    COPD (chronic obstructive pulmonary disease) ([place])    Hypertension    Melanoma ([place])    scalp (2007) & leg (2013)   Myocardial infarction ([place]) 09/10/1997   no PCI or CABG   Osteoporosis    Rheumatoid arthritis ([place])    SVT (supraventricular tachycardia) ([place])      SURGICAL HISTORY   Past Surgical History:  Procedure Laterality Date   ABDOMINAL HYSTERECTOMY     BREAST SURGERY     CARDIAC SURGERY     CARPAL TUNNEL RELEASE     MELANOMA EXCISION  2007   scalp   MELANOMA EXCISION Right 2013   lower leg   SHOULDER SURGERY     TOTAL HIP REVISION  2015   VIDEO BRONCHOSCOPY Bilateral 04/08/2014   Procedure: VIDEO BRONCHOSCOPY WITHOUT FLUORO;  Surgeon: Juanito Doom, MD;  Location: [facility];  Service: Cardiopulmonary;  Laterality: Bilateral;     FAMILY HISTORY   History reviewed. No pertinent family history.   SOCIAL HISTORY   Social History   Tobacco Use   Smoking status: Former    Packs/day: 1.50    Years: 22.00    Pack years: 33.00    Types: Cigarettes    Quit date: 04/07/1974    Years since quitting: 47.6   Smokeless tobacco: Never  Substance Use Topics   Alcohol use: No   Drug use: No     MEDICATIONS    Home Medication:     Current Medication:  Current Facility-Administered Medications:    acetaminophen (TYLENOL) tablet 650 mg, 650 mg, Oral, Q6H PRN, Pudota, Kathe Becton, MD, 650 mg at 11/16/21 1810   acetylcysteine (MUCOMYST) 20 % nebulizer / oral solution 3 mL, 3 mL, Nebulization, TID, Denice Cardon, MD,  3 mL at 11/17/21 2123   aspirin EC tablet 81 mg, 81 mg, Oral, Daily, Tang, [employer], PA-C, 81 mg at 11/18/21 0816   azithromycin (ZITHROMAX) tablet 500 mg, 500 mg, Oral, QPM, Hongalgi, Anand D, MD, 500 mg at 11/17/21 1657   cefTRIAXone (ROCEPHIN) 1 g in sodium chloride 0.9 % 100 mL IVPB, 1 g, Intravenous, Q24H, Benita Gutter, RPH, Last Rate: 200 mL/hr at 11/17/21 1705, 1 g at 11/17/21 1705   citalopram (CELEXA) tablet 20 mg, 20 mg, Oral, Daily, Benita Gutter, RPH, 20 mg at 11/18/21 0816   fluticasone furoate-vilanterol (BREO ELLIPTA) 100-25 MCG/ACT 1 puff, 1 puff, Inhalation, Daily, 1 puff at 11/18/21 0816 **AND** umeclidinium bromide (INCRUSE ELLIPTA) 62.5 MCG/ACT 1 puff, 1 puff, Inhalation, Daily, Pudota, Kingsley P, MD, 1 puff at 11/18/21 0817   heparin injection 5,000 Units, 5,000 Units, Subcutaneous, Q8H, Kristopher Oppenheim, DO, 5,000 Units at 11/18/21 0615   hydrALAZINE (APRESOLINE) injection 10 mg, 10 mg, Intravenous, Q6H PRN, Vernell Leep D, MD, 10 mg at 11/17/21 1657   insulin aspart (novoLOG) injection 0-6 Units, 0-6 Units, Subcutaneous, TID WC, Hongalgi, Anand D, MD   ipratropium-albuterol (DUONEB) 0.5-2.5 (3) MG/3ML nebulizer solution 3 mL, 3 mL, Nebulization, Q4H PRN, Lanney Gins, Thanh Pomerleau, MD, 3 mL at 11/17/21 2123   lisinopril (ZESTRIL) tablet 10 mg, 10 mg, Oral, Daily, Tang, Lily Michelle, PA-C, 10 mg at 11/18/21 0816   melatonin tablet 5 mg, 5 mg, Oral, QHS PRN, Sharion Settler, NP, 5 mg at 11/17/21 2112   methylPREDNISolone sodium succinate (SOLU-MEDROL) 125 mg/2 mL injection 80 mg, 80 mg, Intravenous, Q24H, Thetis Schwimmer, MD, 80 mg at 11/18/21 0615   metoprolol tartrate (LOPRESSOR) tablet 25 mg, 25 mg, Oral, TID, Tang, Alanson Puls, PA-C, 25 mg at 11/18/21 0815   montelukast (SINGULAIR) tablet 10 mg, 10 mg, Oral, QHS, Pudota, Kingsley P, MD, 10 mg at 11/17/21 2112   nitroGLYCERIN (NITROSTAT) SL tablet 0.4 mg, 0.4 mg, Sublingual, Q5 min PRN, Pudota, Kathe Becton, MD   pantoprazole  (PROTONIX) EC tablet 40 mg, 40 mg, Oral, Daily, Benita Gutter, RPH, 40 mg at 11/18/21 0816   sucralfate (CARAFATE) tablet 1 g, 1 g, Oral, QID, Pudota, Kathe Becton, MD, 1 g at 11/18/21 0816    ALLERGIES   Celebrex [celecoxib] and Morphine and related     REVIEW OF SYSTEMS    Review of Systems:  Gen:  Denies  fever, sweats, chills weigh loss  HEENT: Denies blurred vision, double vision, ear pain, eye pain, hearing loss, nose bleeds, sore throat Cardiac:  No dizziness, chest pain or heaviness, chest  tightness,edema Resp:   Denies cough or sputum porduction, shortness of breath,wheezing, hemoptysis,  Gi: Denies swallowing difficulty, stomach pain, nausea or vomiting, diarrhea, constipation, bowel incontinence Gu:  Denies bladder incontinence, burning urine Ext:   Denies Joint pain, stiffness or swelling Skin: Denies  skin rash, easy bruising or bleeding or hives Endoc:  Denies polyuria, polydipsia , polyphagia or weight change Psych:   Denies depression, insomnia or hallucinations   Other:  All other systems negative   VS: BP (!) 155/69 (BP Location: Right Arm)    Pulse 63    Temp 98.3 F (36.8 C) (Oral)    Resp 17    Ht 5' (1.524 m)    Wt 50.5 kg    SpO2 96%    BMI 21.74 kg/m      PHYSICAL EXAM    GENERAL:NAD, no fevers, chills, no weakness no fatigue HEAD: Normocephalic, atraumatic.  EYES: Pupils equal, round, reactive to light. Extraocular muscles intact. No scleral icterus.  MOUTH: Moist mucosal membrane. Dentition intact. No abscess noted.  EAR, NOSE, THROAT: Clear without exudates. No external lesions.  NECK: Supple. No thyromegaly. No nodules. No JVD.  PULMONARY:reduced air entry bilaterally  CARDIOVASCULAR: S1 and S2. Regular rate and rhythm. No murmurs, rubs, or gallops. No edema. Pedal pulses 2+ bilaterally.  GASTROINTESTINAL: Soft, nontender, nondistended. No masses. Positive bowel sounds. No hepatosplenomegaly.  MUSCULOSKELETAL: No swelling, clubbing, or  edema. Range of motion full in all extremities.  NEUROLOGIC: Cranial nerves II through XII are intact. No gross focal neurological deficits. Sensation intact. Reflexes intact.  SKIN: No ulceration, lesions, rashes, or cyanosis. Skin warm and dry. Turgor intact.  PSYCHIATRIC: Mood, affect within normal limits. The patient is awake, alert and oriented x 3. Insight, judgment intact.       IMAGING    CT Angio Chest Pulmonary Embolism (PE) W or WO Contrast  Result Date: 11/15/2021 CLINICAL DATA:  Chronic respiratory failure EXAM: CT ANGIOGRAPHY CHEST WITH CONTRAST TECHNIQUE: Multidetector CT imaging of the chest was performed using the standard protocol during bolus administration of intravenous contrast. Multiplanar CT image reconstructions and MIPs were obtained to evaluate the vascular anatomy. RADIATION DOSE REDUCTION: This exam was performed according to the departmental dose-optimization program which includes automated exposure control, adjustment of the mA and/or kV according to patient size and/or use of iterative reconstruction technique. CONTRAST:  44m OMNIPAQUE IOHEXOL 300 MG/ML  SOLN COMPARISON:  04/11/2021 FINDINGS: Cardiovascular: Atherosclerotic calcifications of the thoracic aorta are noted. No aneurysmal dilatation or dissection is noted. Mild cardiac enlargement is seen. No pericardial effusion is seen. Coronary calcifications are noted. Pulmonary artery shows a normal branching pattern bilaterally. No intraluminal filling defect is identified. Mediastinum/Nodes: Thoracic inlet is within normal limits. No sizable hilar or mediastinal adenopathy is noted. The esophagus as visualized is within normal limits. Lungs/Pleura: Diffuse emphysematous changes are noted in the lungs bilaterally stable in appearance from the prior exam. Small effusion is noted on the left with lower lobe infiltrate new from the prior study. No sizable parenchymal nodules are seen. Upper Abdomen: Visualized upper  abdomen is within normal limits. Musculoskeletal: Degenerative changes of the thoracic spine are noted. No definitive rib abnormality is seen. Bilateral breast implants are seen with evidence of stable intracapsular rupture similar to that noted on the prior exam. Review of the MIP images confirms the above findings. IMPRESSION: No evidence of pulmonary emboli. New left lower lobe infiltrate with associated small effusion. Aortic Atherosclerosis (ICD10-I70.0) and Emphysema (ICD10-J43.9). Electronically Signed   By:  Inez Catalina M.D.   On: 11/15/2021 21:21   DG Chest Portable 1 View  Result Date: 11/14/2021 CLINICAL DATA:  Respiratory distress. EXAM: PORTABLE CHEST 1 VIEW COMPARISON:  Chest radiograph dated April 13, 2019 FINDINGS: The heart is enlarged. Atherosclerotic calcification of the aortic arch. Left lower lobe hazy opacity with silhouetting of the left costophrenic angle. Bilateral mild pleural/parenchymal scarring, unchanged. IMPRESSION: 1. Stable cardiomegaly. 2. Left lower lobe hazy opacity which may represent atelectasis or effusion. Underlying airspace disease can not be excluded. Electronically Signed   By: Keane Police D.O.   On: 11/14/2021 15:59   ECHOCARDIOGRAM COMPLETE  Result Date: 11/15/2021    ECHOCARDIOGRAM REPORT   Patient Name:   JAZELLE ACHEY [facility] Date of Exam: 11/15/2021 Medical Rec #:  [id number]        Height:       60.0 in Accession #:    [id number]       Weight:       111.3 lb Date of Birth:  [birth year]         BSA:          1.455 m Patient Age:    24 years         BP:           161/80 mmHg Patient Gender: F                HR:           93 bpm. Exam Location:  [facility] Procedure: 2D Echo, Cardiac Doppler and Color Doppler Indications:     Chest pain R07.9  History:         Patient has prior history of Echocardiogram examinations, most                  recent 02/23/2021. CHF, Previous Myocardial Infarction, COPD;                  Risk Factors:Hypertension. SVT.  Sonographer:     Sherrie Sport  Referring Phys:  [id number] [facility] P PUDOTA Diagnosing Phys: Donnelly Angelica  Sonographer Comments: Technically challenging study due to limited acoustic windows. Image acquisition challenging due to breast implants and Image acquisition challenging due to COPD. IMPRESSIONS  1. Left ventricular ejection fraction, by estimation, is 55 to 60%. The left ventricle has normal function. The left ventricle has no regional wall motion abnormalities. Left ventricular diastolic parameters are consistent with Grade I diastolic dysfunction (impaired relaxation).  2. Right ventricular systolic function is normal. The right ventricular size is not well visualized.  3. Left atrial size was moderately dilated.  4. The mitral valve is degenerative. Mild mitral valve regurgitation. No evidence of mitral stenosis. The mean mitral valve gradient is 5.0 mmHg.  5. The aortic valve is calcified. Aortic valve regurgitation is trivial. Aortic valve sclerosis/calcification is present, without any evidence of aortic stenosis. FINDINGS  Left Ventricle: Left ventricular ejection fraction, by estimation, is 55 to 60%. The left ventricle has normal function. The left ventricle has no regional wall motion abnormalities. The left ventricular internal cavity size was normal in size. There is  no left ventricular hypertrophy. Left ventricular diastolic parameters are consistent with Grade I diastolic dysfunction (impaired relaxation). Right Ventricle: The right ventricular size is not well visualized. Right vetricular wall thickness was not well visualized. Right ventricular systolic function is normal. Left Atrium: Left atrial size was moderately dilated. Right Atrium: Right atrial size was not well visualized. Pericardium: There is no evidence of  pericardial effusion. Mitral Valve: The mitral valve is degenerative in appearance. Mild mitral valve regurgitation. No evidence of mitral valve stenosis. MV peak gradient, 9.6 mmHg. The mean mitral valve  gradient is 5.0 mmHg. Tricuspid Valve: The tricuspid valve is normal in structure. Tricuspid valve regurgitation is trivial. Aortic Valve: The aortic valve is calcified. Aortic valve regurgitation is trivial. Aortic valve sclerosis/calcification is present, without any evidence of aortic stenosis. Aortic valve mean gradient measures 4.0 mmHg. Aortic valve peak gradient measures 7.7 mmHg. Aortic valve area, by VTI measures 1.97 cm. Pulmonic Valve: The pulmonic valve was not well visualized. Pulmonic valve regurgitation is not visualized. Aorta: The aortic root is normal in size and structure. IAS/Shunts: The interatrial septum was not well visualized.  LEFT VENTRICLE PLAX 2D LVIDd:         4.30 cm   Diastology LVIDs:         3.10 cm   LV e' medial:    4.03 cm/s LV PW:         1.10 cm   LV E/e' medial:  2.0 LV IVS:        1.10 cm   LV e' lateral:   7.34 cm/s LVOT diam:     2.00 cm   LV E/e' lateral: 1.1 LV SV:         51 LV SV Index:   35 LVOT Area:     3.14 cm  RIGHT VENTRICLE RV S prime:     11.20 cm/s TAPSE (M-mode): 2.0 cm LEFT ATRIUM             Index        RIGHT ATRIUM           Index LA diam:        3.60 cm 2.47 cm/m   RA Area:     14.90 cm LA Vol (A2C):   72.1 ml 49.55 ml/m  RA Volume:   30.50 ml  20.96 ml/m LA Vol (A4C):   49.3 ml 33.88 ml/m LA Biplane Vol: 59.7 ml 41.02 ml/m  AORTIC VALVE                    PULMONIC VALVE AV Area (Vmax):    2.21 cm     PV Vmax:        0.56 m/s AV Area (Vmean):   1.79 cm     PV Vmean:       38.300 cm/s AV Area (VTI):     1.97 cm     PV VTI:         0.114 m AV Vmax:           139.00 cm/s  PV Peak grad:   1.3 mmHg AV Vmean:          94.400 cm/s  PV Mean grad:   1.0 mmHg AV VTI:            0.257 m      RVOT Peak grad: 6 mmHg AV Peak Grad:      7.7 mmHg AV Mean Grad:      4.0 mmHg LVOT Vmax:         97.90 cm/s LVOT Vmean:        53.700 cm/s LVOT VTI:          0.161 m LVOT/AV VTI ratio: 0.63  AORTA Ao Root diam: 2.90 cm MITRAL VALVE                TRICUSPID  VALVE MV  Area (PHT): 4.31 cm     TR Peak grad:   10.8 mmHg MV Peak grad:  9.6 mmHg     TR Vmax:        164.00 cm/s MV Mean grad:  5.0 mmHg MV Vmax:       1.55 m/s     SHUNTS MV Vmean:      103.0 cm/s   Systemic VTI:  0.16 m MV Decel Time: 176 msec     Systemic Diam: 2.00 cm MV E velocity: 8.05 cm/s    Pulmonic VTI:  0.252 m MV A velocity: 133.00 cm/s MV E/A ratio:  0.06 Donnelly Angelica Electronically signed by Donnelly Angelica Signature Date/Time: 11/15/2021/5:13:23 PM    Final          ASSESSMENT/PLAN   Acute on chronic hypoxemic respiratory failure -etiology differential includes acute COPD exacerbation vs viral LRTI vs atypical pneumonia , vs acute blood loss anemia vs acute on chronic diastolic CHF - present on admission  - COVID19 negative   - supplemental O2 during my evaluation 5L/min  - nfectious workup for pneumonia as below -Respiratory viral panel-negative -serum fungitell -legionella ab -strep pneumoniae ur AG -Histoplasma Ur Ag -sputum resp cultures -reviewed pertinent imaging with patient today - ESR/CRP -severely elevated  -PT/OT for d/c planning  -please encourage patient to use incentive spirometer few times each hour while hospitalized.   Metaneb and mucomyst for BPH   Anemia    Trending down H/h     - fecal occult blood stool - patient reports loose stool      -currently on heparin - consider DC if not needed as cardiology thinks its unlikely ACS   Acute on chronic diastolic CHF    In context of AE COPD vs LRTI   -BNP-significantly elevated     - cardiology consult - appreciate input - Dr Corky Sox and Orinda Kenner     - s/p TTE - reviewed no severe findings   Acute exacerbation of COPD     -Wean down IV solumedrol reduced to 60 bid from 120    -Continue rocephin zithromax for now     -PRN duoneb   - Incruse ellipta   - Breo once daily    - IS and Flutter several times daily    Severe GERD   -Carafate     Thank you for allowing me to participate in the care of this  patient.  Patient has at least 1 severe active medical problem which is life threatening and is being managed during this evaluation  Patient/Family are satisfied with care plan and all questions have been answered.  This document was prepared using Dragon voice recognition software and may include unintentional dictation errors.     Ottie Glazier, M.D.  Division of [place]

## 2021-11-18 NOTE — Assessment & Plan Note (Addendum)
Atrial tachycardia Cardiology following and assisting with management Despite metoprolol, had sustained SVT on 3/11. Therefore amiodarone initiated.  Cardiology recommends amiodarone 200 Mg twice daily for 10 days and then 200 Mg once daily thereafter.  Continue metoprolol 25 Mg twice daily.   Likely all precipitated by respiratory issues.  Monitor on telemetry Improved, less frequent and much shorter duration episodes.

## 2021-11-18 NOTE — Assessment & Plan Note (Addendum)
Multifactorial due to very advanced age, frail physical health and multiple severe significant comorbidities. Palliative medicine input appreciated.  Full code.  Full scope treatment. Palliative care medicine to follow-up and include daughter in discussions.

## 2021-11-18 NOTE — Progress Notes (Signed)
PROGRESS NOTE   [name]  DJS:[id number]    DOB: [birth year]    DOA: 11/14/2021  PCP: Amy Barrios, MD   I have briefly reviewed patients previous medical records in [facility].  Chief Complaint  Patient presents with   Shortness of Breath    Hospital Course:  88 year old female with medical history significant for CAD, HTN, COPD, chronic respiratory failure with hypoxia on 4 L/min nasal cannula oxygen, rheumatoid arthritis, osteoporosis and GERD presented to the ED with complaints of dyspnea.  She reportedly saw her MD for left hip pain and was diagnosed with a crack in the hip bone and prescribed some meds.  Following taking the meds prescribed, she started experiencing worsening dyspnea.  This was associated with productive cough without fever or chest pain.  Home DuoNebs did not offer relief.  She was directed to the ED.  Admitted for acute on chronic respiratory failure with hypoxia due to suspected COPD exacerbation, community-acquired pneumonia.  Needed BiPAP overnight of admission. Patient and spouse declined SNF recommended by therapies and insisted on going home.  Still with significant dyspnea and hypoxia on minimal exertion, pulmonology following.  Persistent SVT/AT on 3/11, cardiology following   Assessment & Plan:  Principal Problem:   Acute and chronic respiratory failure with hypoxia ([place]) Active Problems:   COPD with acute exacerbation (HCC)   CAP (community acquired pneumonia)   Elevated troponin   PSVT (paroxysmal supraventricular tachycardia) (HCC)   AKI (acute kidney injury) ([place])   Left hip pain   Essential hypertension   Rheumatoid arthritis ([place])   Normocytic anemia   Hypokalemia   Assessment and Plan: * Acute and chronic respiratory failure with hypoxia ([place]) Suspected due to COPD exacerbation, possibly advanced and pneumonia. Needed BiPAP overnight of admission Pulmonology following RVP panel negative.  ESR 73 and CRP 27.7  significantly elevated. Rheumatoid factor 91.8?  Significance in the context of known RA.  Serum Fungitell is pending BNP 826 but down from 1878, 7 months prior.  Clinically does not look volume overloaded but given ongoing significant DOE and hypoxia, trial of IV Lasix 40 mg x 1. Procalcitonin 27.29 > 13.73, already on antibiotics. Supportive care with incentive spirometry. Reassess home oxygen requirement prior to discharge. Due to elevated D-dimer and concern for PE, obtained CTA chest, negative for PE. As per pulmonology, on nebulized Mucomyst 3 times daily followed by MetaNeb with saline to address left base consolidation.  Repeat chest x-ray without significant change  COPD with acute exacerbation (HCC) Complete course of antibiotic treatment for pneumonia. IV Solu-Medrol tapered to 80 mg every 24 hours. PCCM following.  CAP (community acquired pneumonia) Continue empirically started IV ceftriaxone and azithromycin Supportive care Follow extensive evaluation recommended by pulmonology. CTA chest 3/8 confirmed new left lower lobe infiltrate with associated small effusion. Day 5/5 antibiotics.  PSVT (paroxysmal supraventricular tachycardia) (HCC) Atrial tachycardia Patient had been having intermittent episodes of this and metoprolol had been uptitrated to 25 Mg 3 times daily. Despite above, this morning sustained SVT in the 120s-130s. Discussed with cardiology, initiated amiodarone 400 Mg twice daily x5 days followed by 200 Mg daily and reduced metoprolol to 25 Mg twice daily. Likely all precipitated by respiratory issues.  Monitor on telemetry  Elevated troponin Chest pain CAD Demand ischemia Chronic diastolic CHF PSVT Cardiology input appreciated and suspect demand ischemia in the context of underlying acute respiratory failure and infectious etiology. Clinically euvolemic but given degree of DOE and hypoxia, trial of IV Lasix  40 mg x 1. Musculoskeletal and reproducible  chest pain has resolved 2D echo: LVEF 55-60%, no regional wall motion abnormalities and grade 1 diastolic dysfunction.  AKI (acute kidney injury) ([place]) Presented with creatinine of 1.5.  Resolved.  Left hip pain Patient evaluated as outpatient and told to have some chronic, exact details not available. Has not complained of pain here. Outpatient follow-up.  Hypokalemia Replaced.  Magnesium 2.2.  Normocytic anemia Hemoglobin has dropped from 10 to 8 g in the absence of overt bleeding. Hemoglobin now up to 9.3.  Rheumatoid arthritis ([place]) Reportedly in remission. Rheumatoid factor elevated, unclear significance.  Also has elevated ESR CRP.  Essential hypertension Holding lisinopril and HCTZ due to elevated creatinine. Now on metoprolol.  Mild fluctuating BPs.   Body mass index is 21.74 kg/m.  Nutritional Status        Pressure Ulcer:     DVT prophylaxis: heparin injection 5,000 Units Start: 11/16/21 0600 SCDs Start: 11/15/21 2130 SCDs Start: 11/14/21 2031     Code Status: Full Code:  Family Communication: Spouse and daughter at bedside. Disposition:  Status is: Inpatient Remains inpatient appropriate because: Severity of illness, IV meds    Consultants:   Pulmonology Cardiology  Procedures:     Antimicrobials:   As above   Subjective:  Per RN report, dyspnea on minimal exertion even on moving from bed to bedside commode or while eating.  Same confirmed by patient and family at bedside.  No further chest pains.  To the Cardiologists she did mention palpitations.  Objective:   Vitals:   11/18/21 0724 11/18/21 0955 11/18/21 1132 11/18/21 1502  BP: (!) 155/69  (!) 177/72   Pulse: 63 65 67 68  Resp: 17 16 18 18   Temp: 98.3 F (36.8 C)  98 F (36.7 C)   TempSrc: Oral     SpO2: 96% 95% 95% 92%  Weight:      Height:        General exam: Elderly female, small built and frail, chronically ill looking, lying supine in bed undergoing nebulization by  RT.  Did not appear in any distress. Respiratory system: Globally distant breath sounds and occasional basal crackles but otherwise clear to auscultation.  No wheezing or rhonchi appreciated.  No dyspnea noted while speaking. Cardiovascular system: S1 & S2 heard, RRR. No JVD, murmurs, rubs, gallops or clicks. No pedal edema.  Telemetry personally reviewed: Sinus rhythm changed to SVT at 9:48 AM this morning in the 130s. Gastrointestinal system: Abdomen is nondistended, soft and nontender. No organomegaly or masses felt. Normal bowel sounds heard. Central nervous system: Alert and oriented. No focal neurological deficits. Extremities: Symmetric 5 x 5 power. Skin: No rashes, lesions or ulcers Psychiatry: Judgement and insight appear impaired. Mood & affect appropriate.     Data Reviewed:   I have personally reviewed following labs and imaging studies   CBC: Recent Labs  Lab 11/15/21 0418 11/16/21 0440 11/17/21 1008  WBC 14.4* 11.2* 9.8  HGB 8.0* 8.2* 9.3*  HCT 25.1* 26.6* 29.9*  MCV 83.1 84.7 83.5  PLT 302 314 546    Basic Metabolic Panel: Recent Labs  Lab 11/14/21 1545 11/15/21 0418 11/16/21 0440 11/17/21 1008 11/18/21 0431  NA 134* 135 140 137 139  K 3.5 3.2* 3.7 3.1* 4.2  CL 96* 100 107 104 105  CO2 25 27 27 26 29   GLUCOSE 156* 174* 105* 118* 108*  BUN 58* 45* 34* 35* 34*  CREATININE 1.50* 1.02* 0.83 0.82 0.82  CALCIUM  9.2 8.2* 8.5* 8.6* 8.8*  MG  --  2.0  --   --  2.2    Liver Function Tests: Recent Labs  Lab 11/14/21 1545 11/15/21 0418  AST 35 23  ALT 17 13  ALKPHOS 66 54  BILITOT 0.6 0.3  PROT 7.4 5.9*  ALBUMIN 3.6 2.8*    CBG: Recent Labs  Lab 11/17/21 1944 11/18/21 0727 11/18/21 1131  GLUCAP 162* 117* 151*    Microbiology Studies:   Recent Results (from the past 240 hour(s))  Blood culture (routine x 2)     Status: None (Preliminary result)   Collection Time: 11/14/21  3:35 PM   Specimen: BLOOD  Result Value Ref Range Status   Specimen  Description BLOOD BLRA  Final   Special Requests BOTTLES DRAWN AEROBIC AND ANAEROBIC BCAV  Final   Culture   Final    NO GROWTH 4 DAYS Performed at [facility], [address] [place], [place] [address]    Report Status PENDING  Incomplete  Blood culture (routine x 2)     Status: None (Preliminary result)   Collection Time: 11/14/21  3:40 PM   Specimen: BLOOD  Result Value Ref Range Status   Specimen Description BLOOD RAC  Final   Special Requests BOTTLES DRAWN AEROBIC AND ANAEROBIC BCAV  Final   Culture   Final    NO GROWTH 4 DAYS Performed at [facility], [address], [place], [place] [address]    Report Status PENDING  Incomplete  Resp Panel by RT-PCR (Flu A&B, Covid) Nasopharyngeal Swab     Status: None   Collection Time: 11/14/21  3:45 PM   Specimen: Nasopharyngeal Swab; Nasopharyngeal(NP) swabs in vial transport medium  Result Value Ref Range Status   SARS Coronavirus 2 by RT PCR NEGATIVE NEGATIVE Final    Comment: (NOTE) SARS-CoV-2 target nucleic acids are NOT DETECTED.  The SARS-CoV-2 RNA is generally detectable in upper respiratory specimens during the acute phase of infection. The lowest concentration of SARS-CoV-2 viral copies this assay can detect is 138 copies/mL. A negative result does not preclude SARS-Cov-2 infection and should not be used as the sole basis for treatment or other patient management decisions. A negative result may occur with  improper specimen collection/handling, submission of specimen other than nasopharyngeal swab, presence of viral mutation(s) within the areas targeted by this assay, and inadequate number of viral copies(<138 copies/mL). A negative result must be combined with clinical observations, patient history, and epidemiological information. The expected result is Negative.  Fact Sheet for Patients:  EntrepreneurPulse.com.au  Fact Sheet for Healthcare Providers:   IncredibleEmployment.be  This test is no t yet approved or cleared by the [place] FDA and  has been authorized for detection and/or diagnosis of SARS-CoV-2 by FDA under an Emergency Use Authorization (EUA). This EUA will remain  in effect (meaning this test can be used) for the duration of the COVID-19 declaration under Section 564(b)(1) of the Act, 21 U.S.C.section 360bbb-3(b)(1), unless the authorization is terminated  or revoked sooner.       Influenza A by PCR NEGATIVE NEGATIVE Final   Influenza B by PCR NEGATIVE NEGATIVE Final    Comment: (NOTE) The Xpert Xpress SARS-CoV-2/FLU/RSV plus assay is intended as an aid in the diagnosis of influenza from Nasopharyngeal swab specimens and should not be used as a sole basis for treatment. Nasal washings and aspirates are unacceptable for Xpert Xpress SARS-CoV-2/FLU/RSV testing.  Fact Sheet for Patients: EntrepreneurPulse.com.au  Fact Sheet for  Healthcare Providers: IncredibleEmployment.be  This test is not yet approved or cleared by the [place] and has been authorized for detection and/or diagnosis of SARS-CoV-2 by FDA under an Emergency Use Authorization (EUA). This EUA will remain in effect (meaning this test can be used) for the duration of the COVID-19 declaration under Section 564(b)(1) of the Act, 21 U.S.C. section 360bbb-3(b)(1), unless the authorization is terminated or revoked.  Performed at [facility], [place], [place] [address]   Respiratory (~20 pathogens) panel by PCR     Status: None   Collection Time: 11/15/21  5:30 PM   Specimen: Nasopharyngeal Swab; Respiratory  Result Value Ref Range Status   Adenovirus NOT DETECTED NOT DETECTED Final   Coronavirus 229E NOT DETECTED NOT DETECTED Final    Comment: (NOTE) The Coronavirus on the Respiratory Panel, DOES NOT test for the novel  Coronavirus (2019 nCoV)     Coronavirus HKU1 NOT DETECTED NOT DETECTED Final   Coronavirus NL63 NOT DETECTED NOT DETECTED Final   Coronavirus OC43 NOT DETECTED NOT DETECTED Final   Metapneumovirus NOT DETECTED NOT DETECTED Final   Rhinovirus / Enterovirus NOT DETECTED NOT DETECTED Final   Influenza A NOT DETECTED NOT DETECTED Final   Influenza B NOT DETECTED NOT DETECTED Final   Parainfluenza Virus 1 NOT DETECTED NOT DETECTED Final   Parainfluenza Virus 2 NOT DETECTED NOT DETECTED Final   Parainfluenza Virus 3 NOT DETECTED NOT DETECTED Final   Parainfluenza Virus 4 NOT DETECTED NOT DETECTED Final   Respiratory Syncytial Virus NOT DETECTED NOT DETECTED Final   Bordetella pertussis NOT DETECTED NOT DETECTED Final   Bordetella Parapertussis NOT DETECTED NOT DETECTED Final   Chlamydophila pneumoniae NOT DETECTED NOT DETECTED Final   Mycoplasma pneumoniae NOT DETECTED NOT DETECTED Final    Comment: Performed at [facility], [place]. [address] [place], [place] [address]    Radiology Studies:  DG Chest Port 1 View  Result Date: 11/18/2021 CLINICAL DATA:  Follow-up pneumonia. EXAM: PORTABLE CHEST 1 VIEW COMPARISON:  11/14/2021 and prior radiographs FINDINGS: UPPER limits normal heart size again noted. LEFT retrocardiac LOWER lung opacity is relatively unchanged. No pneumothorax or large pleural effusion identified. No significant changes are identified. IMPRESSION: Unchanged LEFT LOWER lung opacity which may represent atelectasis/effusion/pneumonia. Electronically Signed   By: Margarette Canada M.D.   On: 11/18/2021 10:28    Scheduled Meds:    acetylcysteine  3 mL Nebulization BID   amiodarone  400 mg Oral BID   aspirin EC  81 mg Oral Daily   azithromycin  500 mg Oral QPM   citalopram  20 mg Oral Daily   fluticasone furoate-vilanterol  1 puff Inhalation Daily   And   umeclidinium bromide  1 puff Inhalation Daily   heparin  5,000 Units Subcutaneous Q8H   insulin aspart  0-6 Units Subcutaneous TID WC    ipratropium-albuterol  3 mL Nebulization TID   [START ON 11/19/2021] methylPREDNISolone (SOLU-MEDROL) injection  60 mg Intravenous Q24H   metoprolol tartrate  25 mg Oral BID   montelukast  10 mg Oral QHS   pantoprazole  40 mg Oral Daily   sucralfate  1 g Oral QID    Continuous Infusions:    cefTRIAXone (ROCEPHIN)  IV 1 g (11/17/21 1705)     LOS: 4 days     Vernell Leep, MD,  FACP, [facility], [facility], [facility] (Care Management Physician Certified) [place]  To contact the attending provider  between 7A-7P or the covering provider during after hours 7P-7A, please log into the web site [email or web address] and access using universal [place] password for that web site. If you do not have the password, please call the hospital operator.  11/18/2021, 4:08 PM

## 2021-11-19 DIAGNOSIS — I5032 Chronic diastolic (congestive) heart failure: Secondary | ICD-10-CM

## 2021-11-19 DIAGNOSIS — J189 Pneumonia, unspecified organism: Secondary | ICD-10-CM

## 2021-11-19 LAB — CBC
HCT: 26.9 % — ABNORMAL LOW (ref 36.0–46.0)
Hemoglobin: 8.4 g/dL — ABNORMAL LOW (ref 12.0–15.0)
MCH: 25.7 pg — ABNORMAL LOW (ref 26.0–34.0)
MCHC: 31.2 g/dL (ref 30.0–36.0)
MCV: 82.3 fL (ref 80.0–100.0)
Platelets: 357 10*3/uL (ref 150–400)
RBC: 3.27 MIL/uL — ABNORMAL LOW (ref 3.87–5.11)
RDW: 14.6 % (ref 11.5–15.5)
WBC: 12.5 10*3/uL — ABNORMAL HIGH (ref 4.0–10.5)
nRBC: 0 % (ref 0.0–0.2)

## 2021-11-19 LAB — BASIC METABOLIC PANEL
Anion gap: 6 (ref 5–15)
BUN: 32 mg/dL — ABNORMAL HIGH (ref 8–23)
CO2: 29 mmol/L (ref 22–32)
Calcium: 8.5 mg/dL — ABNORMAL LOW (ref 8.9–10.3)
Chloride: 100 mmol/L (ref 98–111)
Creatinine, Ser: 0.92 mg/dL (ref 0.44–1.00)
GFR, Estimated: >60 mL/min (ref >60)
Glucose, Bld: 101 mg/dL — ABNORMAL HIGH (ref 70–99)
Potassium: 3.6 mmol/L (ref 3.5–5.1)
Sodium: 135 mmol/L (ref 135–145)

## 2021-11-19 LAB — GLUCOSE, CAPILLARY
Glucose-Capillary: 113 mg/dL — ABNORMAL HIGH (ref 70–99)
Glucose-Capillary: 111 mg/dL — ABNORMAL HIGH (ref 70–99)
Glucose-Capillary: 159 mg/dL — ABNORMAL HIGH (ref 70–99)
Glucose-Capillary: 105 mg/dL — ABNORMAL HIGH (ref 70–99)

## 2021-11-19 LAB — CULTURE, BLOOD (ROUTINE X 2)
Culture: NO GROWTH
Culture: NO GROWTH

## 2021-11-19 MED ORDER — SODIUM CHLORIDE 0.9% FLUSH
3.0000 mL | Freq: Two times a day (BID) | INTRAVENOUS | Status: DC
Start: 2021-11-19 — End: 2021-11-22
  Administered 2021-11-19 – 2021-11-22 (×7): 3 mL via INTRAVENOUS

## 2021-11-19 MED ORDER — AMIODARONE HCL 200 MG PO TABS
200.0000 mg | ORAL_TABLET | Freq: Two times a day (BID) | ORAL | Status: DC
  Administered 2021-11-19 – 2021-11-22 (×7): 200 mg via ORAL
  Filled 2021-11-19 (×7): qty 1

## 2021-11-19 MED ORDER — HYDROCHLOROTHIAZIDE 12.5 MG PO TABS
12.5000 mg | ORAL_TABLET | Freq: Every day | ORAL | Status: DC
  Administered 2021-11-19 – 2021-11-22 (×4): 12.5 mg via ORAL
  Filled 2021-11-19 (×4): qty 1

## 2021-11-19 MED ORDER — LISINOPRIL 20 MG PO TABS
20.0000 mg | ORAL_TABLET | Freq: Every day | ORAL | Status: DC
  Administered 2021-11-19: 20 mg via ORAL
  Filled 2021-11-19: qty 1

## 2021-11-19 MED ORDER — IBUPROFEN 400 MG PO TABS
400.0000 mg | ORAL_TABLET | Freq: Once | ORAL | Status: AC
  Administered 2021-11-19: 400 mg via ORAL
  Filled 2021-11-19: qty 1

## 2021-11-19 MED ORDER — POTASSIUM CHLORIDE CRYS ER 20 MEQ PO TBCR
40.0000 meq | EXTENDED_RELEASE_TABLET | Freq: Once | ORAL | Status: AC
Start: 2021-11-19 — End: 2021-11-19
  Administered 2021-11-19: 40 meq via ORAL
  Filled 2021-11-19: qty 2

## 2021-11-19 MED ORDER — METHYLPREDNISOLONE SODIUM SUCC 40 MG IJ SOLR
40.0000 mg | INTRAMUSCULAR | Status: DC
  Administered 2021-11-20: 40 mg via INTRAVENOUS
  Filled 2021-11-19: qty 1

## 2021-11-19 NOTE — Progress Notes (Signed)
PROGRESS NOTE   [name]  EKC:[id number]    DOB: Jan 16, 1933    DOA: 11/14/2021  PCP: [name], MD   I have briefly reviewed patients previous medical records in [facility].  Chief Complaint  Patient presents with   Shortness of Breath    Hospital Course:  88 year old female with medical history significant for CAD, HTN, COPD, chronic respiratory failure with hypoxia on 4 L/min nasal cannula oxygen, rheumatoid arthritis, osteoporosis and GERD presented to the ED with complaints of dyspnea.  She reportedly saw her MD for left hip pain and was diagnosed with a crack in the hip bone and prescribed some meds.  Following taking the meds prescribed, she started experiencing worsening dyspnea.  This was associated with productive cough without fever or chest pain.  Home DuoNebs did not offer relief.  She was directed to the ED.  Admitted for acute on chronic respiratory failure with hypoxia due to suspected COPD exacerbation, community-acquired pneumonia.  Needed BiPAP overnight of admission. Patient and spouse declined SNF recommended by therapies and insisted on going home.  Had significant dyspnea and hypoxia on minimal exertion, pulmonology following.  Persistent SVT/AT on 3/11, cardiology following.  Overall improving.  Hopeful DC home 3/13.   Assessment & Plan:  Principal Problem:   Acute and chronic respiratory failure with hypoxia (HCC) Active Problems:   COPD with acute exacerbation (HCC)   CAP (community acquired pneumonia)   PSVT (paroxysmal supraventricular tachycardia) (HCC)   Chronic diastolic CHF (congestive heart failure) (HCC)   Elevated troponin   AKI (acute kidney injury) ([place])   Essential hypertension   Left hip pain   Failure to thrive in adult   Rheumatoid arthritis (HCC)   Normocytic anemia   Hypokalemia   Assessment and Plan: * Acute and chronic respiratory failure with hypoxia (HCC) Suspected due to COPD exacerbation, possibly advanced  and pneumonia. Needed BiPAP overnight of admission Pulmonology following RVP panel negative.  ESR 73 and CRP 27.7 significantly elevated. Rheumatoid factor 91.8?  Significance in the context of known RA.  Serum Fungitell is pending BNP 826 but down from 1878, 7 months prior.  Clinically does not look volume overloaded but given ongoing significant DOE and hypoxia, given a dose of IV Lasix 40 mg x 1 with improvement. Procalcitonin 27.29 > 13.73, already on antibiotics. Supportive care with incentive spirometry. Reassess home oxygen requirement prior to discharge. Due to elevated D-dimer and concern for PE, obtained CTA chest, negative for PE. As per pulmonology, on nebulized Mucomyst 3 times daily followed by MetaNeb with saline to address left base consolidation.  Repeat chest x-ray without significant change Overall has improved in the last 24 hours, dyspnea better, saturating in the mid to upper 90s on 5 L and may be able to wean down oxygen.  COPD with acute exacerbation ([place]) Completed 5-day course of IV antibiotics (azithromycin, cefepime >ceftriaxone) for pneumonia. IV Solu-Medrol tapered to 80 mg every 24 hours. PCCM following.  CAP (community acquired pneumonia) Continue empirically started IV ceftriaxone and azithromycin Supportive care Follow extensive evaluation recommended by pulmonology. CTA chest 3/8 confirmed new left lower lobe infiltrate with associated small effusion. Completed 5-day course of IV antibiotics.  PSVT (paroxysmal supraventricular tachycardia) ([place]) Atrial tachycardia Cardiology following and assisting with management Despite metoprolol, had sustained SVT on 3/11. Therefore amiodarone initiated.  Cardiology recommends amiodarone 200 Mg twice daily for 10 days and then 200 Mg once daily thereafter.  Continue metoprolol 25 Mg twice daily.  Likely all precipitated by respiratory issues.  Monitor on telemetry Improved and no further SVT over the last 24  hours.  Chronic diastolic CHF (congestive heart failure) (HCC) Her persistent dyspnea despite treatment for pneumonia and COPD. S/p IV Lasix 40 mg x 1 with improvement. Now resumed home HCTZ 12.5 Mg daily.  Elevated troponin Chest pain CAD Demand ischemia Chronic diastolic CHF PSVT Cardiology input appreciated and suspect demand ischemia in the context of underlying acute respiratory failure and infectious etiology. Clinically euvolemic but given degree of DOE and hypoxia, trial of IV Lasix 40 mg x 1. Musculoskeletal and reproducible chest pain has resolved 2D echo: LVEF 55-60%, no regional wall motion abnormalities and grade 1 diastolic dysfunction.  AKI (acute kidney injury) ([place]) Presented with creatinine of 1.5.  Resolved.  Essential hypertension Now on metoprolol 25 Mg twice daily Lisinopril increased to 20 Mg daily Resumed HCTZ 12.5 Mg daily.  Left hip pain Patient evaluated as outpatient and told to have some chronic, exact details not available. Has not complained of pain here. Outpatient follow-up.  Failure to thrive in adult Multifactorial due to very advanced age, frail physical health and multiple severe significant comorbidities. Extensively discussed with patient, spouse and daughter at bedside, they are agreeable to a palliative care consult to address goals of care discussion.  Hypokalemia Replaced.  Magnesium 2.2.  Normocytic anemia Hemoglobin has dropped from 10 to 8 g in the absence of overt bleeding. Hemoglobin now up to 9.3.  Rheumatoid arthritis ([place]) Reportedly in remission. Rheumatoid factor elevated, unclear significance.  Also has elevated ESR CRP.   Body mass index is 21.74 kg/m.  Nutritional Status        Pressure Ulcer:     DVT prophylaxis: heparin injection 5,000 Units Start: 11/16/21 0600 SCDs Start: 11/15/21 2130 SCDs Start: 11/14/21 2031     Code Status: Full Code:  Family Communication: Spouse and daughter at  bedside. Disposition:  Status is: Inpatient Remains inpatient appropriate because: Severity of illness, IV meds    Consultants:   Pulmonology Cardiology  Procedures:     Antimicrobials:   As above   Subjective:  Around midnight, Tylenol did not help but resolved after ibuprofen.  Dyspnea much improved.  Able to bring up more phlegm which she describes as yucky.  Overall feels better.  Does not want to go home today due to bad weather.  Objective:   Vitals:   11/19/21 0426 11/19/21 0750 11/19/21 0823 11/19/21 1156  BP: (!) 153/69 (!) 175/77  (!) 186/73  Pulse: (!) 58 (!) 59 (!) 56 (!) 56  Resp: 18 19 16 19   Temp: (!) 97.3 F (36.3 C) 98.4 F (36.9 C)  97.6 F (36.4 C)  TempSrc:      SpO2: 94% 97% 93% 97%  Weight:      Height:        General exam: Elderly female, small built and frail, chronically ill looking, lying supine in bed.  Looks comfortable and in great spirits this morning. Respiratory system: Few left basal crackles.  Otherwise clear to auscultation.  Distant breath sounds likely her baseline due to COPD.  No increased work of breathing. Cardiovascular system: S1 & S2 heard, RRR. No JVD, murmurs, rubs, gallops or clicks. No pedal edema.  Telemetry personally reviewed: SVT aborted at 10:21 AM on 3/11 and has been in SR-SB in the 50s since then.  Had a 11 beat NSVT at around 8:55 PM last night. Gastrointestinal system: Abdomen is nondistended, soft and nontender.  No organomegaly or masses felt. Normal bowel sounds heard. Central nervous system: Alert and oriented. No focal neurological deficits. Extremities: Symmetric 5 x 5 power. Skin: No rashes, lesions or ulcers Psychiatry: Judgement and insight appear impaired. Mood & affect appropriate.     Data Reviewed:   I have personally reviewed following labs and imaging studies   CBC: Recent Labs  Lab 11/16/21 0440 11/17/21 1008 11/19/21 0420  WBC 11.2* 9.8 12.5*  HGB 8.2* 9.3* 8.4*  HCT 26.6* 29.9* 26.9*   MCV 84.7 83.5 82.3  PLT 314 365 656    Basic Metabolic Panel: Recent Labs  Lab 11/15/21 0418 11/16/21 0440 11/17/21 1008 11/18/21 0431 11/19/21 0420  NA 135 140 137 139 135  K 3.2* 3.7 3.1* 4.2 3.6  CL 100 107 104 105 100  CO2 27 27 26 29 29   GLUCOSE 174* 105* 118* 108* 101*  BUN 45* 34* 35* 34* 32*  CREATININE 1.02* 0.83 0.82 0.82 0.92  CALCIUM 8.2* 8.5* 8.6* 8.8* 8.5*  MG 2.0  --   --  2.2  --     Liver Function Tests: Recent Labs  Lab 11/14/21 1545 11/15/21 0418  AST 35 23  ALT 17 13  ALKPHOS 66 54  BILITOT 0.6 0.3  PROT 7.4 5.9*  ALBUMIN 3.6 2.8*    CBG: Recent Labs  Lab 11/18/21 1949 11/19/21 0807 11/19/21 1152  GLUCAP 171* 113* 111*    Microbiology Studies:   Recent Results (from the past 240 hour(s))  Blood culture (routine x 2)     Status: None   Collection Time: 11/14/21  3:35 PM   Specimen: BLOOD  Result Value Ref Range Status   Specimen Description BLOOD BLRA  Final   Special Requests BOTTLES DRAWN AEROBIC AND ANAEROBIC BCAV  Final   Culture   Final    NO GROWTH 5 DAYS Performed at [facility], [address] [place], [place] [address]    Report Status 11/19/2021 FINAL  Final  Blood culture (routine x 2)     Status: None   Collection Time: 11/14/21  3:40 PM   Specimen: BLOOD  Result Value Ref Range Status   Specimen Description BLOOD RAC  Final   Special Requests BOTTLES DRAWN AEROBIC AND ANAEROBIC BCAV  Final   Culture   Final    NO GROWTH 5 DAYS Performed at [facility], [address] [place], [place] [address]    Report Status 11/19/2021 FINAL  Final  Resp Panel by RT-PCR (Flu A&B, Covid) Nasopharyngeal Swab     Status: None   Collection Time: 11/14/21  3:45 PM   Specimen: Nasopharyngeal Swab; Nasopharyngeal(NP) swabs in vial transport medium  Result Value Ref Range Status   SARS Coronavirus 2 by RT PCR NEGATIVE NEGATIVE Final    Comment: (NOTE) SARS-CoV-2 target nucleic acids are NOT DETECTED.  The  SARS-CoV-2 RNA is generally detectable in upper respiratory specimens during the acute phase of infection. The lowest concentration of SARS-CoV-2 viral copies this assay can detect is 138 copies/mL. A negative result does not preclude SARS-Cov-2 infection and should not be used as the sole basis for treatment or other patient management decisions. A negative result may occur with  improper specimen collection/handling, submission of specimen other than nasopharyngeal swab, presence of viral mutation(s) within the areas targeted by this assay, and inadequate number of viral copies(<138 copies/mL). A negative result must be combined with clinical observations, patient history, and epidemiological information. The expected result is Negative.  Fact Sheet for Patients:  EntrepreneurPulse.com.au  Fact Sheet for Healthcare Providers:  IncredibleEmployment.be  This test is no t yet approved or cleared by the [place] FDA and  has been authorized for detection and/or diagnosis of SARS-CoV-2 by FDA under an Emergency Use Authorization (EUA). This EUA will remain  in effect (meaning this test can be used) for the duration of the COVID-19 declaration under Section 564(b)(1) of the Act, 21 U.S.C.section 360bbb-3(b)(1), unless the authorization is terminated  or revoked sooner.       Influenza A by PCR NEGATIVE NEGATIVE Final   Influenza B by PCR NEGATIVE NEGATIVE Final    Comment: (NOTE) The Xpert Xpress SARS-CoV-2/FLU/RSV plus assay is intended as an aid in the diagnosis of influenza from Nasopharyngeal swab specimens and should not be used as a sole basis for treatment. Nasal washings and aspirates are unacceptable for Xpert Xpress SARS-CoV-2/FLU/RSV testing.  Fact Sheet for Patients: EntrepreneurPulse.com.au  Fact Sheet for Healthcare Providers: IncredibleEmployment.be  This test is not yet approved or  cleared by the [place] FDA and has been authorized for detection and/or diagnosis of SARS-CoV-2 by FDA under an Emergency Use Authorization (EUA). This EUA will remain in effect (meaning this test can be used) for the duration of the COVID-19 declaration under Section 564(b)(1) of the Act, 21 U.S.C. section 360bbb-3(b)(1), unless the authorization is terminated or revoked.  Performed at [facility], [place], [place] [address]   Respiratory (~20 pathogens) panel by PCR     Status: None   Collection Time: 11/15/21  5:30 PM   Specimen: Nasopharyngeal Swab; Respiratory  Result Value Ref Range Status   Adenovirus NOT DETECTED NOT DETECTED Final   Coronavirus 229E NOT DETECTED NOT DETECTED Final    Comment: (NOTE) The Coronavirus on the Respiratory Panel, DOES NOT test for the novel  Coronavirus (2019 nCoV)    Coronavirus HKU1 NOT DETECTED NOT DETECTED Final   Coronavirus NL63 NOT DETECTED NOT DETECTED Final   Coronavirus OC43 NOT DETECTED NOT DETECTED Final   Metapneumovirus NOT DETECTED NOT DETECTED Final   Rhinovirus / Enterovirus NOT DETECTED NOT DETECTED Final   Influenza A NOT DETECTED NOT DETECTED Final   Influenza B NOT DETECTED NOT DETECTED Final   Parainfluenza Virus 1 NOT DETECTED NOT DETECTED Final   Parainfluenza Virus 2 NOT DETECTED NOT DETECTED Final   Parainfluenza Virus 3 NOT DETECTED NOT DETECTED Final   Parainfluenza Virus 4 NOT DETECTED NOT DETECTED Final   Respiratory Syncytial Virus NOT DETECTED NOT DETECTED Final   Bordetella pertussis NOT DETECTED NOT DETECTED Final   Bordetella Parapertussis NOT DETECTED NOT DETECTED Final   Chlamydophila pneumoniae NOT DETECTED NOT DETECTED Final   Mycoplasma pneumoniae NOT DETECTED NOT DETECTED Final    Comment: Performed at [facility], [place]. [address], [place], [place] [address]    Radiology Studies:  DG Chest Port 1 View  Result Date: 11/18/2021 CLINICAL DATA:  Follow-up  pneumonia. EXAM: PORTABLE CHEST 1 VIEW COMPARISON:  11/14/2021 and prior radiographs FINDINGS: UPPER limits normal heart size again noted. LEFT retrocardiac LOWER lung opacity is relatively unchanged. No pneumothorax or large pleural effusion identified. No significant changes are identified. IMPRESSION: Unchanged LEFT LOWER lung opacity which may represent atelectasis/effusion/pneumonia. Electronically Signed   By: Margarette Canada M.D.   On: 11/18/2021 10:28    Scheduled Meds:    amiodarone  200 mg Oral BID   aspirin EC  81 mg Oral Daily   azithromycin  500 mg Oral QPM   citalopram  20 mg Oral Daily   fluticasone furoate-vilanterol  1 puff Inhalation Daily   And   umeclidinium bromide  1 puff Inhalation Daily   heparin  5,000 Units Subcutaneous Q8H   hydrochlorothiazide  12.5 mg Oral Daily   insulin aspart  0-6 Units Subcutaneous TID WC   lisinopril  20 mg Oral Daily   methylPREDNISolone (SOLU-MEDROL) injection  60 mg Intravenous Q24H   metoprolol tartrate  25 mg Oral BID   montelukast  10 mg Oral QHS   pantoprazole  40 mg Oral Daily   sucralfate  1 g Oral QID    Continuous Infusions:    cefTRIAXone (ROCEPHIN)  IV Stopped (11/18/21 1900)     LOS: 5 days     Vernell Leep, MD,  FACP, [facility], [facility], [facility] (Care Management Physician Certified) [place]  To contact the attending provider between 7A-7P or the covering provider during after hours 7P-7A, please log into the web site [email or web address] and access using universal [place] password for that web site. If you do not have the password, please call the hospital operator.  11/19/2021, 2:24 PM

## 2021-11-19 NOTE — Assessment & Plan Note (Signed)
Her persistent dyspnea despite treatment for pneumonia and COPD. ?S/p IV Lasix 40 mg x 1 with improvement. ?Now resumed home HCTZ 12.5 Mg daily. ?

## 2021-11-19 NOTE — Progress Notes (Signed)
[facility] NOTE       Patient ID: [name] MRN: [id number] DOB/AGE: 14-Apr-1933 88 y.o.  Admit date: 11/14/2021 Referring Physician Dr. Algis Liming Primary Physician Lloyd Huger  Primary Cardiologist in [place] (last seen 1 year ago)  Reason for Consultation atypical chest pain   HPI: The patient is an 88 year old female with a past medical history notable for HFpEF (LVEF 55-60% with myxomatous MV, mild AR with g3DD), history of MI 1999, COPD on 4 L at baseline, hypertension, rheumatoid arthritis who presented to [facility] ED with shortness of breath and productive cough for 2 to 3 days.  She presented hypoxic to the 70s and severely dyspneic requiring BiPAP initially, discovered to have pneumonia.  Cardiology was consulted because of her elevated troponin and chest pain.  Interval History: - No further episodes of SVT since starting amiodarone.  - Feels much better this morning.  - Worked with PT and felt well. Baseline oxygen requirement.   Review of systems complete and found to be negative unless listed above     Past Medical History:  Diagnosis Date   Anxiety    Asthma    CHF (congestive heart failure) (HCC)    COPD (chronic obstructive pulmonary disease) ([place])    Hypertension    Melanoma ([place])    scalp (2007) & leg (2013)   Myocardial infarction ([place]) 09/10/1997   no PCI or CABG   Osteoporosis    Rheumatoid arthritis (HCC)    SVT (supraventricular tachycardia) ([place])     Past Surgical History:  Procedure Laterality Date   ABDOMINAL HYSTERECTOMY     BREAST SURGERY     CARDIAC SURGERY     CARPAL TUNNEL RELEASE     MELANOMA EXCISION  2007   scalp   MELANOMA EXCISION Right 2013   lower leg   SHOULDER SURGERY     TOTAL HIP REVISION  2015   VIDEO BRONCHOSCOPY Bilateral 04/08/2014   Procedure: VIDEO BRONCHOSCOPY WITHOUT FLUORO;  Surgeon: Juanito Doom, MD;  Location: [facility] ENDOSCOPY;  Service: Cardiopulmonary;  Laterality: Bilateral;     Medications Prior to Admission  Medication Sig Dispense Refill Last Dose   acetaminophen (TYLENOL) 325 MG tablet Take 650 mg by mouth every 6 (six) hours as needed.   11/14/2021   albuterol (PROVENTIL) (2.5 MG/3ML) 0.083% nebulizer solution Inhale 3 mLs into the lungs every 6 (six) hours as needed.   11/14/2021   Cholecalciferol (VITAMIN D PO) Take 25 mcg by mouth daily.   11/14/2021   citalopram (CELEXA) 20 MG tablet Take 20 mg by mouth daily.   11/14/2021   fish oil-omega-3 fatty acids 1000 MG capsule Take 2 g by mouth daily.   11/14/2021   hydrochlorothiazide (HYDRODIURIL) 12.5 MG tablet Take 12.5 mg by mouth daily.   11/14/2021   ipratropium-albuterol (DUONEB) 0.5-2.5 (3) MG/3ML SOLN Take 3 mLs by nebulization every 6 (six) hours as needed.   11/13/2021   lisinopril (ZESTRIL) 40 MG tablet Take 40 mg by mouth daily.   11/14/2021   montelukast (SINGULAIR) 10 MG tablet Take 10 mg by mouth at bedtime.   11/13/2021   omeprazole (PRILOSEC) 20 MG capsule Take 20 mg by mouth 2 (two) times daily before a meal.   11/14/2021   sucralfate (CARAFATE) 1 g tablet Take 1 g by mouth 4 (four) times daily.   11/13/2021   traMADol (ULTRAM) 50 MG tablet Take 50 mg by mouth 2 (two) times daily as needed.   11/13/2021  TRELEGY ELLIPTA 100-62.5-25 MCG/INH AEPB Take 1 puff by mouth daily.   11/14/2021   acetaminophen-codeine (TYLENOL #3) 300-30 MG tablet Take 1 tablet by mouth every 4 (four) hours as needed. (Patient not taking: Reported on 04/20/2021)      alendronate (FOSAMAX) 70 MG tablet Take 70 mg by mouth once a week.   11/10/2021   bisoprolol (ZEBETA) 5 MG tablet Take 0.5 tablets (2.5 mg total) by mouth at bedtime. (Patient not taking: Reported on 11/14/2021) 30 tablet 0 Not Taking   diltiazem (CARDIZEM CD) 180 MG 24 hr capsule Take 1 capsule (180 mg total) by mouth daily. (Patient not taking: Reported on 11/14/2021) 30 capsule 0 Not Taking   furosemide (LASIX) 20 MG tablet Take 1 tablet (20 mg total) by mouth daily. (Patient not taking:  Reported on 11/14/2021) 30 tablet 0 Not Taking   nitroGLYCERIN (NITROSTAT) 0.4 MG SL tablet Place 1 tablet (0.4 mg total) under the tongue every 5 (five) minutes as needed for chest pain. 30 tablet 3 prn   potassium chloride SA (KLOR-CON) 20 MEQ tablet Take 1 tablet (20 mEq total) by mouth daily. (Patient not taking: Reported on 11/14/2021) 30 tablet 0 Not Taking   tizanidine (ZANAFLEX) 2 MG capsule Take 2 mg by mouth 3 (three) times daily. (Patient not taking: Reported on 11/14/2021)   Not Taking    Social History   Socioeconomic History   Marital status: Married    Spouse name: Not on file   Number of children: Not on file   Years of education: Not on file   Highest education level: Not on file  Occupational History   Not on file  Tobacco Use   Smoking status: Former    Packs/day: 1.50    Years: 22.00    Pack years: 33.00    Types: Cigarettes    Quit date: 04/07/1974    Years since quitting: 47.6   Smokeless tobacco: Never  Substance and Sexual Activity   Alcohol use: No   Drug use: No   Sexual activity: Never  Other Topics Concern   Not on file  Social History Narrative   Lives in [place].  Originally from [place].  Children live in [place].   Social Determinants of Health   Financial Resource Strain: Not on file  Food Insecurity: Not on file  Transportation Needs: Not on file  Physical Activity: Not on file  Stress: Not on file  Social Connections: Not on file  Intimate Partner Violence: Not on file    History reviewed. No pertinent family history.    Review of systems complete and found to be negative unless listed above    PHYSICAL EXAM General: Elderly appearing Caucasian female, well nourished, in no acute distress.  Sitting upright in PCU bed. HEENT:  Normocephalic and atraumatic. Neck:  No JVD.  Lungs: Normal work of breathing on O2 by nasal cannula.  Poor air movement, coarse breath sounds, no wheezes Heart: HRRR . Normal S1 and S2.  3/6 systolic  murmur heard at the RUSB and apex  Chest: anterior chest wall with tenderness to palpation Abdomen: Non-distended appearing.  Msk: Normal strength and tone for age. Extremities: Chronic skin discoloration in bilateral lower extremities likely 2/2 arterial insufficiency.  No edema. Neuro: Alert and oriented X 3. Psych:  Answers questions appropriately.   Labs:   Lab Results  Component Value Date   WBC 12.5 (H) 11/19/2021   HGB 8.4 (L) 11/19/2021   HCT 26.9 (L) 11/19/2021   MCV  82.3 11/19/2021   PLT 357 11/19/2021    Recent Labs  Lab 11/15/21 0418 11/16/21 0440 11/19/21 0420  NA 135   < > 135  K 3.2*   < > 3.6  CL 100   < > 100  CO2 27   < > 29  BUN 45*   < > 32*  CREATININE 1.02*   < > 0.92  CALCIUM 8.2*   < > 8.5*  PROT 5.9*  --   --   BILITOT 0.3  --   --   ALKPHOS 54  --   --   ALT 13  --   --   AST 23  --   --   GLUCOSE 174*   < > 101*   < > = values in this interval not displayed.    Lab Results  Component Value Date   TROPONINI <0.30 02/23/2013    No results found for: CHOL No results found for: HDL No results found for: LDLCALC No results found for: TRIG No results found for: CHOLHDL No results found for: LDLDIRECT    Radiology: CT Angio Chest Pulmonary Embolism (PE) W or WO Contrast  Result Date: 11/15/2021 CLINICAL DATA:  Chronic respiratory failure EXAM: CT ANGIOGRAPHY CHEST WITH CONTRAST TECHNIQUE: Multidetector CT imaging of the chest was performed using the standard protocol during bolus administration of intravenous contrast. Multiplanar CT image reconstructions and MIPs were obtained to evaluate the vascular anatomy. RADIATION DOSE REDUCTION: This exam was performed according to the departmental dose-optimization program which includes automated exposure control, adjustment of the mA and/or kV according to patient size and/or use of iterative reconstruction technique. CONTRAST:  22m OMNIPAQUE IOHEXOL 300 MG/ML  SOLN COMPARISON:  04/11/2021 FINDINGS:  Cardiovascular: Atherosclerotic calcifications of the thoracic aorta are noted. No aneurysmal dilatation or dissection is noted. Mild cardiac enlargement is seen. No pericardial effusion is seen. Coronary calcifications are noted. Pulmonary artery shows a normal branching pattern bilaterally. No intraluminal filling defect is identified. Mediastinum/Nodes: Thoracic inlet is within normal limits. No sizable hilar or mediastinal adenopathy is noted. The esophagus as visualized is within normal limits. Lungs/Pleura: Diffuse emphysematous changes are noted in the lungs bilaterally stable in appearance from the prior exam. Small effusion is noted on the left with lower lobe infiltrate new from the prior study. No sizable parenchymal nodules are seen. Upper Abdomen: Visualized upper abdomen is within normal limits. Musculoskeletal: Degenerative changes of the thoracic spine are noted. No definitive rib abnormality is seen. Bilateral breast implants are seen with evidence of stable intracapsular rupture similar to that noted on the prior exam. Review of the MIP images confirms the above findings. IMPRESSION: No evidence of pulmonary emboli. New left lower lobe infiltrate with associated small effusion. Aortic Atherosclerosis (ICD10-I70.0) and Emphysema (ICD10-J43.9). Electronically Signed   By: MInez CatalinaM.D.   On: 11/15/2021 21:21   DG Chest Port 1 View  Result Date: 11/18/2021 CLINICAL DATA:  Follow-up pneumonia. EXAM: PORTABLE CHEST 1 VIEW COMPARISON:  11/14/2021 and prior radiographs FINDINGS: UPPER limits normal heart size again noted. LEFT retrocardiac LOWER lung opacity is relatively unchanged. No pneumothorax or large pleural effusion identified. No significant changes are identified. IMPRESSION: Unchanged LEFT LOWER lung opacity which may represent atelectasis/effusion/pneumonia. Electronically Signed   By: JMargarette CanadaM.D.   On: 11/18/2021 10:28   DG Chest Portable 1 View  Result Date:  11/14/2021 CLINICAL DATA:  Respiratory distress. EXAM: PORTABLE CHEST 1 VIEW COMPARISON:  Chest radiograph dated April 13, 2019 FINDINGS:  The heart is enlarged. Atherosclerotic calcification of the aortic arch. Left lower lobe hazy opacity with silhouetting of the left costophrenic angle. Bilateral mild pleural/parenchymal scarring, unchanged. IMPRESSION: 1. Stable cardiomegaly. 2. Left lower lobe hazy opacity which may represent atelectasis or effusion. Underlying airspace disease can not be excluded. Electronically Signed   By: Keane Police D.O.   On: 11/14/2021 15:59   ECHOCARDIOGRAM COMPLETE  Result Date: 11/15/2021    ECHOCARDIOGRAM REPORT   Patient Name:   TEMPLE EWART [facility] Date of Exam: 11/15/2021 Medical Rec #:  [id number]        Height:       60.0 in Accession #:    [id number]       Weight:       111.3 lb Date of Birth:  1933-07-15         BSA:          1.455 m Patient Age:    89 years         BP:           161/80 mmHg Patient Gender: F                HR:           93 bpm. Exam Location:  [facility] Procedure: 2D Echo, Cardiac Doppler and Color Doppler Indications:     Chest pain R07.9  History:         Patient has prior history of Echocardiogram examinations, most                  recent 02/23/2021. CHF, Previous Myocardial Infarction, COPD;                  Risk Factors:Hypertension. SVT.  Sonographer:     Sherrie Sport Referring Phys:  [id number] [facility] Diagnosing Phys: Donnelly Angelica  Sonographer Comments: Technically challenging study due to limited acoustic windows. Image acquisition challenging due to breast implants and Image acquisition challenging due to COPD. IMPRESSIONS  1. Left ventricular ejection fraction, by estimation, is 55 to 60%. The left ventricle has normal function. The left ventricle has no regional wall motion abnormalities. Left ventricular diastolic parameters are consistent with Grade I diastolic dysfunction (impaired relaxation).  2. Right ventricular systolic function is normal. The  right ventricular size is not well visualized.  3. Left atrial size was moderately dilated.  4. The mitral valve is degenerative. Mild mitral valve regurgitation. No evidence of mitral stenosis. The mean mitral valve gradient is 5.0 mmHg.  5. The aortic valve is calcified. Aortic valve regurgitation is trivial. Aortic valve sclerosis/calcification is present, without any evidence of aortic stenosis. FINDINGS  Left Ventricle: Left ventricular ejection fraction, by estimation, is 55 to 60%. The left ventricle has normal function. The left ventricle has no regional wall motion abnormalities. The left ventricular internal cavity size was normal in size. There is  no left ventricular hypertrophy. Left ventricular diastolic parameters are consistent with Grade I diastolic dysfunction (impaired relaxation). Right Ventricle: The right ventricular size is not well visualized. Right vetricular wall thickness was not well visualized. Right ventricular systolic function is normal. Left Atrium: Left atrial size was moderately dilated. Right Atrium: Right atrial size was not well visualized. Pericardium: There is no evidence of pericardial effusion. Mitral Valve: The mitral valve is degenerative in appearance. Mild mitral valve regurgitation. No evidence of mitral valve stenosis. MV peak gradient, 9.6 mmHg. The mean mitral valve gradient is 5.0 mmHg. Tricuspid Valve: The tricuspid valve  is normal in structure. Tricuspid valve regurgitation is trivial. Aortic Valve: The aortic valve is calcified. Aortic valve regurgitation is trivial. Aortic valve sclerosis/calcification is present, without any evidence of aortic stenosis. Aortic valve mean gradient measures 4.0 mmHg. Aortic valve peak gradient measures 7.7 mmHg. Aortic valve area, by VTI measures 1.97 cm. Pulmonic Valve: The pulmonic valve was not well visualized. Pulmonic valve regurgitation is not visualized. Aorta: The aortic root is normal in size and structure. IAS/Shunts:  The interatrial septum was not well visualized.  LEFT VENTRICLE PLAX 2D LVIDd:         4.30 cm   Diastology LVIDs:         3.10 cm   LV e' medial:    4.03 cm/s LV PW:         1.10 cm   LV E/e' medial:  2.0 LV IVS:        1.10 cm   LV e' lateral:   7.34 cm/s LVOT diam:     2.00 cm   LV E/e' lateral: 1.1 LV SV:         51 LV SV Index:   35 LVOT Area:     3.14 cm  RIGHT VENTRICLE RV S prime:     11.20 cm/s TAPSE (M-mode): 2.0 cm LEFT ATRIUM             Index        RIGHT ATRIUM           Index LA diam:        3.60 cm 2.47 cm/m   RA Area:     14.90 cm LA Vol (A2C):   72.1 ml 49.55 ml/m  RA Volume:   30.50 ml  20.96 ml/m LA Vol (A4C):   49.3 ml 33.88 ml/m LA Biplane Vol: 59.7 ml 41.02 ml/m  AORTIC VALVE                    PULMONIC VALVE AV Area (Vmax):    2.21 cm     PV Vmax:        0.56 m/s AV Area (Vmean):   1.79 cm     PV Vmean:       38.300 cm/s AV Area (VTI):     1.97 cm     PV VTI:         0.114 m AV Vmax:           139.00 cm/s  PV Peak grad:   1.3 mmHg AV Vmean:          94.400 cm/s  PV Mean grad:   1.0 mmHg AV VTI:            0.257 m      RVOT Peak grad: 6 mmHg AV Peak Grad:      7.7 mmHg AV Mean Grad:      4.0 mmHg LVOT Vmax:         97.90 cm/s LVOT Vmean:        53.700 cm/s LVOT VTI:          0.161 m LVOT/AV VTI ratio: 0.63  AORTA Ao Root diam: 2.90 cm MITRAL VALVE                TRICUSPID VALVE MV Area (PHT): 4.31 cm     TR Peak grad:   10.8 mmHg MV Peak grad:  9.6 mmHg     TR Vmax:        164.00 cm/s MV  Mean grad:  5.0 mmHg MV Vmax:       1.55 m/s     SHUNTS MV Vmean:      103.0 cm/s   Systemic VTI:  0.16 m MV Decel Time: 176 msec     Systemic Diam: 2.00 cm MV E velocity: 8.05 cm/s    Pulmonic VTI:  0.252 m MV A velocity: 133.00 cm/s MV E/A ratio:  0.06 Donnelly Angelica Electronically signed by Donnelly Angelica Signature Date/Time: 11/15/2021/5:13:23 PM    Final     ECHO 02/2021  1. Left ventricular ejection fraction, by estimation, is 55 to 60%. The  left ventricle has normal function. The left ventricle has  no regional  wall motion abnormalities. Left ventricular diastolic parameters are  consistent with Grade III diastolic  dysfunction (restrictive).   2. Right ventricular systolic function is normal. The right ventricular  size is normal.   3. The mitral valve is myxomatous. Trivial mitral valve regurgitation.   4. The tricuspid valve is myxomatous. Tricuspid valve regurgitation is  mild to moderate.   5. The aortic valve is grossly normal. Aortic valve regurgitation is  mild. Mild to moderate aortic valve sclerosis/calcification is present,  without any evidence of aortic stenosis.   TELEMETRY reviewed by me: Normal sinus rhythm with rate 60s-70s, multiple periods of atrial tachycardia vs SVT on tele this morning, longest lasting 27 mins.   EKG reviewed by me: Initial EKG 3/7 at 1544 showed sinus rhythm rate 95 PVCs, LAFB, RBBB repeat EKG 3/8 showed sinus tach rate 137  ASSESSMENT AND PLAN:  The patient is an 88 year old female with a past medical history notable for HFpEF (LVEF 55-60% with myxomatous MV, mild AR with g3DD), history of MI 1999, COPD on 4 L at baseline, hypertension, rheumatoid arthritis who presented to [facility] ED with shortness of breath and productive cough for 2 to 3 days.  She presented hypoxic to the 70s and severely dyspneic requiring BiPAP initially.  Cardiology was consulted because of her elevated troponin and chest pain.  #Acute hypoxic respiratory failure 2/2 CAP/COPD (baseline 4 L) #Atypical chest pain The patient presents with a 3-day history of "feeling bad" with increased cough and central chest pain that is nonradiating, nonpleuritic, nonexertional and, intermittent, reproducible with palpation.  She was discovered to have pneumonia with elevated procalcitonin.  Her troponin was mildly elevated which is due to demand ischemia.  She does not appear grossly volume overloaded, but we have initiated diuresis on 11/18/2021 to help with her persistent shortness of  breath. -Agree with treatment of primary infection -Wean oxygen as tolerated, on 4 L at baseline. - Continue '81mg'$  ASA daily.  - continue metoprolol - Continue lisinopril at '10mg'$  today  -s/p heparin gtt, ended 3/8 - Lasix 40 mg IV yesterday. Will resume home HCTZ.  -Continuous monitoring on telemetry while inpatient -Defer invasive cardiac diagnostics.  # Paroxysmal SVT Frequent episodes of SVT since admission, appears to be consistent with atrial tachycardia (onset with PAC) that persist for as long as 30 minutes.  - Continue metoprolol 25 mg BID - Low HR- Decrease amiodarone 200 mg BID for 5 gram load (10 days), then 200 mg daily  #HFpEF (LVEF 55-60% w G1dd, mild MR, aortic sclerosis) Persistently SOB despite treatment of infection.  - Improved after lasix 3/11; will resume home HCTZ today  #Hypertension #CAD with history of MI 1999 -Increase lisinopril to 20 mg today. Home dose is 40 mg - Resume home HCTZ 12.5 mg.  -She will need  to establish care with cardiology after discharge.  #AKI, resolved Creatinine on admission 1.5 GFR 34, back to 0.82 and >60.    Signed: Andrez Grime, MD 11/19/2021, 8:39 AM [facility] Cardiology

## 2021-11-19 NOTE — Progress Notes (Signed)
PULMONOLOGY         Date: 11/19/2021,   MRN# [id number] [name] 24-Mar-1933     AdmissionWeight: 50.5 kg                 CurrentWeight: 50.5 kg   Referring physician: Dr Algis Liming   CHIEF COMPLAINT:   Acute on chronic hypoxemic respiratory failure   HISTORY OF PRESENT ILLNESS   This is an 88 yo F with hx of chronic lung disease with Asthma COPD overlap syndrome (ACOS) and chronic hypoxemia on 4L/min [place] as well as RA , GERD, HTN. She does have husband at bedside and he is able to help with details of history.   Patient reports that home RN was able to evaluate her and noted that she was in distress with labored breathing and asked for patient to be seen in ER.   On admission to ER she required BIPAP 10/5 40% FioO2.   She had chest imaging done via CXR with no grossly appreciable infiltrate with chronic opacification due to breat implants.  CT angio done Aug 2022 reviewed independently which does show chronic bronchitic changes with crowding of right basal bronchial segments.  No fibrotic changes to suggest RA-ILD.   Blood work with AKI and hypoalbuminemia with hypokalemia.    She received IV fluids and after this the CBC did show dilutional effects with anemia and other cell lines reduced in parallel which is expected. Patient denies having hemoptysis but did report loose stools unsure if there is blood in it.   She does have leukocytosis on arrival. She was placed on heparin due to notable troponin elevation and had cardiology evaluation while in ER.   11/16/21- patient is improved, I reviewed CT chest findings with her and husband at bedside.   11/17/21- patient with desaturation on mild exertion. She has consolidated LLL and would benefit from mcuolytic neb therapy as well a more aggressive BPH with metaneb to optimize for dc home. Revewed plan with Attending physician today. Met with daughter to review medical plan   11/18/21- patient is still feeling bad but  coughing up phlegm.   CXR is minimally improved.  Anemia is progressively worse over past year and she had anal cancer before , query regarding GI loss , she reports multiple stools daily. FOBT ordered twice but not done.  Steroids and lasix ongoing. She is bringing up copious phlegm on expectoration.             11/19/21-patient is stable without overnight events.  Plan to continue zithrmoax and rocephin x 10d and steroids with weaning reduced dose to 67m.  PAST MEDICAL HISTORY   Past Medical History:  Diagnosis Date   Anxiety    Asthma    CHF (congestive heart failure) ([facility]    COPD (chronic obstructive pulmonary disease) ([facility]    Hypertension    [place]anoma ([facility]    scalp (2007) & leg (2013)   [place]ocardial [place] 09[place]10/1997   no PCI or CABG   Osteoporosis    Rheumatoid arthritis ([facility]    SVT (supra[place]ricular tachycardia) ([facility]      SURGICAL HISTORY   Past Surgical History:  Procedure Laterality Date   ABDOMINAL HYSTERECTOMY     BREAST SURGERY     CARDIAC SURGERY     CARPAL TUNNEL RELEASE     MELANOMA EXCISION  2007   scalp   MELANOMA EXCISION Right 2013   lower leg   SHOULDER SURGERY  TOTAL HIP REVISION  2015   VIDEO BRONCHOSCOPY Bilateral 04/08/2014   Procedure: VIDEO BRONCHOSCOPY WITHOUT FLUORO;  Surgeon: Juanito Doom, MD;  Location: [facility];  Service: Cardiopulmonary;  Laterality: Bilateral;     FAMILY HISTORY   History reviewed. No pertinent family history.   SOCIAL HISTORY   Social History   Tobacco Use   Smoking status: Former    Packs/day: 1.50    Years: 22.00    Pack years: 33.00    Types: Cigarettes    Quit date: 04/07/1974    Years since quitting: 47.6   Smokeless tobacco: Never  Substance Use Topics   Alcohol use: No   Drug use: No     MEDICATIONS    Home Medication:     Current Medication:  Current Facility-Administered Medications:    acetaminophen (TYLENOL) tablet 650 mg, 650 mg, Oral, Q6H PRN,  Pudota, Kathe Becton, MD, 650 mg at 11/19/21 0112   albuterol (PROVENTIL) (2.5 MG/3ML) 0.083% nebulizer solution 2.5 mg, 2.5 mg, Nebulization, Q2H PRN, Lanney Gins, Pia Jedlicka, MD   amiodarone (PACERONE) tablet 200 mg, 200 mg, Oral, BID, Andrez Grime, MD, 200 mg at 11/19/21 0941   aspirin EC tablet 81 mg, 81 mg, Oral, Daily, Tang, [employer], PA-C, 81 mg at 11/19/21 0942   azithromycin (ZITHROMAX) tablet 500 mg, 500 mg, Oral, QPM, Hongalgi, Anand D, MD, 500 mg at 11/18/21 1651   cefTRIAXone (ROCEPHIN) 1 g in sodium chloride 0.9 % 100 mL IVPB, 1 g, Intravenous, Q24H, Benita Gutter, RPH, Stopped at 11/18/21 1900   citalopram (CELEXA) tablet 20 mg, 20 mg, Oral, Daily, Lockie Mola B, RPH, 20 mg at 11/19/21 0941   fluticasone furoate-vilanterol (BREO ELLIPTA) 100-25 MCG/ACT 1 puff, 1 puff, Inhalation, Daily, 1 puff at 11/19/21 0940 **AND** umeclidinium bromide (INCRUSE ELLIPTA) 62.5 MCG/ACT 1 puff, 1 puff, Inhalation, Daily, Pudota, Kathe Becton, MD, 1 puff at 11/19/21 0940   heparin injection 5,000 Units, 5,000 Units, Subcutaneous, Q8H, Kristopher Oppenheim, DO, 5,000 Units at 11/19/21 0504   hydrochlorothiazide (HYDRODIURIL) tablet 12.5 mg, 12.5 mg, Oral, Daily, Andrez Grime, MD   insulin aspart (novoLOG) injection 0-6 Units, 0-6 Units, Subcutaneous, TID WC, Hongalgi, Lenis Dickinson, MD, 1 Units at 11/18/21 1257   lisinopril (ZESTRIL) tablet 20 mg, 20 mg, Oral, Daily, Andrez Grime, MD, 20 mg at 11/19/21 0941   melatonin tablet 5 mg, 5 mg, Oral, QHS PRN, Sharion Settler, NP, 5 mg at 11/18/21 2107   methylPREDNISolone sodium succinate (SOLU-MEDROL) 125 mg/2 mL injection 60 mg, 60 mg, Intravenous, Q24H, Linnae Rasool, MD, 60 mg at 11/19/21 0503   metoprolol tartrate (LOPRESSOR) tablet 25 mg, 25 mg, Oral, BID, Hongalgi, Anand D, MD, 25 mg at 11/19/21 0942   montelukast (SINGULAIR) tablet 10 mg, 10 mg, Oral, QHS, Pudota, Kingsley P, MD, 10 mg at 11/18/21 2107   nitroGLYCERIN (NITROSTAT) SL tablet 0.4 mg,  0.4 mg, Sublingual, Q5 min PRN, Pudota, Kathe Becton, MD   pantoprazole (PROTONIX) EC tablet 40 mg, 40 mg, Oral, Daily, Benita Gutter, RPH, 40 mg at 11/19/21 0941   sucralfate (CARAFATE) tablet 1 g, 1 g, Oral, QID, Pudota, Kathe Becton, MD, 1 g at 11/19/21 3646    ALLERGIES   Celebrex [celecoxib] and Morphine and related     REVIEW OF SYSTEMS    Review of Systems:  Gen:  Denies  fever, sweats, chills weigh loss  HEENT: Denies blurred vision, double vision, ear pain, eye pain, hearing loss, nose bleeds, sore throat Cardiac:  No dizziness, chest pain or heaviness, chest tightness,edema Resp:   Denies cough or sputum porduction, shortness of breath,wheezing, hemoptysis,  Gi: Denies swallowing difficulty, stomach pain, nausea or vomiting, diarrhea, constipation, bowel incontinence Gu:  Denies bladder incontinence, burning urine Ext:   Denies Joint pain, stiffness or swelling Skin: Denies  skin rash, easy bruising or bleeding or hives Endoc:  Denies polyuria, polydipsia , polyphagia or weight change Psych:   Denies depression, insomnia or hallucinations   Other:  All other systems negative   VS: BP (!) 175/77 (BP Location: Left Arm)    Pulse (!) 56    Temp 98.4 F (36.9 C)    Resp 16    Ht 5' (1.524 m)    Wt 50.5 kg    SpO2 93%    BMI 21.74 kg/m      PHYSICAL EXAM    GENERAL:NAD, no fevers, chills, no weakness no fatigue HEAD: Normocephalic, atraumatic.  EYES: Pupils equal, round, reactive to light. Extraocular muscles intact. No scleral icterus.  MOUTH: Moist mucosal membrane. Dentition intact. No abscess noted.  EAR, NOSE, THROAT: Clear without exudates. No external lesions.  NECK: Supple. No thyromegaly. No nodules. No JVD.  PULMONARY:reduced air entry bilaterally  CARDIOVASCULAR: S1 and S2. Regular rate and rhythm. No murmurs, rubs, or gallops. No edema. Pedal pulses 2+ bilaterally.  GASTROINTESTINAL: Soft, nontender, nondistended. No masses. Positive bowel sounds. No  hepatosplenomegaly.  MUSCULOSKELETAL: No swelling, clubbing, or edema. Range of motion full in all extremities.  NEUROLOGIC: Cranial nerves II through XII are intact. No gross focal neurological deficits. Sensation intact. Reflexes intact.  SKIN: No ulceration, lesions, rashes, or cyanosis. Skin warm and dry. Turgor intact.  PSYCHIATRIC: Mood, affect within normal limits. The patient is awake, alert and oriented x 3. Insight, judgment intact.       IMAGING    CT Angio Chest Pulmonary Embolism (PE) W or WO Contrast  Result Date: 11/15/2021 CLINICAL DATA:  Chronic respiratory failure EXAM: CT ANGIOGRAPHY CHEST WITH CONTRAST TECHNIQUE: Multidetector CT imaging of the chest was performed using the standard protocol during bolus administration of intravenous contrast. Multiplanar CT image reconstructions and MIPs were obtained to evaluate the vascular anatomy. RADIATION DOSE REDUCTION: This exam was performed according to the departmental dose-optimization program which includes automated exposure control, adjustment of the mA and/or kV according to patient size and/or use of iterative reconstruction technique. CONTRAST:  52m OMNIPAQUE IOHEXOL 300 MG/ML  SOLN COMPARISON:  04/11/2021 FINDINGS: Cardiovascular: Atherosclerotic calcifications of the thoracic aorta are noted. No aneurysmal dilatation or dissection is noted. Mild cardiac enlargement is seen. No pericardial effusion is seen. Coronary calcifications are noted. Pulmonary artery shows a normal branching pattern bilaterally. No intraluminal filling defect is identified. Mediastinum/Nodes: Thoracic inlet is within normal limits. No sizable hilar or mediastinal adenopathy is noted. The esophagus as visualized is within normal limits. Lungs/Pleura: Diffuse emphysematous changes are noted in the lungs bilaterally stable in appearance from the prior exam. Small effusion is noted on the left with lower lobe infiltrate new from the prior study. No sizable  parenchymal nodules are seen. Upper Abdomen: Visualized upper abdomen is within normal limits. Musculoskeletal: Degenerative changes of the thoracic spine are noted. No definitive rib abnormality is seen. Bilateral breast implants are seen with evidence of stable intracapsular rupture similar to that noted on the prior exam. Review of the MIP images confirms the above findings. IMPRESSION: No evidence of pulmonary emboli. New left lower lobe infiltrate with associated small effusion. Aortic Atherosclerosis (ICD10-I70.0) and  Emphysema (ICD10-J43.9). Electronically Signed   By: Inez Catalina M.D.   On: 11/15/2021 21:21   DG Chest Port 1 View  Result Date: 11/18/2021 CLINICAL DATA:  Follow-up pneumonia. EXAM: PORTABLE CHEST 1 VIEW COMPARISON:  11/14/2021 and prior radiographs FINDINGS: UPPER limits normal heart size again noted. LEFT retrocardiac LOWER lung opacity is relatively unchanged. No pneumothorax or large pleural effusion identified. No significant changes are identified. IMPRESSION: Unchanged LEFT LOWER lung opacity which may represent atelectasis/effusion/pneumonia. Electronically Signed   By: Margarette Canada M.D.   On: 11/18/2021 10:28   DG Chest Portable 1 View  Result Date: 11/14/2021 CLINICAL DATA:  Respiratory distress. EXAM: PORTABLE CHEST 1 VIEW COMPARISON:  Chest radiograph dated April 13, 2019 FINDINGS: The heart is enlarged. Atherosclerotic calcification of the aortic arch. Left lower lobe hazy opacity with silhouetting of the left costophrenic angle. Bilateral mild pleural/parenchymal scarring, unchanged. IMPRESSION: 1. Stable cardiomegaly. 2. Left lower lobe hazy opacity which may represent atelectasis or effusion. Underlying airspace disease can not be excluded. Electronically Signed   By: Keane Police D.O.   On: 11/14/2021 15:59   ECHOCARDIOGRAM COMPLETE  Result Date: 11/15/2021    ECHOCARDIOGRAM REPORT   Patient Name:   [name] Date of Exam: 11/15/2021 Medical Rec #:  [id number]         Height:       60.0 in Accession #:    [id number]       Weight:       111.3 lb Date of Birth:  08-26-1933         BSA:          1.455 m Patient Age:    73 years         BP:           161/80 mmHg Patient Gender: F                HR:           93 bpm. Exam Location:  [facility] Procedure: 2D Echo, Cardiac Doppler and Color Doppler Indications:     Chest pain R07.9  History:         Patient has prior history of Echocardiogram examinations, most                  recent 02/23/2021. CHF, Previous Myocardial Infarction, COPD;                  Risk Factors:Hypertension. SVT.  Sonographer:     Sherrie Sport Referring Phys:  [id number] [facility] P PUD[place] Diagnosing Phys: Donnelly Angelica  Sonographer Comments: Technically challenging study due to limited acoustic windows. Image acquisition challenging due to breast implants and Image acquisition challenging due to COPD. IMPRESSIONS  1. Left ventricular ejection fraction, by estimation, is 55 to 60%. The left ventricle has normal function. The left ventricle has no regional wall motion abnormalities. Left ventricular diastolic parameters are consistent with Grade I diastolic dysfunction (impaired relaxation).  2. Right ventricular systolic function is normal. The right ventricular size is not well visualized.  3. Left atrial size was moderately dilated.  4. The mitral valve is degenerative. Mild mitral valve regurgitation. No evidence of mitral stenosis. The mean mitral valve gradient is 5.0 mmHg.  5. The aortic valve is calcified. Aortic valve regurgitation is trivial. Aortic valve sclerosis/calcification is present, without any evidence of aortic stenosis. FINDINGS  Left Ventricle: Left ventricular ejection fraction, by estimation, is 55 to 60%. The left ventricle has normal function.  The left ventricle has no regional wall motion abnormalities. The left ventricular internal cavity size was normal in size. There is  no left ventricular hypertrophy. Left ventricular diastolic parameters are  consistent with Grade I diastolic dysfunction (impaired relaxation). Right Ventricle: The right ventricular size is not well visualized. Right vetricular wall thickness was not well visualized. Right ventricular systolic function is normal. Left Atrium: Left atrial size was moderately dilated. Right Atrium: Right atrial size was not well visualized. Pericardium: There is no evidence of pericardial effusion. Mitral Valve: The mitral valve is degenerative in appearance. Mild mitral valve regurgitation. No evidence of mitral valve stenosis. MV peak gradient, 9.6 mmHg. The mean mitral valve gradient is 5.0 mmHg. Tricuspid Valve: The tricuspid valve is normal in structure. Tricuspid valve regurgitation is trivial. Aortic Valve: The aortic valve is calcified. Aortic valve regurgitation is trivial. Aortic valve sclerosis/calcification is present, without any evidence of aortic stenosis. Aortic valve mean gradient measures 4.0 mmHg. Aortic valve peak gradient measures 7.7 mmHg. Aortic valve area, by VTI measures 1.97 cm. Pulmonic Valve: The pulmonic valve was not well visualized. Pulmonic valve regurgitation is not visualized. Aorta: The aortic root is normal in size and structure. IAS/Shunts: The interatrial septum was not well visualized.  LEFT VENTRICLE PLAX 2D LVIDd:         4.30 cm   Diastology LVIDs:         3.10 cm   LV e' medial:    4.03 cm/s LV PW:         1.10 cm   LV E/e' medial:  2.0 LV IVS:        1.10 cm   LV e' lateral:   7.34 cm/s LVOT diam:     2.00 cm   LV E/e' lateral: 1.1 LV SV:         51 LV SV Index:   35 LVOT Area:     3.14 cm  RIGHT VENTRICLE RV S prime:     11.20 cm/s TAPSE (M-mode): 2.0 cm LEFT ATRIUM             Index        RIGHT ATRIUM           Index LA diam:        3.60 cm 2.47 cm/m   RA Area:     14.90 cm LA Vol (A2C):   72.1 ml 49.55 ml/m  RA Volume:   30.50 ml  20.96 ml/m LA Vol (A4C):   49.3 ml 33.88 ml/m LA Biplane Vol: 59.7 ml 41.02 ml/m  AORTIC VALVE                    PULMONIC  VALVE AV Area (Vmax):    2.21 cm     PV Vmax:        0.56 m/s AV Area (Vmean):   1.79 cm     PV Vmean:       38.300 cm/s AV Area (VTI):     1.97 cm     PV VTI:         0.114 m AV Vmax:           139.00 cm/s  PV Peak grad:   1.3 mmHg AV Vmean:          94.400 cm/s  PV Mean grad:   1.0 mmHg AV VTI:            0.257 m      RVOT Peak grad:  6 mmHg AV Peak Grad:      7.7 mmHg AV Mean Grad:      4.0 mmHg LVOT Vmax:         97.90 cm/s LVOT Vmean:        53.700 cm/s LVOT VTI:          0.161 m LVOT/AV VTI ratio: 0.63  AORTA Ao Root diam: 2.90 cm MITRAL VALVE                TRICUSPID VALVE MV Area (PHT): 4.31 cm     TR Peak grad:   10.8 mmHg MV Peak grad:  9.6 mmHg     TR Vmax:        164.00 cm/s MV Mean grad:  5.0 mmHg MV Vmax:       1.55 m/s     SHUNTS MV Vmean:      103.0 cm/s   Systemic VTI:  0.16 m MV Decel Time: 176 msec     Systemic Diam: 2.00 cm MV E velocity: 8.05 cm/s    Pulmonic VTI:  0.252 m MV A velocity: 133.00 cm/s MV E/A ratio:  0.06 Donnelly Angelica Electronically signed by Donnelly Angelica Signature Date/Time: 11/15/2021/5:13:23 PM    Final          ASSESSMENT/PLAN   Acute on chronic hypoxemic respiratory failure -etiology differential includes acute COPD exacerbation vs viral LRTI vs atypical pneumonia , vs acute blood loss anemia vs acute on chronic diastolic CHF - present on admission  - COVID19 negative   - supplemental O2 during my evaluation 5L/min  - nfectious workup for pneumonia as below -Respiratory viral panel-negative -serum fungitell -legionella ab -strep pneumoniae ur AG -Histoplasma Ur Ag -sputum resp cultures -reviewed pertinent imaging with patient today - ESR/CRP -severely elevated  -PT/OT for d/c planning  -please encourage patient to use incentive spirometer few times each hour while hospitalized.   Metaneb and mucomyst for BPH   Anemia    Trending down H/h     - fecal occult blood stool - patient reports loose stool      -currently on heparin - consider DC if not  needed as cardiology thinks its unlikely ACS   Acute on chronic diastolic CHF    In context of AE COPD vs LRTI   -BNP-significantly elevated     - cardiology consult - appreciate input - Dr Corky Sox and Orinda Kenner     - s/p TTE - reviewed no severe findings   Acute exacerbation of COPD     -Wean down IV solumedrol reduced to 40 bid from 120,    -Continue rocephin zithromax for now     -PRN duoneb   - Incruse ellipta   - Breo once daily    - IS and Flutter several times daily    Severe GERD   -Carafate     Thank you for allowing me to participate in the care of this patient.  Patient has at least 1 severe active medical problem which is life threatening and is being managed during this evaluation  Patient/Family are satisfied with care plan and all questions have been answered.  This document was prepared using Dragon voice recognition software and may include unintentional dictation errors.     Ottie Glazier, M.D.  Division of [place]

## 2021-11-19 NOTE — Progress Notes (Signed)
Physical Therapy Treatment ?Patient Details ?Name: Amy CONG ?MRN: [id number] ?DOB: September 20, 1932 ?Today's Date: 11/19/2021 ? ? ?History of Present Illness 88 year old female with medical history significant for CAD, HTN, COPD, chronic respiratory failure with hypoxia on 4 L/min nasal cannula oxygen, rheumatoid arthritis, osteoporosis and GERD presented to the ED with complaints of dyspnea.  She reportedly saw her MD for left hip pain and was diagnosed with a "crack" in the hip bone and prescribed some meds.  Following taking the meds prescribed, she started experiencing worsening dyspnea.  This was associated with productive cough without fever or chest pain.  Home DuoNebs did not offer relief.  Admitted for acute on chronic respiratory failure with hypoxia due to suspected COPD exacerbation, community-acquired pneumonia. ? ?  ?PT Comments  ? ? Patient is cooperative and eager to see physical therapy today. Overall she is feeling better and reports that she wants to get OOB and return home. The patient was able to stand and ambulate a few feet using rolling walker with minimal assistance. Standing tolerance increased to ~ 5 minutes. She is on 5 L02 at rest.  Sp02 decreased to 84% on 4 L02, but remained at 90-93% on 6 L02 with standing activity (there was not an option for 5 L02 on the portable 02 tank). After standing activity, Sp02 remained at or above 90% on 5 L02. She reports requiring only 4 L02 at home.  ?She is making progress with activity tolerance and functional independence. She could benefit from short term rehab at SNF, however she wants to go home. Recommend to continue PT to maximize independence and decrease caregiver burden. She will likely still require physical assistance with mobility if going home.  ?  ?Recommendations for follow up therapy are one component of a multi-disciplinary discharge planning process, led by the attending physician.  Recommendations may be updated based on patient  status, additional functional criteria and insurance authorization. ? ?Follow Up Recommendations ? Skilled nursing-short term rehab (<3 hours/day) ?  ?  ?Assistance Recommended at Discharge Intermittent Supervision/Assistance  ?Patient can return home with the following A little help with walking and/or transfers;A little help with bathing/dressing/bathroom;Assistance with cooking/housework;Assist for transportation;Help with stairs or ramp for entrance ?  ?Equipment Recommendations ? None recommended by PT  ?  ?Recommendations for Other Services   ? ? ?  ?Precautions / Restrictions Precautions ?Precautions: Fall ?Restrictions ?Weight Bearing Restrictions: No  ?  ? ?Mobility ? Bed Mobility ?Overal bed mobility: Needs Assistance ?Bed Mobility: Supine to Sit, Sit to Supine ?  ?  ?Supine to sit: Min assist ?Sit to supine: Min guard ?  ?General bed mobility comments: increased time and effort required. cues for technique. Sp02 remains in the low 90's with seated level activity on 5 L02 ?  ? ?Transfers ?Overall transfer level: Needs assistance ?Equipment used: Rolling walker (2 wheels) ?Transfers: Sit to/from Stand ?Sit to Stand: Min assist ?  ?  ?  ?  ?  ?General transfer comment: lifting assistance required for standing. ?  ? ?Ambulation/Gait ?Ambulation/Gait assistance: Min assist ?Gait Distance (Feet): 2 Feet ?Assistive device: Rolling walker (2 wheels) ?Gait Pattern/deviations: Step-to pattern ?Gait velocity: decreased ?  ?  ?General Gait Details: portable 02 tank initially left at 4 L02 (there is no option for 5 L). Sp02 decreased to 84% on the 4 L02 but increased and maintained at 90% or above on 6 L02 with standing activity and taking a few steps at bedside. cues for technique and safety with  need to use rolling walker for support and fall prevention. overall standing tolerance was ~ 5 minutes today without significant shortness of breath ? ? ?Stairs ?  ?  ?  ?  ?  ? ? ?Wheelchair Mobility ?  ? ?Modified Rankin  (Stroke Patients Only) ?  ? ? ?  ?Balance Overall balance assessment: Needs assistance ?Sitting-balance support: Single extremity supported ?Sitting balance-Leahy Scale: Good ?  ?  ?Standing balance support: Bilateral upper extremity supported, Reliant on assistive device for balance ?Standing balance-Leahy Scale: Fair ?Standing balance comment: using rolling walker for support in standing ?  ?  ?  ?  ?  ?  ?  ?  ?  ?  ?  ?  ? ?  ?Cognition Arousal/Alertness: Awake/alert ?Behavior During Therapy: [facility] for tasks assessed/performed ?Overall Cognitive Status: Within Functional Limits for tasks assessed ?  ?  ?  ?  ?  ?  ?  ?  ?  ?  ?  ?  ?  ?  ?  ?  ?General Comments: patient able to follow all commands without difficulty ?  ?  ? ?  ?Exercises   ? ?  ?General Comments   ?  ?  ? ?Pertinent Vitals/Pain Pain Assessment ?Pain Assessment: No/denies pain  ? ? ?Home Living   ?  ?  ?  ?  ?  ?  ?  ?  ?  ?   ?  ?Prior Function    ?  ?  ?   ? ?PT Goals (current goals can now be found in the care plan section) Acute Rehab PT Goals ?Patient Stated Goal: to return home ?PT Goal Formulation: With patient ?Time For Goal Achievement: 11/30/21 ?Potential to Achieve Goals: Fair ?Progress towards PT goals: Progressing toward goals ? ?  ?Frequency ? ? ? Min 2X/week ? ? ? ?  ?PT Plan Current plan remains appropriate  ? ? ?Co-evaluation   ?  ?  ?  ?  ? ?  ?AM-PAC PT "6 Clicks" Mobility   ?Outcome Measure ? Help needed turning from your back to your side while in a flat bed without using bedrails?: A Little ?Help needed moving from lying on your back to sitting on the side of a flat bed without using bedrails?: A Little ?Help needed moving to and from a bed to a chair (including a wheelchair)?: A Little ?Help needed standing up from a chair using your arms (e.g., wheelchair or bedside chair)?: A Little ?Help needed to walk in hospital room?: A Lot ?Help needed climbing 3-5 steps with a railing? : Total ?6 Click Score: 15 ? ?  ?End of Session  Equipment Utilized During Treatment: Oxygen ?Activity Tolerance: Patient tolerated treatment well ?Patient left: in bed;with call bell/phone within reach;with bed alarm set;with family/visitor present ?Nurse Communication: Mobility status ?PT Visit Diagnosis: Muscle weakness (generalized) (M62.81);Difficulty in walking, not elsewhere classified (R26.2) ?  ? ? ?Time: 8841-6606 ?PT Time Calculation (min) (ACUTE ONLY): 30 min ? ?Charges:  $Therapeutic Activity: 23-37 mins          ?          ? ?Minna Merritts, PT, MPT ? ? ? ?Percell Locus ?11/19/2021, 2:19 PM ? ?

## 2021-11-20 ENCOUNTER — Encounter: Payer: Self-pay | Admitting: Internal Medicine

## 2021-11-20 DIAGNOSIS — Z7189 Other specified counseling: Secondary | ICD-10-CM

## 2021-11-20 DIAGNOSIS — Z515 Encounter for palliative care: Secondary | ICD-10-CM

## 2021-11-20 LAB — GLUCOSE, CAPILLARY
Glucose-Capillary: 88 mg/dL (ref 70–99)
Glucose-Capillary: 126 mg/dL — ABNORMAL HIGH (ref 70–99)
Glucose-Capillary: 154 mg/dL — ABNORMAL HIGH (ref 70–99)
Glucose-Capillary: 79 mg/dL (ref 70–99)

## 2021-11-20 MED ORDER — RISAQUAD PO CAPS
2.0000 | ORAL_CAPSULE | Freq: Three times a day (TID) | ORAL | Status: DC
  Administered 2021-11-20 – 2021-11-22 (×6): 2 via ORAL
  Filled 2021-11-20 (×6): qty 2

## 2021-11-20 MED ORDER — PREDNISONE 50 MG PO TABS
50.0000 mg | ORAL_TABLET | Freq: Every day | ORAL | Status: DC
  Administered 2021-11-21 – 2021-11-22 (×2): 50 mg via ORAL
  Filled 2021-11-20 (×2): qty 1

## 2021-11-20 MED ORDER — LISINOPRIL 20 MG PO TABS
40.0000 mg | ORAL_TABLET | Freq: Every day | ORAL | Status: DC
  Administered 2021-11-20 – 2021-11-22 (×3): 40 mg via ORAL
  Filled 2021-11-20 (×3): qty 2

## 2021-11-20 NOTE — Progress Notes (Signed)
PROGRESS NOTE   [name]  XQJ:[id number]    DOB: Mar 11, 1933    DOA: 11/14/2021  PCP: [name], MD   I have briefly reviewed patients previous medical records in [facility].  Chief Complaint  Patient presents with   Shortness of Breath    Hospital Course:  88 year old female with medical history significant for CAD, HTN, COPD, chronic respiratory failure with hypoxia on 4 L/min nasal cannula oxygen, rheumatoid arthritis, osteoporosis and GERD presented to the ED with complaints of dyspnea.  She reportedly saw her MD for left hip pain and was diagnosed with a crack in the hip bone and prescribed some meds.  Following taking the meds prescribed, she started experiencing worsening dyspnea.  This was associated with productive cough without fever or chest pain.  Home DuoNebs did not offer relief.  She was directed to the ED.  Admitted for acute on chronic respiratory failure with hypoxia due to suspected COPD exacerbation, community-acquired pneumonia.  Needed BiPAP overnight of admission. Patient and spouse declined SNF recommended by therapies and insisted on going home.  Had significant dyspnea and hypoxia on minimal exertion, pulmonology following.  Persistent SVT/AT on 3/11, cardiology following.  Overall improving very slowly.     Assessment & Plan:  Principal Problem:   Acute and chronic respiratory failure with hypoxia (HCC) Active Problems:   COPD with acute exacerbation (HCC)   CAP (community acquired pneumonia)   PSVT (paroxysmal supraventricular tachycardia) (HCC)   Chronic diastolic CHF (congestive heart failure) (HCC)   Elevated troponin   AKI (acute kidney injury) ([place])   Essential hypertension   Left hip pain   Failure to thrive in adult   Rheumatoid arthritis (HCC)   Normocytic anemia   Hypokalemia   Assessment and Plan: * Acute and chronic respiratory failure with hypoxia (HCC) Suspected due to COPD exacerbation, possibly advanced and  pneumonia. Needed BiPAP overnight of admission Pulmonology following RVP panel negative.  ESR 73 and CRP 27.7 significantly elevated. Rheumatoid factor 91.8?  Significance in the context of known RA.  Serum Fungitell is pending BNP 826 but down from 1878, 7 months prior.  Clinically does not look volume overloaded but given ongoing significant DOE and hypoxia, given a dose of IV Lasix 40 mg x 1 with improvement. Procalcitonin 27.29 > 13.73, already on antibiotics. Supportive care with incentive spirometry. Reassess home oxygen requirement prior to discharge. Due to elevated D-dimer and concern for PE, obtained CTA chest, negative for PE. As per pulmonology, on nebulized Mucomyst 3 times daily followed by MetaNeb with saline to address left base consolidation.  Repeat chest x-ray without significant change Slowly improving in the last 48 hours, reports DOE better, remains on 5 L/min oxygen via [place] and saturating in the upper 90s.  Bringing out more phlegm.  COPD with acute exacerbation ([place]) Completed 5-day course of IV antibiotics (azithromycin, cefepime >ceftriaxone) for pneumonia. IV Solu-Medrol tapered to 40 mg every 24 hours.  Will change to oral prednisone in a.m. PCCM following.  CAP (community acquired pneumonia) Continue empirically started IV ceftriaxone and azithromycin Supportive care Follow extensive evaluation recommended by pulmonology. CTA chest 3/8 confirmed new left lower lobe infiltrate with associated small effusion. Completed 5-day course of IV antibiotics.  PSVT (paroxysmal supraventricular tachycardia) ([place]) Atrial tachycardia Cardiology following and assisting with management Despite metoprolol, had sustained SVT on 3/11. Therefore amiodarone initiated.  Cardiology recommends amiodarone 200 Mg twice daily for 10 days and then 200 Mg once daily thereafter.  Continue  metoprolol 25 Mg twice daily.   Likely all precipitated by respiratory issues.  Monitor on  telemetry Improved, less frequent and much shorter duration episodes.  Chronic diastolic CHF (congestive heart failure) (HCC) Her persistent dyspnea despite treatment for pneumonia and COPD. S/p IV Lasix 40 mg x 1 with improvement. Now resumed home HCTZ 12.5 Mg daily.  Elevated troponin Chest pain CAD Demand ischemia Chronic diastolic CHF PSVT Cardiology input appreciated and suspect demand ischemia in the context of underlying acute respiratory failure and infectious etiology. Clinically euvolemic but given degree of DOE and hypoxia, trial of IV Lasix 40 mg x 1. Musculoskeletal and reproducible chest pain has resolved 2D echo: LVEF 55-60%, no regional wall motion abnormalities and grade 1 diastolic dysfunction.  AKI (acute kidney injury) ([place]) Presented with creatinine of 1.5.  Resolved.  Essential hypertension Now on metoprolol 25 Mg twice daily Lisinopril increased to prior home dose of 40 Mg daily. Resumed HCTZ 12.5 Mg daily.  Left hip pain Patient evaluated as outpatient and told to have some chronic, exact details not available. Has not complained of pain here. Outpatient follow-up.  Failure to thrive in adult Multifactorial due to very advanced age, frail physical health and multiple severe significant comorbidities. Extensively discussed with patient, spouse and daughter at bedside, they are agreeable to a palliative care consult to address goals of care discussion. Palliative medicine input appreciated.  Full code.  Full scope treatment.  Hypokalemia Replaced.  Magnesium 2.2.  Normocytic anemia Hemoglobin has dropped from 10 to 8 g in the absence of overt bleeding. Hemoglobin now up to 9.3.  Rheumatoid arthritis ([place]) Reportedly in remission. Rheumatoid factor elevated, unclear significance.  Also has elevated ESR CRP.   Body mass index is 21.87 kg/m.  Nutritional Status        Pressure Ulcer:     DVT prophylaxis: heparin injection 5,000 Units  Start: 11/16/21 0600 SCDs Start: 11/15/21 2130 SCDs Start: 11/14/21 2031     Code Status: Full Code:  Family Communication: Spouse at bedside. Disposition:  Status is: Inpatient Remains inpatient appropriate because: Severity of illness, IV meds    Consultants:   Pulmonology Cardiology  Procedures:     Antimicrobials:   As above   Subjective:  Overall feels better.  States that she was up to bedside commode x2 and DOE is not as bad.  Cough more productive of "yucky phlegm".  No chest pain.  Appetite is better.  Objective:   Vitals:   11/19/21 2350 11/20/21 0452 11/20/21 0749 11/20/21 1107  BP: (!) 166/67 (!) 171/65 (!) 175/67 (!) 155/63  Pulse: (!) 53 (!) 55 (!) 57 (!) 55  Resp: _0 Temp: 98.3 F (36.8 C) 98.2 F (36.8 C) 97.9 F (36.6 C) 97.9 F (36.6 C)  TempSrc: Oral Oral    SpO2: 99% 98% 97% 94%  Weight:  50.8 kg    Height:        General exam: Elderly female, small built and frail, chronically ill looking, lying supine in bed.  Appears comfortable. Respiratory system: Few left basal crackles.  Otherwise clear to auscultation.  Distant breath sounds likely her baseline due to COPD.  No increased work of breathing.  No change/stable. Cardiovascular system: S1 & S2 heard, RRR. No JVD, murmurs, rubs, gallops or clicks. No pedal edema.   Gastrointestinal system: Abdomen is nondistended, soft and nontender. No organomegaly or masses felt. Normal bowel sounds heard. Central nervous system: Alert and oriented. No focal neurological deficits. Extremities:  Symmetric 5 x 5 power. Skin: No rashes, lesions or ulcers Psychiatry: Judgement and insight appear impaired. Mood & affect appropriate.     Data Reviewed:   I have personally reviewed following labs and imaging studies   CBC: Recent Labs  Lab 11/16/21 0440 11/17/21 1008 11/19/21 0420  WBC 11.2* 9.8 12.5*  HGB 8.2* 9.3* 8.4*  HCT 26.6* 29.9* 26.9*  MCV 84.7 83.5 82.3  PLT 314 365 357    Basic  Metabolic Panel: Recent Labs  Lab 11/15/21 0418 11/16/21 0440 11/17/21 1008 11/18/21 0431 11/19/21 0420  NA 135 140 137 139 135  K 3.2* 3.7 3.1* 4.2 3.6  CL 100 107 104 105 100  CO2 _0 GLUCOSE 174* 105* 118* 108* 101*  BUN 45* 34* 35* 34* 32*  CREATININE 1.02* 0.83 0.82 0.82 0.92  CALCIUM 8.2* 8.5* 8.6* 8.8* 8.5*  MG 2.0  --   --  2.2  --     Liver Function Tests: Recent Labs  Lab 11/14/21 1545 11/15/21 0418  AST 35 23  ALT 17 13  ALKPHOS 66 54  BILITOT 0.6 0.3  PROT 7.4 5.9*  ALBUMIN 3.6 2.8*    CBG: Recent Labs  Lab 11/19/21 2102 11/20/21 0749 11/20/21 1157  GLUCAP 105* 88 126*    Microbiology Studies:   Recent Results (from the past 240 hour(s))  Blood culture (routine x 2)     Status: None   Collection Time: 11/14/21  3:35 PM   Specimen: BLOOD  Result Value Ref Range Status   Specimen Description BLOOD BLRA  Final   Special Requests BOTTLES DRAWN AEROBIC AND ANAEROBIC BCAV  Final   Culture   Final    NO GROWTH 5 DAYS Performed at [facility], [address] [place], [place] [address]    Report Status 11/19/2021 FINAL  Final  Blood culture (routine x 2)     Status: None   Collection Time: 11/14/21  3:40 PM   Specimen: BLOOD  Result Value Ref Range Status   Specimen Description BLOOD RAC  Final   Special Requests BOTTLES DRAWN AEROBIC AND ANAEROBIC BCAV  Final   Culture   Final    NO GROWTH 5 DAYS Performed at [facility], [address] [place], [place] [address]    Report Status 11/19/2021 FINAL  Final  Resp Panel by RT-PCR (Flu A&B, Covid) Nasopharyngeal Swab     Status: None   Collection Time: 11/14/21  3:45 PM   Specimen: Nasopharyngeal Swab; Nasopharyngeal(NP) swabs in vial transport medium  Result Value Ref Range Status   SARS Coronavirus 2 by RT PCR NEGATIVE NEGATIVE Final    Comment: (NOTE) SARS-CoV-2 target nucleic acids are NOT DETECTED.  The SARS-CoV-2 RNA is generally detectable in upper  respiratory specimens during the acute phase of infection. The lowest concentration of SARS-CoV-2 viral copies this assay can detect is 138 copies/mL. A negative result does not preclude SARS-Cov-2 infection and should not be used as the sole basis for treatment or other patient management decisions. A negative result may occur with  improper specimen collection/handling, submission of specimen other than nasopharyngeal swab, presence of viral mutation(s) within the areas targeted by this assay, and inadequate number of viral copies(<138 copies/mL). A negative result must be combined with clinical observations, patient history, and epidemiological information. The expected result is Negative.  Fact Sheet for Patients:  EntrepreneurPulse.com.au  Fact Sheet for Healthcare Providers:  IncredibleEmployment.be  This test is no t  yet approved or cleared by the [place] and  has been authorized for detection and/or diagnosis of SARS-CoV-2 by FDA under an Emergency Use Authorization (EUA). This EUA will remain  in effect (meaning this test can be used) for the duration of the COVID-19 declaration under Section 564(b)(1) of the Act, 21 U.S.C.section 360bbb-3(b)(1), unless the authorization is terminated  or revoked sooner.       Influenza A by PCR NEGATIVE NEGATIVE Final   Influenza B by PCR NEGATIVE NEGATIVE Final    Comment: (NOTE) The Xpert Xpress SARS-CoV-2/FLU/RSV plus assay is intended as an aid in the diagnosis of influenza from Nasopharyngeal swab specimens and should not be used as a sole basis for treatment. Nasal washings and aspirates are unacceptable for Xpert Xpress SARS-CoV-2/FLU/RSV testing.  Fact Sheet for Patients: EntrepreneurPulse.com.au  Fact Sheet for Healthcare Providers: IncredibleEmployment.be  This test is not yet approved or cleared by the [place] FDA and has been  authorized for detection and/or diagnosis of SARS-CoV-2 by FDA under an Emergency Use Authorization (EUA). This EUA will remain in effect (meaning this test can be used) for the duration of the COVID-19 declaration under Section 564(b)(1) of the Act, 21 U.S.C. section 360bbb-3(b)(1), unless the authorization is terminated or revoked.  Performed at [facility], [address], [place] [address]   Respiratory (~20 pathogens) panel by PCR     Status: None   Collection Time: 11/15/21  5:30 PM   Specimen: Nasopharyngeal Swab; Respiratory  Result Value Ref Range Status   Adenovirus NOT DETECTED NOT DETECTED Final   Coronavirus 229E NOT DETECTED NOT DETECTED Final    Comment: (NOTE) The Coronavirus on the Respiratory Panel, DOES NOT test for the novel  Coronavirus (2019 nCoV)    Coronavirus HKU1 NOT DETECTED NOT DETECTED Final   Coronavirus NL63 NOT DETECTED NOT DETECTED Final   Coronavirus OC43 NOT DETECTED NOT DETECTED Final   Metapneumovirus NOT DETECTED NOT DETECTED Final   Rhinovirus / Enterovirus NOT DETECTED NOT DETECTED Final   Influenza A NOT DETECTED NOT DETECTED Final   Influenza B NOT DETECTED NOT DETECTED Final   Parainfluenza Virus 1 NOT DETECTED NOT DETECTED Final   Parainfluenza Virus 2 NOT DETECTED NOT DETECTED Final   Parainfluenza Virus 3 NOT DETECTED NOT DETECTED Final   Parainfluenza Virus 4 NOT DETECTED NOT DETECTED Final   Respiratory Syncytial Virus NOT DETECTED NOT DETECTED Final   Bordetella pertussis NOT DETECTED NOT DETECTED Final   Bordetella Parapertussis NOT DETECTED NOT DETECTED Final   Chlamydophila pneumoniae NOT DETECTED NOT DETECTED Final   Mycoplasma pneumoniae NOT DETECTED NOT DETECTED Final    Comment: Performed at [facility], [place]. [address] [place], [place] [address]    Radiology Studies:  No results found.  Scheduled Meds:    amiodarone  200 mg Oral BID   aspirin EC  81 mg Oral Daily   citalopram  20 mg Oral  Daily   fluticasone furoate-vilanterol  1 puff Inhalation Daily   And   umeclidinium bromide  1 puff Inhalation Daily   heparin  5,000 Units Subcutaneous Q8H   hydrochlorothiazide  12.5 mg Oral Daily   insulin aspart  0-6 Units Subcutaneous TID WC   lisinopril  40 mg Oral Daily   methylPREDNISolone (SOLU-MEDROL) injection  40 mg Intravenous Q24H   metoprolol tartrate  25 mg Oral BID   montelukast  10 mg Oral QHS   pantoprazole  40 mg Oral Daily  sodium chloride flush  3 mL Intravenous Q12H   sucralfate  1 g Oral QID    Continuous Infusions:    cefTRIAXone (ROCEPHIN)  IV 1 g (11/19/21 1733)     LOS: 6 days     Vernell Leep, MD,  FACP, [facility], [facility], [facility] (Care Management Physician Certified) [place]  To contact the attending provider between 7A-7P or the covering provider during after hours 7P-7A, please log into the web site [email or web address] and access using universal [place] password for that web site. If you do not have the password, please call the hospital operator.  11/20/2021, 3:41 PM

## 2021-11-20 NOTE — Progress Notes (Signed)
PULMONOLOGY         Date: 11/20/2021,   MRN# [id number] ALEEZA [place] 1933-01-03     AdmissionWeight: 50.5 kg                 CurrentWeight: 50.8 kg   Referring physician: Dr Algis Liming   CHIEF COMPLAINT:   Acute on chronic hypoxemic respiratory failure   HISTORY OF PRESENT ILLNESS   This is an 88 yo F with hx of chronic lung disease with Asthma COPD overlap syndrome (ACOS) and chronic hypoxemia on 4L/min [place] as well as RA , GERD, HTN. She does have husband at bedside and he is able to help with details of history.   Patient reports that home RN was able to evaluate her and noted that she was in distress with labored breathing and asked for patient to be seen in ER.   On admission to ER she required BIPAP 10/5 40% FioO2.   She had chest imaging done via CXR with no grossly appreciable infiltrate with chronic opacification due to breat implants.  CT angio done Aug 2022 reviewed independently which does show chronic bronchitic changes with crowding of right basal bronchial segments.  No fibrotic changes to suggest RA-ILD.   Blood work with AKI and hypoalbuminemia with hypokalemia.    She received IV fluids and after this the CBC did show dilutional effects with anemia and other cell lines reduced in parallel which is expected. Patient denies having hemoptysis but did report loose stools unsure if there is blood in it.   She does have leukocytosis on arrival. She was placed on heparin due to notable troponin elevation and had cardiology evaluation while in ER.   11/16/21- patient is improved, I reviewed CT chest findings with her and husband at bedside.   11/17/21- patient with desaturation on mild exertion. She has consolidated LLL and would benefit from mcuolytic neb therapy as well a more aggressive BPH with metaneb to optimize for dc home. Revewed plan with Attending physician today. Met with daughter to review medical plan   11/18/21- patient is still feeling bad but  coughing up phlegm.   CXR is minimally improved.  Anemia is progressively worse over past year and she had anal cancer before , query regarding GI loss , she reports multiple stools daily. FOBT ordered twice but not done.  Steroids and lasix ongoing. She is bringing up copious phlegm on expectoration.             11/19/21-patient is stable without overnight events.  Plan to continue zithrmoax and rocephin x 10d and steroids with weaning reduced dose to 50m.  11/20/21- patient feels stamina and energy is improved. She is able to get OOB to bathroom instead of using bedpan. She is now taking tidal volume 750cc on incentive spirometry. She had multiple loose stools with blood grossly notable per patient.  Anemia has worsened over past 761m from Hb 11.8 to 8.   She is currently on asa and heparin [place] which may potentiate GI blood loss.  She reports external hemorroids and hx of rectal Cancer. FOBT negative x1 , will repeat and have discussed medical plan with RN.   PAST MEDICAL HISTORY   Past Medical History:  Diagnosis Date   Anxiety    Asthma    CHF (congestive heart failure) ([facility]   COPD (chronic obstructive pulmonary disease) ([place]   Hypertension    [place]   scalp (2007) & leg (2013)  Myocardial infarction ([place]) 09/10/1997   no PCI or CABG   Osteoporosis    Rheumatoid arthritis ([place])    SVT (supraventricular tachycardia) ([place])      SURGICAL HISTORY   Past Surgical History:  Procedure Laterality Date   ABDOMINAL HYSTERECTOMY     BREAST SURGERY     CARDIAC SURGERY     CARPAL TUNNEL RELEASE     MELANOMA EXCISION  2007   scalp   MELANOMA EXCISION Right 2013   lower leg   SHOULDER SURGERY     TOTAL HIP REVISION  2015   VIDEO BRONCHOSCOPY Bilateral 04/08/2014   Procedure: VIDEO BRONCHOSCOPY WITHOUT FLUORO;  Surgeon: Juanito Doom, MD;  Location: [facility];  Service: Cardiopulmonary;  Laterality: Bilateral;     FAMILY HISTORY   History reviewed. No  pertinent family history.   SOCIAL HISTORY   Social History   Tobacco Use   Smoking status: Former    Packs/day: 1.50    Years: 22.00    Pack years: 33.00    Types: Cigarettes    Quit date: 04/07/1974    Years since quitting: 47.6   Smokeless tobacco: Never  Substance Use Topics   Alcohol use: No   Drug use: No     MEDICATIONS    Home Medication:     Current Medication:  Current Facility-Administered Medications:    acetaminophen (TYLENOL) tablet 650 mg, 650 mg, Oral, Q6H PRN, [place], Kathe Becton, MD, 650 mg at 11/19/21 0112   albuterol (PROVENTIL) (2.5 MG/3ML) 0.083% nebulizer solution 2.5 mg, 2.5 mg, Nebulization, Q2H PRN, Ottie Glazier, MD   amiodarone (PACERONE) tablet 200 mg, 200 mg, Oral, BID, Tang, Lily Michelle, PA-C, 200 mg at 11/20/21 0941   aspirin EC tablet 81 mg, 81 mg, Oral, Daily, Tang, [employer], PA-C, 81 mg at 11/20/21 0941   citalopram (CELEXA) tablet 20 mg, 20 mg, Oral, Daily, Lockie Mola B, RPH, 20 mg at 11/20/21 0941   fluticasone furoate-vilanterol (BREO ELLIPTA) 100-25 MCG/ACT 1 puff, 1 puff, Inhalation, Daily, 1 puff at 11/20/21 0941 **AND** umeclidinium bromide (INCRUSE ELLIPTA) 62.5 MCG/ACT 1 puff, 1 puff, Inhalation, Daily, [place], Kathe Becton, MD, 1 puff at 11/20/21 0941   heparin injection 5,000 Units, 5,000 Units, Subcutaneous, Q8H, Kristopher Oppenheim, DO, 5,000 Units at 11/20/21 0529   hydrochlorothiazide (HYDRODIURIL) tablet 12.5 mg, 12.5 mg, Oral, Daily, Andrez Grime, MD, 12.5 mg at 11/20/21 0941   insulin aspart (novoLOG) injection 0-6 Units, 0-6 Units, Subcutaneous, TID WC, Hongalgi, Lenis Dickinson, MD, 1 Units at 11/19/21 1734   lactobacillus acidophilus (BACID) tablet 2 tablet, 2 tablet, Oral, TID, Lanney Gins, Carlos Quackenbush, MD   lisinopril (ZESTRIL) tablet 40 mg, 40 mg, Oral, Daily, Tang, Lily Michelle, PA-C, 40 mg at 11/20/21 0940   melatonin tablet 5 mg, 5 mg, Oral, QHS PRN, Sharion Settler, NP, 5 mg at 11/19/21 2155   metoprolol tartrate  (LOPRESSOR) tablet 25 mg, 25 mg, Oral, BID, Hongalgi, Anand D, MD, 25 mg at 11/20/21 0941   montelukast (SINGULAIR) tablet 10 mg, 10 mg, Oral, QHS, Pudota, Kingsley P, MD, 10 mg at 11/19/21 2146   nitroGLYCERIN (NITROSTAT) SL tablet 0.4 mg, 0.4 mg, Sublingual, Q5 min PRN, Pud[place], Kathe Becton, MD   pantoprazole (PROTONIX) EC tablet 40 mg, 40 mg, Oral, Daily, Benita Gutter, RPH, 40 mg at 11/20/21 0940   sodium chloride flush (NS) 0.9 % injection 3 mL, 3 mL, Intravenous, Q12H, Hongalgi, Anand D, MD, 3 mL at 11/20/21 0943   sucralfate (CARAFATE) tablet 1  g, 1 g, Oral, QID, Pud[place], Kathe Becton, MD, 1 g at 11/20/21 0941    ALLERGIES   Celebrex [celecoxib] and Morphine and related     REVIEW OF SYSTEMS    Review of Systems:  Gen:  Denies  fever, sweats, chills weigh loss  HEENT: Denies blurred vision, double vision, ear pain, eye pain, hearing loss, nose bleeds, sore throat Cardiac:  No dizziness, chest pain or heaviness, chest tightness,edema Resp:   Denies cough or sputum porduction, shortness of breath,wheezing, hemoptysis,  Gi: Denies swallowing difficulty, stomach pain, nausea or vomiting, diarrhea, constipation, bowel incontinence Gu:  Denies bladder incontinence, burning urine Ext:   Denies Joint pain, stiffness or swelling Skin: Denies  skin rash, easy bruising or bleeding or hives Endoc:  Denies polyuria, polydipsia , polyphagia or weight change Psych:   Denies depression, insomnia or hallucinations   Other:  All other systems negative   VS: BP (!) 155/63 (BP Location: Right Arm)    Pulse (!) 55    Temp 97.9 F (36.6 C)    Resp 19    Ht 5' (1.524 m)    Wt 50.8 kg    SpO2 94%    BMI 21.87 kg/m      PHYSICAL EXAM    GENERAL:NAD, no fevers, chills, no weakness no fatigue HEAD: Normocephalic, atraumatic.  EYES: Pupils equal, round, reactive to light. Extraocular muscles intact. No scleral icterus.  MOUTH: Moist mucosal membrane. Dentition intact. No abscess noted.  EAR,  NOSE, THROAT: Clear without exudates. No external lesions.  NECK: Supple. No thyromegaly. No nodules. No JVD.  PULMONARY:reduced air entry bilaterally  CARDIOVASCULAR: S1 and S2. Regular rate and rhythm. No murmurs, rubs, or gallops. No edema. Pedal pulses 2+ bilaterally.  GASTROINTESTINAL: Soft, nontender, nondistended. No masses. Positive bowel sounds. No hepatosplenomegaly.  MUSCULOSKELETAL: No swelling, clubbing, or edema. Range of motion full in all extremities.  NEUROLOGIC: Cranial nerves II through XII are intact. No gross focal neurological deficits. Sensation intact. Reflexes intact.  SKIN: No ulceration, lesions, rashes, or cyanosis. Skin warm and dry. Turgor intact.  PSYCHIATRIC: Mood, affect within normal limits. The patient is awake, alert and oriented x 3. Insight, judgment intact.       IMAGING    CT Angio Chest Pulmonary Embolism (PE) W or WO Contrast  Result Date: 11/15/2021 CLINICAL DATA:  Chronic respiratory failure EXAM: CT ANGIOGRAPHY CHEST WITH CONTRAST TECHNIQUE: Multidetector CT imaging of the chest was performed using the standard protocol during bolus administration of intravenous contrast. Multiplanar CT image reconstructions and MIPs were obtained to evaluate the vascular anatomy. RADIATION DOSE REDUCTION: This exam was performed according to the departmental dose-optimization program which includes automated exposure control, adjustment of the mA and/or kV according to patient size and/or use of iterative reconstruction technique. CONTRAST:  3m OMNIPAQUE IOHEXOL 300 MG/ML  SOLN COMPARISON:  04/11/2021 FINDINGS: Cardiovascular: Atherosclerotic calcifications of the thoracic aorta are noted. No aneurysmal dilatation or dissection is noted. Mild cardiac enlargement is seen. No pericardial effusion is seen. Coronary calcifications are noted. Pulmonary artery shows a normal branching pattern bilaterally. No intraluminal filling defect is identified. Mediastinum/Nodes:  Thoracic inlet is within normal limits. No sizable hilar or mediastinal adenopathy is noted. The esophagus as visualized is within normal limits. Lungs/Pleura: Diffuse emphysematous changes are noted in the lungs bilaterally stable in appearance from the prior exam. Small effusion is noted on the left with lower lobe infiltrate new from the prior study. No sizable parenchymal nodules are seen. Upper Abdomen: Visualized  upper abdomen is within normal limits. Musculoskeletal: Degenerative changes of the thoracic spine are noted. No definitive rib abnormality is seen. Bilateral breast implants are seen with evidence of stable intracapsular rupture similar to that noted on the prior exam. Review of the MIP images confirms the above findings. IMPRESSION: No evidence of pulmonary emboli. New left lower lobe infiltrate with associated small effusion. Aortic Atherosclerosis (ICD10-I70.0) and Emphysema (ICD10-J43.9). Electronically Signed   By: Inez Catalina M.D.   On: 11/15/2021 21:21   DG Chest Port 1 View  Result Date: 11/18/2021 CLINICAL DATA:  Follow-up pneumonia. EXAM: PORTABLE CHEST 1 VIEW COMPARISON:  11/14/2021 and prior radiographs FINDINGS: UPPER limits normal heart size again noted. LEFT retrocardiac LOWER lung opacity is relatively unchanged. No pneumothorax or large pleural effusion identified. No significant changes are identified. IMPRESSION: Unchanged LEFT LOWER lung opacity which may represent atelectasis/effusion/pneumonia. Electronically Signed   By: Margarette Canada M.D.   On: 11/18/2021 10:28   DG Chest Portable 1 View  Result Date: 11/14/2021 CLINICAL DATA:  Respiratory distress. EXAM: PORTABLE CHEST 1 VIEW COMPARISON:  Chest radiograph dated April 13, 2019 FINDINGS: The heart is enlarged. Atherosclerotic calcification of the aortic arch. Left lower lobe hazy opacity with silhouetting of the left costophrenic angle. Bilateral mild pleural/parenchymal scarring, unchanged. IMPRESSION: 1. Stable  cardiomegaly. 2. Left lower lobe hazy opacity which may represent atelectasis or effusion. Underlying airspace disease can not be excluded. Electronically Signed   By: Keane Police D.O.   On: 11/14/2021 15:59   ECHOCARDIOGRAM COMPLETE  Result Date: 11/15/2021    ECHOCARDIOGRAM REPORT   Patient Name:   CORDIE BEAZLEY [facility] Date of Exam: 11/15/2021 Medical Rec #:  [id number]        Height:       60.0 in Accession #:    [id number]       Weight:       111.3 lb Date of Birth:  1933/01/08         BSA:          1.455 m Patient Age:    89 years         BP:           161/80 mmHg Patient Gender: F                HR:           93 bpm. Exam Location:  [facility] Procedure: 2D Echo, Cardiac Doppler and Color Doppler Indications:     Chest pain R07.9  History:         Patient has prior history of Echocardiogram examinations, most                  recent 02/23/2021. CHF, Previous Myocardial Infarction, COPD;                  Risk Factors:Hypertension. SVT.  Sonographer:     Sherrie Sport Referring Phys:  [id number] [facility] [place] Diagnosing Phys: Donnelly Angelica  Sonographer Comments: Technically challenging study due to limited acoustic windows. Image acquisition challenging due to breast implants and Image acquisition challenging due to COPD. IMPRESSIONS  1. Left ventricular ejection fraction, by estimation, is 55 to 60%. The left ventricle has normal function. The left ventricle has no regional wall motion abnormalities. Left ventricular diastolic parameters are consistent with Grade I diastolic dysfunction (impaired relaxation).  2. Right ventricular systolic function is normal. The right ventricular size is not well visualized.  3. Left atrial size was moderately  dilated.  4. The mitral valve is degenerative. Mild mitral valve regurgitation. No evidence of mitral stenosis. The mean mitral valve gradient is 5.0 mmHg.  5. The aortic valve is calcified. Aortic valve regurgitation is trivial. Aortic valve sclerosis/calcification is present, without  any evidence of aortic stenosis. FINDINGS  Left Ventricle: Left ventricular ejection fraction, by estimation, is 55 to 60%. The left ventricle has normal function. The left ventricle has no regional wall motion abnormalities. The left ventricular internal cavity size was normal in size. There is  no left ventricular hypertrophy. Left ventricular diastolic parameters are consistent with Grade I diastolic dysfunction (impaired relaxation). Right Ventricle: The right ventricular size is not well visualized. Right vetricular wall thickness was not well visualized. Right ventricular systolic function is normal. Left Atrium: Left atrial size was moderately dilated. Right Atrium: Right atrial size was not well visualized. Pericardium: There is no evidence of pericardial effusion. Mitral Valve: The mitral valve is degenerative in appearance. Mild mitral valve regurgitation. No evidence of mitral valve stenosis. MV peak gradient, 9.6 mmHg. The mean mitral valve gradient is 5.0 mmHg. Tricuspid Valve: The tricuspid valve is normal in structure. Tricuspid valve regurgitation is trivial. Aortic Valve: The aortic valve is calcified. Aortic valve regurgitation is trivial. Aortic valve sclerosis/calcification is present, without any evidence of aortic stenosis. Aortic valve mean gradient measures 4.0 mmHg. Aortic valve peak gradient measures 7.7 mmHg. Aortic valve area, by VTI measures 1.97 cm. Pulmonic Valve: The pulmonic valve was not well visualized. Pulmonic valve regurgitation is not visualized. Aorta: The aortic root is normal in size and structure. IAS/Shunts: The interatrial septum was not well visualized.  LEFT VENTRICLE PLAX 2D LVIDd:         4.30 cm   Diastology LVIDs:         3.10 cm   LV e' medial:    4.03 cm/s LV PW:         1.10 cm   LV E/e' medial:  2.0 LV IVS:        1.10 cm   LV e' lateral:   7.34 cm/s LVOT diam:     2.00 cm   LV E/e' lateral: 1.1 LV SV:         51 LV SV Index:   35 LVOT Area:     3.14 cm  RIGHT  VENTRICLE RV S prime:     11.20 cm/s TAPSE (M-mode): 2.0 cm LEFT ATRIUM             Index        RIGHT ATRIUM           Index LA diam:        3.60 cm 2.47 cm/m   RA Area:     14.90 cm LA Vol (A2C):   72.1 ml 49.55 ml/m  RA Volume:   30.50 ml  20.96 ml/m LA Vol (A4C):   49.3 ml 33.88 ml/m LA Biplane Vol: 59.7 ml 41.02 ml/m  AORTIC VALVE                    PULMONIC VALVE AV Area (Vmax):    2.21 cm     PV Vmax:        0.56 m/s AV Area (Vmean):   1.79 cm     PV Vmean:       38.300 cm/s AV Area (VTI):     1.97 cm     PV VTI:  0.114 m AV Vmax:           139.00 cm/s  PV Peak grad:   1.3 mmHg AV Vmean:          94.400 cm/s  PV Mean grad:   1.0 mmHg AV VTI:            0.257 m      RVOT Peak grad: 6 mmHg AV Peak Grad:      7.7 mmHg AV Mean Grad:      4.0 mmHg LVOT Vmax:         97.90 cm/s LVOT Vmean:        53.700 cm/s LVOT VTI:          0.161 m LVOT/AV VTI ratio: 0.63  AORTA Ao Root diam: 2.90 cm MITRAL VALVE                TRICUSPID VALVE MV Area (PHT): 4.31 cm     TR Peak grad:   10.8 mmHg MV Peak grad:  9.6 mmHg     TR Vmax:        164.00 cm/s MV Mean grad:  5.0 mmHg MV Vmax:       1.55 m/s     SHUNTS MV Vmean:      103.0 cm/s   Systemic VTI:  0.16 m MV Decel Time: 176 msec     Systemic Diam: 2.00 cm MV E velocity: 8.05 cm/s    Pulmonic VTI:  0.252 m MV A velocity: 133.00 cm/s MV E/A ratio:  0.06 Donnelly Angelica Electronically signed by Donnelly Angelica Signature Date/Time: 11/15/2021/5:13:23 PM    Final          ASSESSMENT/PLAN   Acute on chronic hypoxemic respiratory failure -etiology differential includes acute COPD exacerbation vs viral LRTI vs atypical pneumonia , vs acute blood loss anemia vs acute on chronic diastolic CHF - present on admission  - COVID19 negative   - supplemental O2 during my evaluation 5L/min  - nfectious workup for pneumonia as below -Respiratory viral panel-negative -serum fungitell -legionella ab -strep pneumoniae ur AG -Histoplasma Ur Ag -sputum resp  cultures -reviewed pertinent imaging with patient today - ESR/CRP -severely elevated  -PT/OT for d/c planning  -please encourage patient to use incentive spirometer few times each hour while hospitalized.   Metaneb and mucomyst for BPH -changed steroids to PO pred and dcd antibiotics since today is day 7.  PT/OT continuing.   Anemia    Trending down H/h     - fecal occult blood stool - patient reports loose stool      -currently on heparin - consider DC if not needed as cardiology thinks its unlikely ACS   Acute on chronic diastolic CHF    In context of AE COPD vs LRTI   -BNP-significantly elevated     - cardiology consult - appreciate input - Dr Corky Sox and Orinda Kenner     - s/p TTE - reviewed no severe findings   Acute exacerbation of COPD     -Wean down IV solumedrol reduced to 40 bid from 120 now starting prednisone    -Continue rocephin zithromax for now     -PRN duoneb   - Incruse ellipta   - Breo once daily    - IS and Flutter several times daily    Severe GERD   -Carafate     Thank you for allowing me to participate in the care of this patient.  Patient has at least 1 severe  active medical problem which is life threatening and is being managed during this evaluation  Patient/Family are satisfied with care plan and all questions have been answered.  This document was prepared using Dragon voice recognition software and may include unintentional dictation errors.     Ottie Glazier, M.D.  Division of [place]

## 2021-11-20 NOTE — Progress Notes (Signed)
Mobility Specialist - Progress Note ? ? 11/20/21 1134  ?Mobility  ?Activity Transferred to/from [facility]  ?Range of Motion/Exercises Right leg;Left leg  ?Level of Assistance Contact guard assist, steadying assist  ?Assistive Device BSC  ?Distance Ambulated (ft) 2 ft  ?Activity Response Tolerated well  ?$Mobility charge 1 Mobility  ? ? ?Pt attempting to get to [facility] upon arrival with assist from family. Some stool does miss toilet, mobility assisted with cleaning floors for safe transfer. CGA assist to stand and pivot without AD. Mobility finished cleaning remaining stool and provided assist for more thorough peri-care. Pt performed STS 2 additional times as new mesh underwear was provided with assist to don. Fatigued from activity. Pt returned supine with alarm set.  ? ? ?Kathee Delton ?Mobility Specialist ?11/20/21, 12:00 PM ? ?

## 2021-11-20 NOTE — Progress Notes (Addendum)
[place] NOTE       Patient ID: [name] MRN: [id number] DOB/AGE: 05-15-1933 88 y.o.  Admit date: 11/14/2021 Referring Physician Dr. Algis Liming Primary Physician Lloyd Huger  Primary Cardiologist in [place] (last seen 1 year ago)  Reason for Consultation atypical chest pain   HPI: The patient is an 88 year old female with a past medical history notable for HFpEF (LVEF 55-60% with myxomatous MV, mild AR with g3DD), history of MI 1999, COPD on 4 L at baseline, hypertension, rheumatoid arthritis who presented to [facility] ED with shortness of breath and productive cough for 2 to 3 days.  She presented hypoxic to the 70s and severely dyspneic requiring BiPAP initially, discovered to have pneumonia.  Cardiology was consulted because of her elevated troponin and chest pain.  Interval History: - No further sustained episodes of SVT since starting amiodarone.  - continues to feel better.  - Worked with PT and felt well. Baseline oxygen requirement.   Review of systems complete and found to be negative unless listed above     Past Medical History:  Diagnosis Date   Anxiety    Asthma    CHF (congestive heart failure) (HCC)    COPD (chronic obstructive pulmonary disease) ([place])    Hypertension    Melanoma ([place])    scalp (2007) & leg (2013)   Myocardial infarction ([place]) 09/10/1997   no PCI or CABG   Osteoporosis    Rheumatoid arthritis (HCC)    SVT (supraventricular tachycardia) ([place])     Past Surgical History:  Procedure Laterality Date   ABDOMINAL HYSTERECTOMY     BREAST SURGERY     CARDIAC SURGERY     CARPAL TUNNEL RELEASE     MELANOMA EXCISION  2007   scalp   MELANOMA EXCISION Right 2013   lower leg   SHOULDER SURGERY     TOTAL HIP REVISION  2015   VIDEO BRONCHOSCOPY Bilateral 04/08/2014   Procedure: VIDEO BRONCHOSCOPY WITHOUT FLUORO;  Surgeon: Juanito Doom, MD;  Location: [facility] ENDOSCOPY;  Service: Cardiopulmonary;  Laterality: Bilateral;     Medications Prior to Admission  Medication Sig Dispense Refill Last Dose   acetaminophen (TYLENOL) 325 MG tablet Take 650 mg by mouth every 6 (six) hours as needed.   11/14/2021   albuterol (PROVENTIL) (2.5 MG/3ML) 0.083% nebulizer solution Inhale 3 mLs into the lungs every 6 (six) hours as needed.   11/14/2021   Cholecalciferol (VITAMIN D PO) Take 25 mcg by mouth daily.   11/14/2021   citalopram (CELEXA) 20 MG tablet Take 20 mg by mouth daily.   11/14/2021   fish oil-omega-3 fatty acids 1000 MG capsule Take 2 g by mouth daily.   11/14/2021   hydrochlorothiazide (HYDRODIURIL) 12.5 MG tablet Take 12.5 mg by mouth daily.   11/14/2021   ipratropium-albuterol (DUONEB) 0.5-2.5 (3) MG/3ML SOLN Take 3 mLs by nebulization every 6 (six) hours as needed.   11/13/2021   lisinopril (ZESTRIL) 40 MG tablet Take 40 mg by mouth daily.   11/14/2021   montelukast (SINGULAIR) 10 MG tablet Take 10 mg by mouth at bedtime.   11/13/2021   omeprazole (PRILOSEC) 20 MG capsule Take 20 mg by mouth 2 (two) times daily before a meal.   11/14/2021   sucralfate (CARAFATE) 1 g tablet Take 1 g by mouth 4 (four) times daily.   11/13/2021   traMADol (ULTRAM) 50 MG tablet Take 50 mg by mouth 2 (two) times daily as needed.   11/13/2021  TRELEGY ELLIPTA 100-62.5-25 MCG/INH AEPB Take 1 puff by mouth daily.   11/14/2021   acetaminophen-codeine (TYLENOL #3) 300-30 MG tablet Take 1 tablet by mouth every 4 (four) hours as needed. (Patient not taking: Reported on 04/20/2021)      alendronate (FOSAMAX) 70 MG tablet Take 70 mg by mouth once a week.   11/10/2021   bisoprolol (ZEBETA) 5 MG tablet Take 0.5 tablets (2.5 mg total) by mouth at bedtime. (Patient not taking: Reported on 11/14/2021) 30 tablet 0 Not Taking   diltiazem (CARDIZEM CD) 180 MG 24 hr capsule Take 1 capsule (180 mg total) by mouth daily. (Patient not taking: Reported on 11/14/2021) 30 capsule 0 Not Taking   furosemide (LASIX) 20 MG tablet Take 1 tablet (20 mg total) by mouth daily. (Patient not taking:  Reported on 11/14/2021) 30 tablet 0 Not Taking   nitroGLYCERIN (NITROSTAT) 0.4 MG SL tablet Place 1 tablet (0.4 mg total) under the tongue every 5 (five) minutes as needed for chest pain. 30 tablet 3 prn   potassium chloride SA (KLOR-CON) 20 MEQ tablet Take 1 tablet (20 mEq total) by mouth daily. (Patient not taking: Reported on 11/14/2021) 30 tablet 0 Not Taking   tizanidine (ZANAFLEX) 2 MG capsule Take 2 mg by mouth 3 (three) times daily. (Patient not taking: Reported on 11/14/2021)   Not Taking    Social History   Socioeconomic History   Marital status: Married    Spouse name: Not on file   Number of children: Not on file   Years of education: Not on file   Highest education level: Not on file  Occupational History   Not on file  Tobacco Use   Smoking status: Former    Packs/day: 1.50    Years: 22.00    Pack years: 33.00    Types: Cigarettes    Quit date: 04/07/1974    Years since quitting: 47.6   Smokeless tobacco: Never  Substance and Sexual Activity   Alcohol use: No   Drug use: No   Sexual activity: Never  Other Topics Concern   Not on file  Social History Narrative   Lives in [place].  Originally from [place].  Children live in [place].   Social Determinants of Health   Financial Resource Strain: Not on file  Food Insecurity: Not on file  Transportation Needs: Not on file  Physical Activity: Not on file  Stress: Not on file  Social Connections: Not on file  Intimate Partner Violence: Not on file    History reviewed. No pertinent family history.    Review of systems complete and found to be negative unless listed above    PHYSICAL EXAM General: Elderly appearing Caucasian female, well nourished, in no acute distress.  Sitting upright in PCU bed. HEENT:  Normocephalic and atraumatic. Neck:  No JVD.  Lungs: Normal work of breathing on O2 by nasal cannula.  Poor air movement, without crackles, wheezes Heart: HRRR . Normal S1 and S2.  3/6 systolic murmur heard  at the RUSB and apex  Chest: anterior chest wall with tenderness to palpation Abdomen: Non-distended appearing.  Msk: Normal strength and tone for age. Extremities: Chronic skin discoloration in bilateral lower extremities likely 2/2 arterial insufficiency.  No edema. Neuro: Alert and oriented X 3. Psych:  Answers questions appropriately.   Labs:   Lab Results  Component Value Date   WBC 12.5 (H) 11/19/2021   HGB 8.4 (L) 11/19/2021   HCT 26.9 (L) 11/19/2021   MCV 82.3 11/19/2021  PLT 357 11/19/2021    Recent Labs  Lab 11/15/21 0418 11/16/21 0440 11/19/21 0420  NA 135   < > 135  K 3.2*   < > 3.6  CL 100   < > 100  CO2 27   < > 29  BUN 45*   < > 32*  CREATININE 1.02*   < > 0.92  CALCIUM 8.2*   < > 8.5*  PROT 5.9*  --   --   BILITOT 0.3  --   --   ALKPHOS 54  --   --   ALT 13  --   --   AST 23  --   --   GLUCOSE 174*   < > 101*   < > = values in this interval not displayed.    Lab Results  Component Value Date   TROPONINI <0.30 02/23/2013    No results found for: CHOL No results found for: HDL No results found for: LDLCALC No results found for: TRIG No results found for: CHOLHDL No results found for: LDLDIRECT    Radiology: CT Angio Chest Pulmonary Embolism (PE) W or WO Contrast  Result Date: 11/15/2021 CLINICAL DATA:  Chronic respiratory failure EXAM: CT ANGIOGRAPHY CHEST WITH CONTRAST TECHNIQUE: Multidetector CT imaging of the chest was performed using the standard protocol during bolus administration of intravenous contrast. Multiplanar CT image reconstructions and MIPs were obtained to evaluate the vascular anatomy. RADIATION DOSE REDUCTION: This exam was performed according to the departmental dose-optimization program which includes automated exposure control, adjustment of the mA and/or kV according to patient size and/or use of iterative reconstruction technique. CONTRAST:  51m OMNIPAQUE IOHEXOL 300 MG/ML  SOLN COMPARISON:  04/11/2021 FINDINGS:  Cardiovascular: Atherosclerotic calcifications of the thoracic aorta are noted. No aneurysmal dilatation or dissection is noted. Mild cardiac enlargement is seen. No pericardial effusion is seen. Coronary calcifications are noted. Pulmonary artery shows a normal branching pattern bilaterally. No intraluminal filling defect is identified. Mediastinum/Nodes: Thoracic inlet is within normal limits. No sizable hilar or mediastinal adenopathy is noted. The esophagus as visualized is within normal limits. Lungs/Pleura: Diffuse emphysematous changes are noted in the lungs bilaterally stable in appearance from the prior exam. Small effusion is noted on the left with lower lobe infiltrate new from the prior study. No sizable parenchymal nodules are seen. Upper Abdomen: Visualized upper abdomen is within normal limits. Musculoskeletal: Degenerative changes of the thoracic spine are noted. No definitive rib abnormality is seen. Bilateral breast implants are seen with evidence of stable intracapsular rupture similar to that noted on the prior exam. Review of the MIP images confirms the above findings. IMPRESSION: No evidence of pulmonary emboli. New left lower lobe infiltrate with associated small effusion. Aortic Atherosclerosis (ICD10-I70.0) and Emphysema (ICD10-J43.9). Electronically Signed   By: MInez CatalinaM.D.   On: 11/15/2021 21:21   DG Chest Port 1 View  Result Date: 11/18/2021 CLINICAL DATA:  Follow-up pneumonia. EXAM: PORTABLE CHEST 1 VIEW COMPARISON:  11/14/2021 and prior radiographs FINDINGS: UPPER limits normal heart size again noted. LEFT retrocardiac LOWER lung opacity is relatively unchanged. No pneumothorax or large pleural effusion identified. No significant changes are identified. IMPRESSION: Unchanged LEFT LOWER lung opacity which may represent atelectasis/effusion/pneumonia. Electronically Signed   By: JMargarette CanadaM.D.   On: 11/18/2021 10:28   DG Chest Portable 1 View  Result Date:  11/14/2021 CLINICAL DATA:  Respiratory distress. EXAM: PORTABLE CHEST 1 VIEW COMPARISON:  Chest radiograph dated April 13, 2019 FINDINGS: The heart is enlarged.  Atherosclerotic calcification of the aortic arch. Left lower lobe hazy opacity with silhouetting of the left costophrenic angle. Bilateral mild pleural/parenchymal scarring, unchanged. IMPRESSION: 1. Stable cardiomegaly. 2. Left lower lobe hazy opacity which may represent atelectasis or effusion. Underlying airspace disease can not be excluded. Electronically Signed   By: Keane Police D.O.   On: 11/14/2021 15:59   ECHOCARDIOGRAM COMPLETE  Result Date: 11/15/2021    ECHOCARDIOGRAM REPORT   Patient Name:   BERDENE ASKARI [facility] Date of Exam: 11/15/2021 Medical Rec #:  [id number]        Height:       60.0 in Accession #:    [id number]       Weight:       111.3 lb Date of Birth:  1932-11-08         BSA:          1.455 m Patient Age:    72 years         BP:           161/80 mmHg Patient Gender: F                HR:           93 bpm. Exam Location:  [facility] Procedure: 2D Echo, Cardiac Doppler and Color Doppler Indications:     Chest pain R07.9  History:         Patient has prior history of Echocardiogram examinations, most                  recent 02/23/2021. CHF, Previous Myocardial Infarction, COPD;                  Risk Factors:Hypertension. SVT.  Sonographer:     Sherrie Sport Referring Phys:  [id number] [facility] [place] Diagnosing Phys: Donnelly Angelica  Sonographer Comments: Technically challenging study due to limited acoustic windows. Image acquisition challenging due to breast implants and Image acquisition challenging due to COPD. IMPRESSIONS  1. Left ventricular ejection fraction, by estimation, is 55 to 60%. The left ventricle has normal function. The left ventricle has no regional wall motion abnormalities. Left ventricular diastolic parameters are consistent with Grade I diastolic dysfunction (impaired relaxation).  2. Right ventricular systolic function is normal. The  right ventricular size is not well visualized.  3. Left atrial size was moderately dilated.  4. The mitral valve is degenerative. Mild mitral valve regurgitation. No evidence of mitral stenosis. The mean mitral valve gradient is 5.0 mmHg.  5. The aortic valve is calcified. Aortic valve regurgitation is trivial. Aortic valve sclerosis/calcification is present, without any evidence of aortic stenosis. FINDINGS  Left Ventricle: Left ventricular ejection fraction, by estimation, is 55 to 60%. The left ventricle has normal function. The left ventricle has no regional wall motion abnormalities. The left ventricular internal cavity size was normal in size. There is  no left ventricular hypertrophy. Left ventricular diastolic parameters are consistent with Grade I diastolic dysfunction (impaired relaxation). Right Ventricle: The right ventricular size is not well visualized. Right vetricular wall thickness was not well visualized. Right ventricular systolic function is normal. Left Atrium: Left atrial size was moderately dilated. Right Atrium: Right atrial size was not well visualized. Pericardium: There is no evidence of pericardial effusion. Mitral Valve: The mitral valve is degenerative in appearance. Mild mitral valve regurgitation. No evidence of mitral valve stenosis. MV peak gradient, 9.6 mmHg. The mean mitral valve gradient is 5.0 mmHg. Tricuspid Valve: The tricuspid valve is normal in structure.  Tricuspid valve regurgitation is trivial. Aortic Valve: The aortic valve is calcified. Aortic valve regurgitation is trivial. Aortic valve sclerosis/calcification is present, without any evidence of aortic stenosis. Aortic valve mean gradient measures 4.0 mmHg. Aortic valve peak gradient measures 7.7 mmHg. Aortic valve area, by VTI measures 1.97 cm. Pulmonic Valve: The pulmonic valve was not well visualized. Pulmonic valve regurgitation is not visualized. Aorta: The aortic root is normal in size and structure. IAS/Shunts:  The interatrial septum was not well visualized.  LEFT VENTRICLE PLAX 2D LVIDd:         4.30 cm   Diastology LVIDs:         3.10 cm   LV e' medial:    4.03 cm/s LV PW:         1.10 cm   LV E/e' medial:  2.0 LV IVS:        1.10 cm   LV e' lateral:   7.34 cm/s LVOT diam:     2.00 cm   LV E/e' lateral: 1.1 LV SV:         51 LV SV Index:   35 LVOT Area:     3.14 cm  RIGHT VENTRICLE RV S prime:     11.20 cm/s TAPSE (M-mode): 2.0 cm LEFT ATRIUM             Index        RIGHT ATRIUM           Index LA diam:        3.60 cm 2.47 cm/m   RA Area:     14.90 cm LA Vol (A2C):   72.1 ml 49.55 ml/m  RA Volume:   30.50 ml  20.96 ml/m LA Vol (A4C):   49.3 ml 33.88 ml/m LA Biplane Vol: 59.7 ml 41.02 ml/m  AORTIC VALVE                    PULMONIC VALVE AV Area (Vmax):    2.21 cm     PV Vmax:        0.56 m/s AV Area (Vmean):   1.79 cm     PV Vmean:       38.300 cm/s AV Area (VTI):     1.97 cm     PV VTI:         0.114 m AV Vmax:           139.00 cm/s  PV Peak grad:   1.3 mmHg AV Vmean:          94.400 cm/s  PV Mean grad:   1.0 mmHg AV VTI:            0.257 m      RVOT Peak grad: 6 mmHg AV Peak Grad:      7.7 mmHg AV Mean Grad:      4.0 mmHg LVOT Vmax:         97.90 cm/s LVOT Vmean:        53.700 cm/s LVOT VTI:          0.161 m LVOT/AV VTI ratio: 0.63  AORTA Ao Root diam: 2.90 cm MITRAL VALVE                TRICUSPID VALVE MV Area (PHT): 4.31 cm     TR Peak grad:   10.8 mmHg MV Peak grad:  9.6 mmHg     TR Vmax:        164.00 cm/s MV Mean grad:  5.0  mmHg MV Vmax:       1.55 m/s     SHUNTS MV Vmean:      103.0 cm/s   Systemic VTI:  0.16 m MV Decel Time: 176 msec     Systemic Diam: 2.00 cm MV E velocity: 8.05 cm/s    Pulmonic VTI:  0.252 m MV A velocity: 133.00 cm/s MV E/A ratio:  0.06 Donnelly Angelica Electronically signed by Donnelly Angelica Signature Date/Time: 11/15/2021/5:13:23 PM    Final     ECHO 02/2021  1. Left ventricular ejection fraction, by estimation, is 55 to 60%. The  left ventricle has normal function. The left ventricle has  no regional  wall motion abnormalities. Left ventricular diastolic parameters are  consistent with Grade III diastolic  dysfunction (restrictive).   2. Right ventricular systolic function is normal. The right ventricular  size is normal.   3. The mitral valve is myxomatous. Trivial mitral valve regurgitation.   4. The tricuspid valve is myxomatous. Tricuspid valve regurgitation is  mild to moderate.   5. The aortic valve is grossly normal. Aortic valve regurgitation is  mild. Mild to moderate aortic valve sclerosis/calcification is present,  without any evidence of aortic stenosis.   TELEMETRY reviewed by me: Normal sinus rhythm with rate 60s-70s, multiple periods of atrial tachycardia vs SVT on tele this morning, longest lasting 27 mins.   EKG reviewed by me: Initial EKG 3/7 at 1544 showed sinus rhythm rate 95 PVCs, LAFB, RBBB repeat EKG 3/8 showed sinus tach rate 137  ASSESSMENT AND PLAN:  The patient is an 88 year old female with a past medical history notable for HFpEF (LVEF 55-60% with myxomatous MV, mild AR with g3DD), history of MI 1999, COPD on 4 L at baseline, hypertension, rheumatoid arthritis who presented to [facility] ED with shortness of breath and productive cough for 2 to 3 days.  She presented hypoxic to the 70s and severely dyspneic requiring BiPAP initially.  Cardiology was consulted because of her elevated troponin and chest pain.  #Acute hypoxic respiratory failure 2/2 CAP/COPD (baseline 4 L) #Atypical chest pain The patient presents with a 3-day history of "feeling bad" with increased cough and central chest pain that is nonradiating, nonpleuritic, nonexertional and, intermittent, reproducible with palpation.  She was discovered to have pneumonia with elevated procalcitonin.  Her troponin was mildly elevated which is due to demand ischemia.  She does not appear grossly volume overloaded, but we have initiated diuresis on 11/18/2021 to help with her persistent shortness of  breath. -Agree with treatment of primary infection -Wean oxygen as tolerated, on 4 L at baseline. - Continue '81mg'$  ASA daily.  - continue metoprolol - increase lisinopril to '40mg'$  today  -s/p heparin gtt, ended 3/8 - Lasix 40 mg IV yesterday. Continue home HCTZ 12.'5mg'$   -Continuous monitoring on telemetry while inpatient -Defer invasive cardiac diagnostics.  # Paroxysmal SVT Frequent episodes of SVT since admission, appears to be consistent with atrial tachycardia (onset with PAC) that persist for as long as 30 minutes. Less frequent and much shorter in duration since amiodarone initiated. - Continue metoprolol 25 mg BID - Low HR- Decrease amiodarone 200 mg BID for 5 gram load (10 days), then 200 mg daily  #HFpEF (LVEF 55-60% w G1dd, mild MR, aortic sclerosis) Persistently SOB despite treatment of infection.  - Improved after lasix 3/11; continue home HCTZ 12.'5mg'$    #Hypertension #CAD with history of MI 1999 -Increase lisinoprol to home dose of '40mg'$  - Resume home HCTZ 12.5 mg.  -She will  need to establish care with cardiology after discharge.  #AKI, resolved Creatinine on admission 1.5 GFR 34, back to 0.82 and >60.   This patient's plan of care was discussed and created with Dr. Serafina Royals and he is in agreement.    Signed: Tristan Schroeder, PA-C 11/20/2021, 11:28 AM [facility]

## 2021-11-20 NOTE — Care Management Important Message (Signed)
Important Message ? ?Patient Details  ?Name: [name] ?MRN: [id number] ?Date of Birth: 07/14/1933 ? ? ?Medicare Important Message Given:  Yes ? ? ? ? ?Dannette Barbara ?11/20/2021, 12:59 PM ?

## 2021-11-20 NOTE — Consult Note (Cosign Needed)
Consultation Note Date: 11/20/2021   Patient Name: [name]  DOB: 14-Dec-1932  MRN: [id number]  Age / Sex: 88 y.o., female  PCP: Amy Barrios, MD Referring Physician: Modena Jansky, MD  Reason for Consultation: Establishing goals of care  HPI/Patient Profile: 88 y.o. female  with past medical history of coronary artery disease, CHF, HTN, COPD with chronic respiratory failure/4 L, RA, osteoporosis, GERD, anxiety, history of melanoma on the scalp '07 and leg '13, MI 1999 with no PCI or CABG, abdominal hysterectomy, breast augmentation, 33-pack-year history of smoking stopped 47 years ago, admitted on 11/14/2021 with acute on chronic respiratory failure with hypoxia.   Clinical Assessment and Goals of Care: I have reviewed medical records including EPIC notes, labs and imaging, received report from RN, assessed the patient and then met at the bedside along with spouse, [name], to discuss diagnosis prognosis, GOC, EOL wishes, disposition and options.   [name] is sitting up in the bed.  She greets me, making and mostly keeping eye contact.  She appears chronically ill and somewhat frail.  She is alert and oriented, able to make her basic needs known.  I introduced Palliative Medicine as specialized medical care for people living with serious illness. It focuses on providing relief from the symptoms and stress of a serious illness. The goal is to improve quality of life for both the patient and the family.  We discussed a brief life review of the patient.  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  She lives next door to her daughter [name] and her family and they have a son [name].  We then focused on their current illness.  [name] tells me that she is here for pneumonia.  Mr. and Mrs. Horace states that they see the pulmonologist  locally here at [facility].  We talk about the treatment plan for continued antibiotics and steroids.  The natural disease trajectory and expectations at EOL were discussed.  Advanced directives, concepts specific to code status, were considered and discussed.  [name] states that she would want attempted resuscitation and intubation, but also states she would not want "heroic measures".  I encouraged her to consider her choices, talking with her family and trusted physicians.   Palliative Care services outpatient were explained and offered.  We talked about the benefits of a nurse practitioner coming once a month to continue goals of care discussions, talking about the "what if's and maybe's".  At this point family is considering outpatient palliative.  Discussed the importance of continued conversation with family and the medical providers regarding overall plan of care and treatment options, ensuring decisions are within the context of the patients values and GOCs.  Questions and concerns were addressed. The family was encouraged to call with questions or concerns.  PMT will continue to support holistically.  Conference with attending, bedside nursing staff, transition of care team related to patient condition, needs, goals of care, disposition.   HCPOA  NEXT OF KIN -[name] names her husband, [name] as her healthcare surrogate.  She tells me that she lives next-door to their daughter, [name].  And their son [name] is available to help as needed.    SUMMARY OF RECOMMENDATIONS   At this point continue full scope/full code  Home with [facility] home health  Considering outpatient palliative services   Code Status/Advance Care Planning: Full code -[name] states that she would want attempted resuscitation and life support but also tells me that she does not want any "extraordinary measures".  We talked about how long she would want be kept on life support, I encouraged her  to consider these choices and talk with her family and trusted doctors.  Symptom Management:  Per hospitalist, no additional needs at this time.  Palliative Prophylaxis:  Frequent Pain Assessment and Oral Care  Additional Recommendations (Limitations, Scope, Preferences): Full Scope Treatment  Psycho-social/Spiritual:  Desire for further Chaplaincy support:no Additional Recommendations: Caregiving  Support/Resources  Prognosis:  Unable to determine, based on outcomes.  6 months or less would not be surprising based on functional status, advanced age, chronic illness burden.  Discharge Planning:  Home with home health, considering outpatient palliative services.       Primary Diagnoses: Present on Admission:  Acute and chronic respiratory failure with hypoxia ([place])  COPD with acute exacerbation (HCC)  Essential hypertension  Rheumatoid arthritis ([place])   I have reviewed the medical record, interviewed the patient and family, and examined the patient. The following aspects are pertinent.  Past Medical History:  Diagnosis Date   Anxiety    Asthma    CHF (congestive heart failure) (HCC)    COPD (chronic obstructive pulmonary disease) (HCC)    Hypertension    Melanoma ([place])    scalp (2007) & leg (2013)   Myocardial infarction ([place]) 09/10/1997   no PCI or CABG   Osteoporosis    Rheumatoid arthritis (HCC)    SVT (supraventricular tachycardia) (HCC)    Social History   Socioeconomic History   Marital status: Married    Spouse name: Not on file   Number of children: Not on file   Years of education: Not on file   Highest education level: Not on file  Occupational History   Not on file  Tobacco Use   Smoking status: Former    Packs/day: 1.50    Years: 22.00    Pack years: 33.00    Types: Cigarettes    Quit date: 04/07/1974    Years since quitting: 47.6   Smokeless tobacco: Never  Substance and Sexual Activity   Alcohol use: No   Drug use: No   Sexual activity:  Never  Other Topics Concern   Not on file  Social History Narrative   Lives in [place].  Originally from [place].  Children live in [place].   Social Determinants of Health   Financial Resource Strain: Not on file  Food Insecurity: Not on file  Transportation Needs: Not on file  Physical Activity: Not on file  Stress: Not on file  Social Connections: Not on file   History reviewed. No pertinent family history. Scheduled Meds:  amiodarone  200 mg Oral BID   aspirin EC  81 mg Oral Daily   citalopram  20 mg Oral Daily   fluticasone furoate-vilanterol  1 puff Inhalation Daily   And   umeclidinium bromide  1 puff Inhalation Daily   heparin  5,000 Units Subcutaneous Q8H   hydrochlorothiazide  12.5 mg  Oral Daily   insulin aspart  0-6 Units Subcutaneous TID WC   lisinopril  40 mg Oral Daily   methylPREDNISolone (SOLU-MEDROL) injection  40 mg Intravenous Q24H   metoprolol tartrate  25 mg Oral BID   montelukast  10 mg Oral QHS   pantoprazole  40 mg Oral Daily   sodium chloride flush  3 mL Intravenous Q12H   sucralfate  1 g Oral QID   Continuous Infusions:  cefTRIAXone (ROCEPHIN)  IV 1 g (11/19/21 1733)   PRN Meds:.acetaminophen, albuterol, melatonin, nitroGLYCERIN Medications Prior to Admission:  Prior to Admission medications   Medication Sig Start Date End Date Taking? Authorizing Provider  acetaminophen (TYLENOL) 325 MG tablet Take 650 mg by mouth every 6 (six) hours as needed.   Yes [provider]  albuterol (PROVENTIL) (2.5 MG/3ML) 0.083% nebulizer solution Inhale 3 mLs into the lungs every 6 (six) hours as needed. 11/07/21  Yes [provider]  Cholecalciferol (VITAMIN D PO) Take 25 mcg by mouth daily.   Yes [provider]  citalopram (CELEXA) 20 MG tablet Take 20 mg by mouth daily.   Yes [provider]  fish oil-omega-3 fatty acids 1000 MG capsule Take 2 g by mouth daily.   Yes [provider]  hydrochlorothiazide  (HYDRODIURIL) 12.5 MG tablet Take 12.5 mg by mouth daily. 10/07/21  Yes [provider]  ipratropium-albuterol (DUONEB) 0.5-2.5 (3) MG/3ML SOLN Take 3 mLs by nebulization every 6 (six) hours as needed.   Yes [provider]  lisinopril (ZESTRIL) 40 MG tablet Take 40 mg by mouth daily. 04/03/21  Yes [provider]  montelukast (SINGULAIR) 10 MG tablet Take 10 mg by mouth at bedtime.   Yes [provider]  omeprazole (PRILOSEC) 20 MG capsule Take 20 mg by mouth 2 (two) times daily before a meal.   Yes [provider]  sucralfate (CARAFATE) 1 g tablet Take 1 g by mouth 4 (four) times daily. 03/07/21  Yes [provider]  traMADol (ULTRAM) 50 MG tablet Take 50 mg by mouth 2 (two) times daily as needed. 10/18/21  Yes [provider]  TRELEGY ELLIPTA 100-62.5-25 MCG/INH AEPB Take 1 puff by mouth daily. 03/13/21  Yes [provider]  acetaminophen-codeine (TYLENOL #3) 300-30 MG tablet Take 1 tablet by mouth every 4 (four) hours as needed. Patient not taking: Reported on 04/20/2021 04/05/21   [provider]  alendronate (FOSAMAX) 70 MG tablet Take 70 mg by mouth once a week. 10/23/21   [provider]  bisoprolol (ZEBETA) 5 MG tablet Take 0.5 tablets (2.5 mg total) by mouth at bedtime. Patient not taking: Reported on 11/14/2021 04/13/21   Loletha Grayer, MD  diltiazem (CARDIZEM CD) 180 MG 24 hr capsule Take 1 capsule (180 mg total) by mouth daily. Patient not taking: Reported on 11/14/2021 04/14/21   Loletha Grayer, MD  furosemide (LASIX) 20 MG tablet Take 1 tablet (20 mg total) by mouth daily. Patient not taking: Reported on 11/14/2021 04/14/21   Loletha Grayer, MD  nitroGLYCERIN (NITROSTAT) 0.4 MG SL tablet Place 1 tablet (0.4 mg total) under the tongue every 5 (five) minutes as needed for chest pain. 02/23/13   Ivor Costa, MD  potassium chloride SA (KLOR-CON) 20 MEQ tablet Take 1 tablet (20 mEq total) by mouth daily. Patient not  taking: Reported on 11/14/2021 04/14/21   Loletha Grayer, MD  tizanidine (ZANAFLEX) 2 MG capsule Take 2 mg by mouth 3 (three) times daily. Patient not taking: Reported on 11/14/2021  [provider]  losartan (COZAAR) 50 MG tablet Take 50 mg by mouth daily. Patient not taking: Reported on 04/11/2021  04/12/21  [provider]  metoprolol succinate (TOPROL-XL) 100 MG 24 hr tablet Take 100 mg by mouth 2 (two) times daily. Take with or immediately following a meal. Patient not taking: Reported on 04/11/2021  04/12/21  [provider]   Allergies  Allergen Reactions   Celebrex [Celecoxib] Other (See Comments)    Heart stops   Morphine And Related     Hallucinations    Review of Systems  Unable to perform ROS: Age   Physical Exam Vitals and nursing note reviewed.  Constitutional:      General: She is not in acute distress.    Appearance: She is well-developed.  HENT:     Head: Normocephalic and atraumatic.     Mouth/Throat:     Mouth: Mucous membranes are moist.  Cardiovascular:     Rate and Rhythm: Normal rate.  Pulmonary:     Effort: No tachypnea.  Musculoskeletal:     Comments: Some joint deformity secondary to RA  Skin:    General: Skin is warm and dry.     Findings: Ecchymosis present.  Neurological:     Mental Status: She is alert and oriented to person, place, and time.  Psychiatric:        Mood and Affect: Mood normal.        Behavior: Behavior normal.    Vital Signs: BP (!) 155/63 (BP Location: Right Arm)    Pulse (!) 55    Temp 97.9 F (36.6 C)    Resp 19    Ht 5' (1.524 m)    Wt 50.8 kg    SpO2 94%    BMI 21.87 kg/m  Pain Scale: 0-10 POSS *See Group Information*: S-Acceptable,Sleep, easy to arouse Pain Score: 0-No pain   SpO2: SpO2: 94 % O2 Device:SpO2: 94 % O2 Flow Rate: .O2 Flow Rate (L/min): 5 L/min  IO: Intake/output summary:  Intake/Output Summary (Last 24 hours) at 11/20/2021 1442 Last data filed at 11/20/2021 0749 Gross per 24  hour  Intake 240 ml  Output 1250 ml  Net -1010 ml    LBM: Last BM Date : 11/19/21 Baseline Weight: Weight: 50.5 kg Most recent weight: Weight: 50.8 kg     Palliative Assessment/Data:   Flowsheet Rows    Flowsheet Row Most Recent Value  Intake Tab   Referral Department Hospitalist  Unit at Time of Referral Other (Comment)  Palliative Care Primary Diagnosis Pulmonary  Date Notified 11/18/21  Palliative Care Type New Palliative care  Reason for referral Clarify Goals of Care  Date of Admission 11/14/21  Date first seen by Palliative Care 11/20/21  # of days Palliative referral response time 2 Day(s)  # of days IP prior to Palliative referral 4  Clinical Assessment   Palliative Performance Scale Score 40%  Pain Max last 24 hours Not able to report  Pain Min Last 24 hours Not able to report  Dyspnea Max Last 24 Hours Not able to report  Dyspnea Min Last 24 hours Not able to report  Psychosocial & Spiritual Assessment   Palliative Care Outcomes        Time In: 1330 Time Out: 1425  Time Total: 55 minutes  Greater than 50%  of this time was spent counseling and coordinating care related to the above assessment and plan.  Signed by: Drue Novel, NP   Please contact Palliative  Medicine Team phone at [phone] for questions and concerns.  For individual provider: See Shea Evans

## 2021-11-21 LAB — GLUCOSE, CAPILLARY
Glucose-Capillary: 79 mg/dL (ref 70–99)
Glucose-Capillary: 111 mg/dL — ABNORMAL HIGH (ref 70–99)
Glucose-Capillary: 209 mg/dL — ABNORMAL HIGH (ref 70–99)

## 2021-11-21 LAB — BASIC METABOLIC PANEL
Anion gap: 7 (ref 5–15)
BUN: 19 mg/dL (ref 8–23)
CO2: 29 mmol/L (ref 22–32)
Calcium: 8.6 mg/dL — ABNORMAL LOW (ref 8.9–10.3)
Chloride: 97 mmol/L — ABNORMAL LOW (ref 98–111)
Creatinine, Ser: 0.8 mg/dL (ref 0.44–1.00)
GFR, Estimated: >60 mL/min (ref >60)
Glucose, Bld: 85 mg/dL (ref 70–99)
Potassium: 3.1 mmol/L — ABNORMAL LOW (ref 3.5–5.1)
Sodium: 133 mmol/L — ABNORMAL LOW (ref 135–145)

## 2021-11-21 LAB — CBC
HCT: 33.8 % — ABNORMAL LOW (ref 36.0–46.0)
Hemoglobin: 10.5 g/dL — ABNORMAL LOW (ref 12.0–15.0)
MCH: 25.7 pg — ABNORMAL LOW (ref 26.0–34.0)
MCHC: 31.1 g/dL (ref 30.0–36.0)
MCV: 82.8 fL (ref 80.0–100.0)
Platelets: 495 10*3/uL — ABNORMAL HIGH (ref 150–400)
RBC: 4.08 MIL/uL (ref 3.87–5.11)
RDW: 14.7 % (ref 11.5–15.5)
WBC: 22.5 10*3/uL — ABNORMAL HIGH (ref 4.0–10.5)
nRBC: 0 % (ref 0.0–0.2)

## 2021-11-21 LAB — MAGNESIUM: Magnesium: 1.6 mg/dL — ABNORMAL LOW (ref 1.7–2.4)

## 2021-11-21 MED ORDER — METOPROLOL TARTRATE 25 MG PO TABS
12.5000 mg | ORAL_TABLET | Freq: Two times a day (BID) | ORAL | Status: DC
  Administered 2021-11-21 – 2021-11-22 (×2): 12.5 mg via ORAL
  Filled 2021-11-21 (×2): qty 1

## 2021-11-21 MED ORDER — MAGNESIUM SULFATE 4 GM/100ML IV SOLN: 4.0000 g | Freq: Once | INTRAVENOUS | Status: DC

## 2021-11-21 MED ORDER — POTASSIUM CHLORIDE CRYS ER 20 MEQ PO TBCR
40.0000 meq | EXTENDED_RELEASE_TABLET | Freq: Once | ORAL | Status: AC
  Administered 2021-11-21: 40 meq via ORAL
  Filled 2021-11-21: qty 2

## 2021-11-21 MED ORDER — MAGNESIUM SULFATE 2 GM/50ML IV SOLN
2.0000 g | Freq: Once | INTRAVENOUS | Status: AC
Start: 2021-11-21 — End: 2021-11-21
  Administered 2021-11-21: 2 g via INTRAVENOUS
  Filled 2021-11-21: qty 50

## 2021-11-21 MED ORDER — ACETYLCYSTEINE 20 % IN SOLN
3.0000 mL | Freq: Three times a day (TID) | RESPIRATORY_TRACT | Status: DC
  Administered 2021-11-22: 3 mL via RESPIRATORY_TRACT
  Filled 2021-11-21: qty 4

## 2021-11-21 MED ORDER — ACETYLCYSTEINE 20 % IN SOLN
3.0000 mL | RESPIRATORY_TRACT | Status: DC
  Administered 2021-11-21 (×4): 3 mL via RESPIRATORY_TRACT
  Filled 2021-11-21 (×7): qty 4

## 2021-11-21 MED ORDER — SODIUM CHLORIDE 0.9 % IV SOLN
INTRAVENOUS | Status: DC | PRN
Start: 2021-11-21 — End: 2021-11-22

## 2021-11-21 NOTE — Progress Notes (Signed)
? ? ? ?PULMONOLOGY ? ? ? ? ? ? ? ? ?Date: 11/21/2021,   ?MRN# [id number] [name] [birth year] ? ? ?  ?AdmissionWeight: 50.5 kg                 ?CurrentWeight: 50.8 kg ? ? ?Referring physician: Dr Algis Liming ? ? ?CHIEF COMPLAINT:  ? ?Acute on chronic hypoxemic respiratory failure ? ? ?HISTORY OF PRESENT ILLNESS  ? ?This is an 88 yo F with hx of chronic lung disease with Asthma COPD overlap syndrome (ACOS) and chronic hypoxemia on 4L/min [place] as well as RA , GERD, HTN. She does have husband at bedside and he is able to help with details of history.  ? ?Patient reports that home RN was able to evaluate her and noted that she was in distress with labored breathing and asked for patient to be seen in ER.  ? ?On admission to ER she required BIPAP 10/5 40% FioO2.  ? ?She had chest imaging done via CXR with no grossly appreciable infiltrate with chronic opacification due to breat implants.  CT angio done Aug 2022 reviewed independently which does show chronic bronchitic changes with crowding of right basal bronchial segments.  No fibrotic changes to suggest RA-ILD.  ? ?Blood work with AKI and hypoalbuminemia with hypokalemia.   ? ?She received IV fluids and after this the CBC did show dilutional effects with anemia and other cell lines reduced in parallel which is expected. Patient denies having hemoptysis but did report loose stools unsure if there is blood in it.  ? ?She does have leukocytosis on arrival. She was placed on heparin due to notable troponin elevation and had cardiology evaluation while in ER.  ? ?11/16/21- patient is improved, I reviewed CT chest findings with her and husband at bedside.  ? ?11/17/21- patient with desaturation on mild exertion. She has consolidated LLL and would benefit from mcuolytic neb therapy as well a more aggressive BPH with metaneb to optimize for dc home. Revewed plan with Attending physician today. Met with daughter to review medical plan  ? ?11/18/21- patient is still feeling bad but  coughing up phlegm.   CXR is minimally improved.  Anemia is progressively worse over past year and she had anal cancer before , query regarding GI loss , she reports multiple stools daily. FOBT ordered twice but not done.  Steroids and lasix ongoing. She is bringing up copious phlegm on expectoration.  ? ? ? ? ? ? ? ? ? ? ? ?11/19/21-patient is stable without overnight events.  Plan to continue zithrmoax and rocephin x 10d and steroids with weaning reduced dose to 21m. ? ?11/20/21- patient feels stamina and energy is improved. She is able to get OOB to bathroom instead of using bedpan. She is now taking tidal volume 750cc on incentive spirometry. She had multiple loose stools with blood grossly notable per patient.  Anemia has worsened over past 774m from Hb 11.8 to 8.   She is currently on asa and heparin [place] which may potentiate GI blood loss.  She reports external hemorroids and hx of rectal Cancer. FOBT negative x1 , will repeat and have discussed medical plan with RN.  ? ?11/21/21- patient shares she feels better.  We discussed prognosis.  BODE index with 4 year survival <10%.  We discussed palliative care evaluation and focus on non suffering. Patient states "I like that".  She worked with PT today and was able to walk around room  ? ?PAST MEDICAL HISTORY  ? ?  Past Medical History:  ?Diagnosis Date  ? Anxiety   ? Asthma   ? CHF (congestive heart failure) ([place])   ? COPD (chronic obstructive pulmonary disease) ([place])   ? Hypertension   ? Melanoma ([place])   ? scalp (2007) & leg (2013)  ? Myocardial infarction [facility]) 09/10/1997  ? no PCI or CABG  ? Osteoporosis   ? Rheumatoid arthritis ([place])   ? SVT (supraventricular tachycardia) ([place])   ? ? ? ?SURGICAL HISTORY  ? ?Past Surgical History:  ?Procedure Laterality Date  ? ABDOMINAL HYSTERECTOMY    ? BREAST SURGERY    ? CARDIAC SURGERY    ? CARPAL TUNNEL RELEASE    ? MELANOMA EXCISION  2007  ? scalp  ? MELANOMA EXCISION Right 2013  ? lower leg  ? SHOULDER SURGERY    ? TOTAL  HIP REVISION  2015  ? VIDEO BRONCHOSCOPY Bilateral 04/08/2014  ? Procedure: VIDEO BRONCHOSCOPY WITHOUT FLUORO;  Surgeon: Juanito Doom, MD;  [facility] ENDOSCOPY;  Service: Cardiopulmonary;  Laterality: Bilateral;  ? ? ? ?FAMILY HISTORY  ? ?History reviewed. No pertinent family history. ? ? ?SOCIAL HISTORY  ? ?Social History  ? ?Tobacco Use  ? Smoking status: Former  ?  Packs/day: 1.50  ?  Years: 22.00  ?  Pack years: 33.00  ?  Types: Cigarettes  ?  Quit date: 04/07/1974  ?  Years since quitting: 47.6  ? Smokeless tobacco: Never  ?Substance Use Topics  ? Alcohol use: No  ? Drug use: No  ? ? ? ?MEDICATIONS  ? ? ?Home Medication:  ? ?  ?Current Medication: ? ?Current Facility-Administered Medications:  ?  acetaminophen (TYLENOL) tablet 650 mg, 650 mg, Oral, Q6H PRN, Cristela Felt, Kathe Becton, MD, 650 mg at 11/19/21 0112 ?  acetylcysteine (MUCOMYST) 20 % nebulizer / oral solution 3 mL, 3 mL, Nebulization, Q4H, Valrie Jia, MD ?  acidophilus (RISAQUAD) capsule 2 capsule, 2 capsule, Oral, TID, Ottie Glazier, MD, 2 capsule at 11/20/21 2126 ?  albuterol (PROVENTIL) (2.5 MG/3ML) 0.083% nebulizer solution 2.5 mg, 2.5 mg, Nebulization, Q2H PRN, Ottie Glazier, MD ?  amiodarone (PACERONE) tablet 200 mg, 200 mg, Oral, BID, Tang, Alanson Puls, PA-C, 200 mg at 11/20/21 2125 ?  aspirin EC tablet 81 mg, 81 mg, Oral, Daily, Tristan Schroeder, PA-C, 81 mg at 11/20/21 0941 ?  citalopram (CELEXA) tablet 20 mg, 20 mg, Oral, Daily, Benita Gutter, RPH, 20 mg at 11/20/21 0941 ?  fluticasone furoate-vilanterol (BREO ELLIPTA) 100-25 MCG/ACT 1 puff, 1 puff, Inhalation, Daily, 1 puff at 11/20/21 0941 **AND** umeclidinium bromide (INCRUSE ELLIPTA) 62.5 MCG/ACT 1 puff, 1 puff, Inhalation, Daily, [place], Kathe Becton, MD, 1 puff at 11/20/21 0941 ?  heparin injection 5,000 Units, 5,000 Units, Subcutaneous, Q8H, Kristopher Oppenheim, DO, 5,000 Units at 11/21/21 0530 ?  hydrochlorothiazide (HYDRODIURIL) tablet 12.5 mg, 12.5 mg, Oral, Daily, Andrez Grime, MD, 12.5 mg at 11/20/21 0941 ?  insulin aspart (novoLOG) injection 0-6 Units, 0-6 Units, Subcutaneous, TID WC, [place]algi, Lenis Dickinson, MD, 1 Units at 11/20/21 1710 ?  lisinopril (ZESTRIL) tablet 40 mg, 40 mg, Oral, Daily, Tang, Alanson Puls, PA-C, 40 mg at 11/20/21 0940 ?  melatonin tablet 5 mg, 5 mg, Oral, QHS PRN, Sharion Settler, NP, 5 mg at 11/20/21 2133 ?  metoprolol tartrate (LOPRESSOR) tablet 25 mg, 25 mg, Oral, BID, Hongalgi, Anand D, MD, 25 mg at 11/20/21 2125 ?  montelukast (SINGULAIR) tablet 10 mg, 10 mg, Oral, QHS, [place], Kathe Becton, MD, 10 mg  at 11/20/21 2125 ?  nitroGLYCERIN (NITROSTAT) SL tablet 0.4 mg, 0.4 mg, Sublingual, Q5 min PRN, [place], Kathe Becton, MD ?  pantoprazole (PROTONIX) EC tablet 40 mg, 40 mg, Oral, Daily, Benita Gutter, RPH, 40 mg at 11/20/21 0940 ?  potassium chloride SA (KLOR-CON M) CR tablet 40 mEq, 40 mEq, Oral, Once, Tang, Alanson Puls, PA-C ?  predniSONE (DELTASONE) tablet 50 mg, 50 mg, Oral, Q breakfast, Clemente Dewey, MD ?  sodium chloride flush (NS) 0.9 % injection 3 mL, 3 mL, Intravenous, Q12H, Hongalgi, Anand D, MD, 3 mL at 11/20/21 2127 ?  sucralfate (CARAFATE) tablet 1 g, 1 g, Oral, QID, [place], Kathe Becton, MD, 1 g at 11/20/21 2126 ? ? ? ?ALLERGIES  ? ?Celebrex [celecoxib] and Morphine and related ? ? ? ? ?REVIEW OF SYSTEMS  ? ? ?Review of Systems: ? ?Gen:  Denies  fever, sweats, chills weigh loss  ?HEENT: Denies blurred vision, double vision, ear pain, eye pain, hearing loss, nose bleeds, sore throat ?Cardiac:  No dizziness, chest pain or heaviness, chest tightness,edema ?Resp:   Denies cough or sputum porduction, shortness of breath,wheezing, hemoptysis,  ?Gi: Denies swallowing difficulty, stomach pain, nausea or vomiting, diarrhea, constipation, bowel incontinence ?Gu:  Denies bladder incontinence, burning urine ?Ext:   Denies Joint pain, stiffness or swelling ?Skin: Denies  skin rash, easy bruising or bleeding or hives ?Endoc:  Denies polyuria,  polydipsia , polyphagia or weight change ?Psych:   Denies depression, insomnia or hallucinations  ? ?Other:  All other systems negative ? ? ?VS: BP (!) 156/63 (BP Location: Right Arm)   Pulse (!) 57   Temp 98.2 ?F

## 2021-11-21 NOTE — Progress Notes (Signed)
Physical Therapy Treatment ?Patient Details ?Name: [name] ?MRN: [id number] ?DOB: 09/20/1932 ?Today's Date: 11/21/2021 ? ? ?History of Present Illness 88 year old female with medical history significant for CAD, HTN, COPD, chronic respiratory failure with hypoxia on 4 L/min nasal cannula oxygen, rheumatoid arthritis, osteoporosis and GERD presented to the ED with complaints of dyspnea.  She reportedly saw her MD for left hip pain and was diagnosed with a "crack" in the hip bone and prescribed some meds.  Following taking the meds prescribed, she started experiencing worsening dyspnea.  This was associated with productive cough without fever or chest pain.  Home DuoNebs did not offer relief.  Admitted for acute on chronic respiratory failure with hypoxia due to suspected COPD exacerbation, community-acquired pneumonia. ? ?  ?PT Comments  ? ? Pt was pleasant and motivated to participate during the session and put forth good effort throughout. Pt required min assist with bed mobility tasks but was able to come to standing from multiple height surfaces during transfer training without physical assist.  Pt was able to amb a max of 10 feet before needing to return to sitting secondary to fatigue with SpO2 dropping from the low 90s on 6LO2/min to a low of 86%. SpO2 returned to 90% after sitting x 30-45 sec with cues for PLB.  Pt only able to amb a max of 5 feet during her second walk after a seated therapeutic rest break.  Pt left on 6LO2/min at end of session, nursing notified. Pt will benefit from PT services in a SNF setting upon discharge to safely address deficits listed in patient problem list for decreased caregiver assistance and eventual return to PLOF. ? ?   ?Recommendations for follow up therapy are one component of a multi-disciplinary discharge planning process, led by the attending physician.  Recommendations may be updated based on patient status, additional functional criteria and insurance  authorization. ? ?Follow Up Recommendations ? Skilled nursing-short term rehab (<3 hours/day) ?  ?  ?Assistance Recommended at Discharge Intermittent Supervision/Assistance  ?Patient can return home with the following A little help with walking and/or transfers;A little help with bathing/dressing/bathroom;Assistance with cooking/housework;Assist for transportation;Help with stairs or ramp for entrance ?  ?Equipment Recommendations ? None recommended by PT  ?  ?Recommendations for Other Services   ? ? ?  ?Precautions / Restrictions Precautions ?Precautions: Fall ?Precaution Comments: monitor O2 sats ?Restrictions ?Weight Bearing Restrictions: No  ?  ? ?Mobility ? Bed Mobility ?  ?  ?  ?  ?Supine to sit: Min assist ?  ?  ?General bed mobility comments: Min A for BLE and trunk control ?  ? ?Transfers ?Overall transfer level: Needs assistance ?Equipment used: Rolling walker (2 wheels) ?Transfers: Sit to/from Stand ?Sit to Stand: Min guard ?  ?  ?  ?  ?  ?General transfer comment: Fair to good eccentric and concentric control and stability ?  ? ?Ambulation/Gait ?Ambulation/Gait assistance: Min guard ?Gait Distance (Feet): 10 Feet x 1, 5 Feet x 1 ?Assistive device: Rolling walker (2 wheels) ?Gait Pattern/deviations: Step-through pattern, Decreased step length - right, Decreased step length - left ?Gait velocity: decreased ?  ?  ?General Gait Details: Very short B step length with slow cadence but steady without LOB ? ? ?Stairs ?  ?  ?  ?  ?  ? ? ?Wheelchair Mobility ?  ? ?Modified Rankin (Stroke Patients Only) ?  ? ? ?  ?Balance Overall balance assessment: Needs assistance ?  ?Sitting balance-Leahy Scale: Good ?  ?  ?  Standing balance support: Bilateral upper extremity supported, During functional activity ?Standing balance-Leahy Scale: Fair ?  ?  ?  ?  ?  ?  ?  ?  ?  ?  ?  ?  ?  ? ?  ?Cognition Arousal/Alertness: Awake/alert ?Behavior During Therapy: [facility] for tasks assessed/performed ?Overall Cognitive Status: Within  Functional Limits for tasks assessed ?  ?  ?  ?  ?  ?  ?  ?  ?  ?  ?  ?  ?  ?  ?  ?  ?  ?  ?  ? ?  ?Exercises   ? ?  ?General Comments   ?  ?  ? ?Pertinent Vitals/Pain Pain Assessment ?Pain Assessment: No/denies pain  ? ? ?Home Living   ?  ?  ?  ?  ?  ?  ?  ?  ?  ?   ?  ?Prior Function    ?  ?  ?   ? ?PT Goals (current goals can now be found in the care plan section) Progress towards PT goals: Progressing toward goals ? ?  ?Frequency ? ? ? Min 2X/week ? ? ? ?  ?PT Plan Current plan remains appropriate  ? ? ?Co-evaluation   ?  ?  ?  ?  ? ?  ?AM-PAC PT "6 Clicks" Mobility   ?Outcome Measure ? Help needed turning from your back to your side while in a flat bed without using bedrails?: A Little ?Help needed moving from lying on your back to sitting on the side of a flat bed without using bedrails?: A Little ?Help needed moving to and from a bed to a chair (including a wheelchair)?: A Little ?Help needed standing up from a chair using your arms (e.g., wheelchair or bedside chair)?: A Little ?Help needed to walk in hospital room?: A Lot ?Help needed climbing 3-5 steps with a railing? : Total ?6 Click Score: 15 ? ?  ?End of Session Equipment Utilized During Treatment: Oxygen;Gait belt ?Activity Tolerance: Patient tolerated treatment well ?Patient left: in chair;with call bell/phone within reach;with chair alarm set ?Nurse Communication: Mobility status ?PT Visit Diagnosis: Muscle weakness (generalized) (M62.81);Difficulty in walking, not elsewhere classified (R26.2) ?  ? ? ?Time: 4193-7902 ?PT Time Calculation (min) (ACUTE ONLY): 28 min ? ?Charges:  $Gait Training: 8-22 mins ?$Therapeutic Activity: 8-22 mins          ?          ? ?D. Royetta Asal PT, DPT ?11/21/21, 3:44 PM ? ? ?

## 2021-11-21 NOTE — Progress Notes (Signed)
?[place] CARDIOLOGY CONSULT NOTE  ? ?    ?Patient ID: ?[name] ?MRN: [id number] ?DOB/AGE: 86-Nov-1934 86 y.o. ? ?Admit date: 11/14/2021 ?Referring Physician Dr. Algis Liming ?Primary Physician Lloyd Huger  ?Primary Cardiologist in [place] (last seen 1 year ago)  ?Reason for Consultation atypical chest pain  ? ?HPI: The patient is an 88 year old female with a past medical history notable for HFpEF (LVEF 55-60% with myxomatous MV, mild AR with g3DD), history of MI 1999, COPD on 4 L at baseline, hypertension, rheumatoid arthritis who presented to [facility] ED with shortness of breath and productive cough for 2 to 3 days.  She presented hypoxic to the 70s and severely dyspneic requiring BiPAP initially, discovered to have pneumonia.  Cardiology was consulted because of her elevated troponin and chest pain. ? ?Interval History: ?-Admits to productive cough with copious sputum, does not feel any better today. ?-Has reproducible left-sided chest wall/rib pain worse with coughing ?-Bradycardia down to the 40s overnight ?-Denies further palpitations. Still on 5 L (baseline 4) ? ?Review of systems complete and found to be negative unless listed above  ? ? ?Past Medical History:  ?Diagnosis Date  ? Anxiety   ? Asthma   ? CHF (congestive heart failure) ([place])   ? COPD (chronic obstructive pulmonary disease) ([place])   ? Hypertension   ? Melanoma ([place])   ? scalp (2007) & leg (2013)  ? Myocardial infarction [facility]) 09/10/1997  ? no PCI or CABG  ? Osteoporosis   ? Rheumatoid arthritis ([place])   ? SVT (supraventricular tachycardia) ([place])   ?  ?Past Surgical History:  ?Procedure Laterality Date  ? ABDOMINAL HYSTERECTOMY    ? BREAST SURGERY    ? CARDIAC SURGERY    ? CARPAL TUNNEL RELEASE    ? MELANOMA EXCISION  2007  ? scalp  ? MELANOMA EXCISION Right 2013  ? lower leg  ? SHOULDER SURGERY    ? TOTAL HIP REVISION  2015  ? VIDEO BRONCHOSCOPY Bilateral 04/08/2014  ? Procedure: VIDEO BRONCHOSCOPY WITHOUT FLUORO;  Surgeon: Juanito Doom, MD;  Location: [facility] ENDOSCOPY;  Service: Cardiopulmonary;  Laterality: Bilateral;  ?  ?Medications Prior to Admission  ?Medication Sig Dispense Refill Last Dose  ? acetaminophen (TYLENOL) 325 MG tablet Take 650 mg by mouth every 6 (six) hours as needed.   11/14/2021  ? albuterol (PROVENTIL) (2.5 MG/3ML) 0.083% nebulizer solution Inhale 3 mLs into the lungs every 6 (six) hours as needed.   11/14/2021  ? Cholecalciferol (VITAMIN D PO) Take 25 mcg by mouth daily.   11/14/2021  ? citalopram (CELEXA) 20 MG tablet Take 20 mg by mouth daily.   11/14/2021  ? fish oil-omega-3 fatty acids 1000 MG capsule Take 2 g by mouth daily.   11/14/2021  ? hydrochlorothiazide (HYDRODIURIL) 12.5 MG tablet Take 12.5 mg by mouth daily.   11/14/2021  ? ipratropium-albuterol (DUONEB) 0.5-2.5 (3) MG/3ML SOLN Take 3 mLs by nebulization every 6 (six) hours as needed.   11/13/2021  ? lisinopril (ZESTRIL) 40 MG tablet Take 40 mg by mouth daily.   11/14/2021  ? montelukast (SINGULAIR) 10 MG tablet Take 10 mg by mouth at bedtime.   11/13/2021  ? omeprazole (PRILOSEC) 20 MG capsule Take 20 mg by mouth 2 (two) times daily before a meal.   11/14/2021  ? sucralfate (CARAFATE) 1 g tablet Take 1 g by mouth 4 (four) times daily.   11/13/2021  ? traMADol (ULTRAM) 50 MG tablet Take 50 mg by mouth 2 (two) times  daily as needed.   11/13/2021  ? TRELEGY ELLIPTA 100-62.5-25 MCG/INH AEPB Take 1 puff by mouth daily.   11/14/2021  ? acetaminophen-codeine (TYLENOL #3) 300-30 MG tablet Take 1 tablet by mouth every 4 (four) hours as needed. (Patient not taking: Reported on 04/20/2021)     ? alendronate (FOSAMAX) 70 MG tablet Take 70 mg by mouth once a week.   11/10/2021  ? bisoprolol (ZEBETA) 5 MG tablet Take 0.5 tablets (2.5 mg total) by mouth at bedtime. (Patient not taking: Reported on 11/14/2021) 30 tablet 0 Not Taking  ? diltiazem (CARDIZEM CD) 180 MG 24 hr capsule Take 1 capsule (180 mg total) by mouth daily. (Patient not taking: Reported on 11/14/2021) 30 capsule 0 Not Taking  ?  furosemide (LASIX) 20 MG tablet Take 1 tablet (20 mg total) by mouth daily. (Patient not taking: Reported on 11/14/2021) 30 tablet 0 Not Taking  ? nitroGLYCERIN (NITROSTAT) 0.4 MG SL tablet Place 1 tablet (0.4 mg total) under the tongue every 5 (five) minutes as needed for chest pain. 30 tablet 3 prn  ? potassium chloride SA (KLOR-CON) 20 MEQ tablet Take 1 tablet (20 mEq total) by mouth daily. (Patient not taking: Reported on 11/14/2021) 30 tablet 0 Not Taking  ? tizanidine (ZANAFLEX) 2 MG capsule Take 2 mg by mouth 3 (three) times daily. (Patient not taking: Reported on 11/14/2021)   Not Taking  ? ? ?Social History  ? ?Socioeconomic History  ? Marital status: Married  ?  Spouse name: Not on file  ? Number of children: Not on file  ? Years of education: Not on file  ? Highest education level: Not on file  ?Occupational History  ? Not on file  ?Tobacco Use  ? Smoking status: Former  ?  Packs/day: 1.50  ?  Years: 22.00  ?  Pack years: 33.00  ?  Types: Cigarettes  ?  Quit date: 04/07/1974  ?  Years since quitting: 47.6  ? Smokeless tobacco: Never  ?Substance and Sexual Activity  ? Alcohol use: No  ? Drug use: No  ? Sexual activity: Never  ?Other Topics Concern  ? Not on file  ?Social History Narrative  ? Lives in [place].  Originally from [place].  Children live in [place].  ? ?Social Determinants of Health  ? ?Financial Resource Strain: Not on file  ?Food Insecurity: Not on file  ?Transportation Needs: Not on file  ?Physical Activity: Not on file  ?Stress: Not on file  ?Social Connections: Not on file  ?Intimate Partner Violence: Not on file  ?  ?History reviewed. No pertinent family history.  ? ? ?Review of systems complete and found to be negative unless listed above  ? ? ?PHYSICAL EXAM ?General: Elderly appearing Caucasian female, well nourished, in no acute distress.  Lying flat in PCU bed, husband at bedside ?HEENT:  Normocephalic and atraumatic. ?Neck:  No JVD.  ?Lungs: Normal work of breathing on O2 by nasal  cannula.  Poor air movement, without crackles, wheezes ?Heart: HRRR . Normal S1 and S2.  3/6 systolic murmur heard at the RUSB and apex  ?Chest: Central chest wall with tenderness to palpation ?Abdomen: Non-distended appearing.  ?Msk: Normal strength and tone for age. ?Extremities: Chronic skin discoloration in bilateral lower extremities.  No edema. ?Neuro: Alert and oriented X 3. ?Psych:  Answers questions appropriately.  ? ?Labs: ?  ?Lab Results  ?Component Value Date  ? WBC 22.5 (H) 11/21/2021  ? HGB 10.5 (L) 11/21/2021  ? HCT 33.8 (  L) 11/21/2021  ? MCV 82.8 11/21/2021  ? PLT 495 (H) 11/21/2021  ?  ?Recent Labs  ?Lab 11/15/21 ?0418 11/16/21 ?0440 11/21/21 ?5003  ?NA 135   < > 133*  ?K 3.2*   < > 3.1*  ?CL 100   < > 97*  ?CO2 27   < > 29  ?BUN 45*   < > 19  ?CREATININE 1.02*   < > 0.80  ?CALCIUM 8.2*   < > 8.6*  ?PROT 5.9*  --   --   ?BILITOT 0.3  --   --   ?ALKPHOS 54  --   --   ?ALT 13  --   --   ?AST 23  --   --   ?GLUCOSE 174*   < > 85  ? < > = values in this interval not displayed.  ? ? ?Lab Results  ?Component Value Date  ? TROPONINI <0.30 02/23/2013  ? ? No results found for: CHOL ?No results found for: HDL ?No results found for: [place] ?No results found for: TRIG ?No results found for: CHOLHDL ?No results found for: LDLDIRECT  ?  ?Radiology: CT Angio Chest Pulmonary Embolism (PE) W or WO Contrast ? ?Result Date: 11/15/2021 ?CLINICAL DATA:  Chronic respiratory failure EXAM: CT ANGIOGRAPHY CHEST WITH CONTRAST TECHNIQUE: Multidetector CT imaging of the chest was performed using the standard protocol during bolus administration of intravenous contrast. Multiplanar CT image reconstructions and MIPs were obtained to evaluate the vascular anatomy. RADIATION DOSE REDUCTION: This exam was performed according to the departmental dose-optimization program which includes automated exposure control, adjustment of the mA and/or kV according to patient size and/or use of iterative reconstruction technique. CONTRAST:   61m OMNIPAQUE IOHEXOL 300 MG/ML  SOLN COMPARISON:  04/11/2021 FINDINGS: Cardiovascular: Atherosclerotic calcifications of the thoracic aorta are noted. No aneurysmal dilatation or dissection is noted. Mild cardiac enlar

## 2021-11-21 NOTE — Progress Notes (Addendum)
?PROGRESS NOTE ?  ?DAVEN PINCKNEY  PPI:[id number]    DOB: Sep 07, 1933    DOA: 11/14/2021 ? ?PCP: [name], MD  ? ?I have briefly reviewed patients previous medical records in [facility]. ? ?Chief Complaint  ?Patient presents with  ? Shortness of Breath  ? ? ?Hospital Course:  ?88 year old female with medical history significant for CAD, HTN, COPD, chronic respiratory failure with hypoxia on 4 L/min nasal cannula oxygen, rheumatoid arthritis, osteoporosis and GERD presented to the ED with complaints of dyspnea.  She reportedly saw her MD for left hip pain and was diagnosed with a crack in the hip bone and prescribed some meds.  Following taking the meds prescribed, she started experiencing worsening dyspnea.  This was associated with productive cough without fever or chest pain.  Home DuoNebs did not offer relief.  She was directed to the ED.  Admitted for acute on chronic respiratory failure with hypoxia due to suspected COPD exacerbation, community-acquired pneumonia.  Needed BiPAP overnight of admission. Patient and spouse declined SNF recommended by therapies and insisted on going home. Persistent SVT/AT on 3/11, cardiology following.  Continues to have significant dyspnea and hypoxemia with minimal activity, pulmonology following, palliative care has seen once but will request follow-up.  Not really progressing. ? ? ?Assessment & Plan:  ?Principal Problem: ?  Acute and chronic respiratory failure with hypoxia (HCC) ?Active Problems: ?  COPD with acute exacerbation ([place]) ?  CAP (community acquired pneumonia) ?  PSVT (paroxysmal supraventricular tachycardia) ([place]) ?  Chronic diastolic CHF (congestive heart failure) ([place]) ?  Elevated troponin ?  AKI (acute kidney injury) ([place]) ?  Essential hypertension ?  Left hip pain ?  Failure to thrive in adult ?  Rheumatoid arthritis ([place]) ?  Normocytic anemia ?  Hypokalemia ? ? ?Assessment and Plan: ?* Acute and chronic respiratory failure with hypoxia  (HCC) ?Suspected due to advanced COPD with exacerbation and pneumonia ?Needed BiPAP overnight of admission ?Pulmonology following ?RVP panel negative.  ESR 73 and CRP 27.7 significantly elevated. ?Rheumatoid factor 91.8?  Significance in the context of known RA.  Serum Fungitell is pending ?BNP 826 but down from 1878, 7 months prior.  S/p dose of IV Lasix couple days ago ?Procalcitonin 27.29 > 13.73, already on antibiotics. ?Due to elevated D-dimer and concern for PE, obtained CTA chest, negative for PE. ?As per pulmonology, on nebulized Mucomyst 3 times daily followed by MetaNeb with saline to address left base consolidation.  Repeat chest x-ray without significant change ?Not really making significant progress.  With PT, ambulated a max of 10 feet with oxygen saturation dropping to a low of 86% on 6 L/min oxygen.  Subsequently with mobility tech, ambulated 13 feet and oxygen saturation dropped to 80%. ?Discussed in detail with Dr. Lanney Gins, increase palliative care involvement is appropriate and indicates bode index with 4-year survival <10%. ? ?COPD with acute exacerbation ([place]) ?Completed 7-day course of IV antibiotics including azithromycin and ceftriaxone. ?Treated initially with high-dose IV Solu-Medrol which was gradually tapered and eventually switched to oral prednisone. ?No clinical bronchospasm. ? ?CAP (community acquired pneumonia) ?CTA chest 3/8 confirmed new left lower lobe infiltrate with associated small effusion. ?Completed 7-day course of antibiotics including azithromycin and IV ceftriaxone. ? ?PSVT (paroxysmal supraventricular tachycardia) ([place]) ?Atrial tachycardia ?Cardiology following and assisting with management ?Despite metoprolol, had sustained SVT on 3/11. ?Therefore amiodarone initiated.  Cardiology has her on amiodarone 200 Mg twice daily. ?Metoprolol was reduced to 12.5 twice daily due to bradycardia in  the 40s. ? ?Chronic diastolic CHF (congestive heart failure) ([place]) ?Her persistent  dyspnea despite treatment for pneumonia and COPD. ?S/p IV Lasix 40 mg x 1 with improvement. ?Now resumed home HCTZ 12.5 Mg daily. ? ?Elevated troponin ?Chest pain ?CAD ?Demand ischemia ?Chronic diastolic CHF ?PSVT ?Cardiology input appreciated and suspect demand ischemia in the context of underlying acute respiratory failure and infectious etiology. ?Clinically euvolemic but given degree of DOE and hypoxia, trial of IV Lasix 40 mg x 1. ?Musculoskeletal and reproducible chest pain has resolved ?2D echo: LVEF 55-60%, no regional wall motion abnormalities and grade 1 diastolic dysfunction. ? ?AKI (acute kidney injury) ([place]) ?Presented with creatinine of 1.5.  Resolved. ? ?Essential hypertension ?Now on metoprolol 25 Mg twice daily ?Lisinopril increased to prior home dose of 40 Mg daily. ?Resumed HCTZ 12.5 Mg daily. ? ?Left hip pain ?Patient evaluated as outpatient and told to have some chronic, exact details not available. ?Has not complained of pain here. ?Outpatient follow-up. ? ?Failure to thrive in adult ?Multifactorial due to very advanced age, frail physical health and multiple severe significant comorbidities. ?Palliative medicine input appreciated.  Full code.  Full scope treatment. ?Palliative care medicine to follow-up and include daughter in discussions. ? ?Hypokalemia ?Hypomagnesemia ?Potassium 3.1, magnesium 1.6 ?Replace aggressively and follow in a.m. ? ?Normocytic anemia ?Hemoglobin has dropped from 10 to 8 g in the absence of overt bleeding. ?Hemoglobin up from the 8 g range to 10.5 on 3/14. ? ?Rheumatoid arthritis ([place]) ?Reportedly in remission. ?Rheumatoid factor elevated, unclear significance.  Also has elevated ESR CRP. ? ? ?Body mass index is 21.87 kg/m?. ? ? ? ? ?DVT prophylaxis: heparin injection 5,000 Units Start: 11/16/21 0600 ?SCDs Start: 11/15/21 2130 ?SCDs Start: 11/14/21 2031   ?  Code Status: Full Code:  ?Family Communication: Spouse at bedside. ?Disposition:  ?Status is:  Inpatient ?Remains inpatient appropriate because: Severity of illness, IV meds ?  ? ?Consultants:   ?Pulmonology ?Cardiology ? ?Procedures:   ? ? ?Antimicrobials:   ?As above ? ? ?Subjective:  ?States that she has left-sided chest pain on coughing, started last night.  Productive cough.  Breathing so-so. ? ?Objective:  ? ?Vitals:  ? 11/21/21 0820 11/21/21 0840 11/21/21 1136 11/21/21 1510  ?BP:  (!) 165/60 (!) 152/60   ?Pulse:  (!) 59 (!) 58   ?Resp:  19 (!) 21   ?Temp:  98.5 ?F (36.9 ?C) 98 ?F (36.7 ?C)   ?TempSrc:      ?SpO2: 95% 95% 95% 95%  ?Weight:      ?Height:      ? ? ?General exam: Elderly female, small built and frail, chronically ill looking, sitting up at edge of bed brushing her teeth and looked dyspneic. ?Respiratory system: Distant breath sounds, few left basal crackles but otherwise clear to auscultation.  Mild DOE even while speaking. ?Cardiovascular system: S1 & S2 heard, RRR. No JVD, murmurs, rubs, gallops or clicks. No pedal edema.  Telemetry personally reviewed: Sinus bradycardia in the 50s-40s. ?Gastrointestinal system: Abdomen is nondistended, soft and nontender. No organomegaly or masses felt. Normal bowel sounds heard. ?Central nervous system: Alert and oriented. No focal neurological deficits. ?Extremities: Symmetric 5 x 5 power. ?Skin: No rashes, lesions or ulcers ?Psychiatry: Judgement and insight appear impaired. Mood & affect appropriate.  ? ? ? ?Data Reviewed:   ?I have personally reviewed following labs and imaging studies ? ? ?CBC: ?Recent Labs  ?Lab 11/17/21 ?1008 11/19/21 ?0420 11/21/21 ?1103  ?WBC 9.8 12.5* 22.5*  ?HGB 9.3*  8.4* 10.5*  ?HCT 29.9* 26.9* 33.8*  ?MCV 83.5 82.3 82.8  ?PLT 365 357 495*  ? ? ?Basic Metabolic Panel: ?Recent Labs  ?Lab 11/15/21 ?0418 11/16/21 ?0440 11/17/21 ?1008 11/18/21 ?4935 11/19/21 ?5217 11/21/21 ?4715  ?NA 135 140 137 139 135 133*  ?K 3.2* 3.7 3.1* 4.2 3.6 3.1*  ?CL 100 107 104 105 100 97*  ?CO2 27 27 26 29 29 29   ?GLUCOSE 174* 105* 118* 108* 101* 85   ?BUN 45* 34* 35* 34* 32* 19  ?CREATININE 1.02* 0.83 0.82 0.82 0.92 0.80  ?CALCIUM 8.2* 8.5* 8.6* 8.8* 8.5* 8.6*  ?MG 2.0  --   --  2.2  --  1.6*  ? ? ?Liver Function Tests: ?Recent Labs  ?Lab 11/15/21 ?0418  ?AST 23  ?

## 2021-11-21 NOTE — Progress Notes (Signed)
Mobility Specialist - Progress Note ? ? 11/21/21 1600  ?Mobility  ?Activity Ambulated with assistance in room  ?Level of Assistance Standby assist, set-up cues, supervision of patient - no hands on  ?Assistive Device Front wheel walker  ?Distance Ambulated (ft) 13 ft  ?Activity Response Tolerated well  ?$Mobility charge 1 Mobility  ? ? ?Pre-mobility: 56 HR, 94% SpO2 ?During mobility: 67 HR, 80% SpO2 ?Post-mobility: 63 HR, 92% SpO2 ? ? ?Pt lying in bed upon arrival, utilizing 5L. Pt bumped up to 6L for OOB activity. Pt ambulated in room with supervision, fatigues quickly. Several short bouts of rest taken. Education and demonstration on side-stepping in tight spaces. Pt denied SOB while ambulating, but O2 does desat to 80% once returned EOB. PLB to achieve sats > 88%. Pt left in bed with alarm set, family at bedside.  ? ? ?Kathee Delton ?Mobility Specialist ?11/21/21, 4:04 PM ? ?

## 2021-11-22 LAB — BASIC METABOLIC PANEL
Anion gap: 5 (ref 5–15)
BUN: 16 mg/dL (ref 8–23)
CO2: 29 mmol/L (ref 22–32)
Calcium: 8.4 mg/dL — ABNORMAL LOW (ref 8.9–10.3)
Chloride: 99 mmol/L (ref 98–111)
Creatinine, Ser: 0.83 mg/dL (ref 0.44–1.00)
GFR, Estimated: >60 mL/min (ref >60)
Glucose, Bld: 103 mg/dL — ABNORMAL HIGH (ref 70–99)
Potassium: 3.8 mmol/L (ref 3.5–5.1)
Sodium: 133 mmol/L — ABNORMAL LOW (ref 135–145)

## 2021-11-22 LAB — GLUCOSE, CAPILLARY: Glucose-Capillary: 86 mg/dL (ref 70–99)

## 2021-11-22 LAB — MAGNESIUM: Magnesium: 2 mg/dL (ref 1.7–2.4)

## 2021-11-22 MED ORDER — ASPIRIN 81 MG PO TBEC: 81.0000 mg | DELAYED_RELEASE_TABLET | Freq: Every day | ORAL | 11 refills | Status: AC

## 2021-11-22 MED ORDER — PREDNISONE 20 MG PO TABS: ORAL_TABLET | ORAL | 0 refills | Status: AC

## 2021-11-22 MED ORDER — AMIODARONE HCL 200 MG PO TABS: ORAL_TABLET | ORAL | 2 refills | Status: AC

## 2021-11-22 MED ORDER — METOPROLOL TARTRATE 25 MG PO TABS: 12.5000 mg | ORAL_TABLET | Freq: Two times a day (BID) | ORAL | 2 refills | Status: AC

## 2021-11-22 NOTE — Progress Notes (Signed)
? ? ? ?PULMONOLOGY ? ? ? ? ? ? ? ? ?Date: 11/22/2021,   ?MRN# [id number] [name] Nov 23, 1932 ? ? ?  ?AdmissionWeight: 50.5 kg                 ?CurrentWeight: 50.8 kg ? ? ?Referring physician: Dr Algis Liming ? ? ?CHIEF COMPLAINT:  ? ?Acute on chronic hypoxemic respiratory failure ? ? ?HISTORY OF PRESENT ILLNESS  ? ?This is an 88 yo F with hx of chronic lung disease with Asthma COPD overlap syndrome (ACOS) and chronic hypoxemia on 4L/min [place] as well as RA , GERD, HTN. She does have husband at bedside and he is able to help with details of history.  ? ?Patient reports that home RN was able to evaluate her and noted that she was in distress with labored breathing and asked for patient to be seen in ER.  ? ?On admission to ER she required BIPAP 10/5 40% FioO2.  ? ?She had chest imaging done via CXR with no grossly appreciable infiltrate with chronic opacification due to breat implants.  CT angio done Aug 2022 reviewed independently which does show chronic bronchitic changes with crowding of right basal bronchial segments.  No fibrotic changes to suggest RA-ILD.  ? ?Blood work with AKI and hypoalbuminemia with hypokalemia.   ? ?She received IV fluids and after this the CBC did show dilutional effects with anemia and other cell lines reduced in parallel which is expected. Patient denies having hemoptysis but did report loose stools unsure if there is blood in it.  ? ?She does have leukocytosis on arrival. She was placed on heparin due to notable troponin elevation and had cardiology evaluation while in ER.  ? ?11/16/21- patient is improved, I reviewed CT chest findings with her and husband at bedside.  ? ?11/17/21- patient with desaturation on mild exertion. She has consolidated LLL and would benefit from mcuolytic neb therapy as well a more aggressive BPH with metaneb to optimize for dc home. Revewed plan with Attending physician today. Met with daughter to review medical plan  ? ?11/18/21- patient is still feeling bad but  coughing up phlegm.   CXR is minimally improved.  Anemia is progressively worse over past year and she had anal cancer before , query regarding GI loss , she reports multiple stools daily. FOBT ordered twice but not done.  Steroids and lasix ongoing. She is bringing up copious phlegm on expectoration.  ? ? ? ? ? ? ? ? ? ? ? ?11/19/21-patient is stable without overnight events.  Plan to continue zithrmoax and rocephin x 10d and steroids with weaning reduced dose to 16m. ? ?11/20/21- patient feels stamina and energy is improved. She is able to get OOB to bathroom instead of using bedpan. She is now taking tidal volume 750cc on incentive spirometry. She had multiple loose stools with blood grossly notable per patient.  Anemia has worsened over past 752m from Hb 11.8 to 8.   She is currently on asa and heparin [place] which may potentiate GI blood loss.  She reports external hemorroids and hx of rectal Cancer. FOBT negative x1 , will repeat and have discussed medical plan with RN.  ? ?11/21/21- patient shares she feels better.  We discussed prognosis.  BODE index with 4 year survival <10%.  We discussed palliative care evaluation and focus on non suffering. Patient states "I like that".  She worked with PT today and was able to walk around room  ? ?11/22/21- patient optmizing  for DC home. She is on 4-5L/min [place]. She will have palliative and pulmonary outpatient follow up.  ? ?PAST MEDICAL HISTORY  ? ?Past Medical History:  ?Diagnosis Date  ? Anxiety   ? Asthma   ? CHF (congestive heart failure) ([place])   ? COPD (chronic obstructive pulmonary disease) ([place])   ? Hypertension   ? Melanoma ([place])   ? scalp (2007) & leg (2013)  ? Myocardial infarction [facility]) 09/10/1997  ? no PCI or CABG  ? Osteoporosis   ? Rheumatoid arthritis ([place])   ? SVT (supraventricular tachycardia) ([place])   ? ? ? ?SURGICAL HISTORY  ? ?Past Surgical History:  ?Procedure Laterality Date  ? ABDOMINAL HYSTERECTOMY    ? BREAST SURGERY    ? CARDIAC SURGERY    ? CARPAL  TUNNEL RELEASE    ? MELANOMA EXCISION  2007  ? scalp  ? MELANOMA EXCISION Right 2013  ? lower leg  ? SHOULDER SURGERY    ? TOTAL HIP REVISION  2015  ? VIDEO BRONCHOSCOPY Bilateral 04/08/2014  ? Procedure: VIDEO BRONCHOSCOPY WITHOUT FLUORO;  Surgeon: Juanito Doom, MD;  Location: [facility] ENDOSCOPY;  Service: Cardiopulmonary;  Laterality: Bilateral;  ? ? ? ?FAMILY HISTORY  ? ?History reviewed. No pertinent family history. ? ? ?SOCIAL HISTORY  ? ?Social History  ? ?Tobacco Use  ? Smoking status: Former  ?  Packs/day: 1.50  ?  Years: 22.00  ?  Pack years: 33.00  ?  Types: Cigarettes  ?  Quit date: 04/07/1974  ?  Years since quitting: 47.6  ? Smokeless tobacco: Never  ?Substance Use Topics  ? Alcohol use: No  ? Drug use: No  ? ? ? ?MEDICATIONS  ? ? ?Home Medication:  ? ?  ?Current Medication: ? ?Current Facility-Administered Medications:  ?  0.9 %  sodium chloride infusion, , Intravenous, PRN, Modena Jansky, MD, Stopped at 11/21/21 1637 ?  acetaminophen (TYLENOL) tablet 650 mg, 650 mg, Oral, Q6H PRN, Carlyle Lipa, MD, 650 mg at 11/21/21 1941 ?  acetylcysteine (MUCOMYST) 20 % nebulizer / oral solution 3 mL, 3 mL, Nebulization, TID, Jerusalem Brownstein, MD, 3 mL at 11/22/21 0700 ?  acidophilus (RISAQUAD) capsule 2 capsule, 2 capsule, Oral, TID, Lanney Gins, Youlanda Tomassetti, MD, 2 capsule at 11/21/21 2216 ?  albuterol (PROVENTIL) (2.5 MG/3ML) 0.083% nebulizer solution 2.5 mg, 2.5 mg, Nebulization, Q2H PRN, Ottie Glazier, MD, 2.5 mg at 11/22/21 0702 ?  amiodarone (PACERONE) tablet 200 mg, 200 mg, Oral, BID, Tang, Alanson Puls, PA-C, 200 mg at 11/21/21 2222 ?  aspirin EC tablet 81 mg, 81 mg, Oral, Daily, Tristan Schroeder, PA-C, 81 mg at 11/21/21 0841 ?  citalopram (CELEXA) tablet 20 mg, 20 mg, Oral, Daily, Benita Gutter, RPH, 20 mg at 11/21/21 0841 ?  fluticasone furoate-vilanterol (BREO ELLIPTA) 100-25 MCG/ACT 1 puff, 1 puff, Inhalation, Daily, 1 puff at 11/21/21 1055 **AND** umeclidinium bromide (INCRUSE ELLIPTA) 62.5  MCG/ACT 1 puff, 1 puff, Inhalation, Daily, [place], Kathe Becton, MD, 1 puff at 11/21/21 1055 ?  heparin injection 5,000 Units, 5,000 Units, Subcutaneous, Q8H, Kristopher Oppenheim, DO, 5,000 Units at 11/22/21 0603 ?  hydrochlorothiazide (HYDRODIURIL) tablet 12.5 mg, 12.5 mg, Oral, Daily, Andrez Grime, MD, 12.5 mg at 11/21/21 0841 ?  insulin aspart (novoLOG) injection 0-6 Units, 0-6 Units, Subcutaneous, TID WC, Hongalgi, Lenis Dickinson, MD, 1 Units at 11/20/21 1710 ?  lisinopril (ZESTRIL) tablet 40 mg, 40 mg, Oral, Daily, Tristan Schroeder, PA-C, 40 mg at 11/21/21 5465 ?  melatonin tablet  5 mg, 5 mg, Oral, QHS PRN, Sharion Settler, NP, 5 mg at 11/21/21 2216 ?  metoprolol tartrate (LOPRESSOR) tablet 12.5 mg, 12.5 mg, Oral, BID, Tang, Lily Michelle, PA-C, 12.5 mg at 11/21/21 2216 ?  montelukast (SINGULAIR) tablet 10 mg, 10 mg, Oral, QHS, Pudota, Kingsley P, MD, 10 mg at 11/21/21 2217 ?  nitroGLYCERIN (NITROSTAT) SL tablet 0.4 mg, 0.4 mg, Sublingual, Q5 min PRN, Pudota, Kathe Becton, MD ?  pantoprazole (PROTONIX) EC tablet 40 mg, 40 mg, Oral, Daily, Benita Gutter, RPH, 40 mg at 11/21/21 0841 ?  predniSONE (DELTASONE) tablet 50 mg, 50 mg, Oral, Q breakfast, Brendon Christoffel, MD, 50 mg at 11/21/21 0841 ?  sodium chloride flush (NS) 0.9 % injection 3 mL, 3 mL, Intravenous, Q12H, Hongalgi, Anand D, MD, 3 mL at 11/21/21 2217 ?  sucralfate (CARAFATE) tablet 1 g, 1 g, Oral, QID, Pudota, Kathe Becton, MD, 1 g at 11/21/21 2217 ? ? ? ?ALLERGIES  ? ?Celebrex [celecoxib] and Morphine and related ? ? ? ? ?REVIEW OF SYSTEMS  ? ? ?Review of Systems: ? ?Gen:  Denies  fever, sweats, chills weigh loss  ?HEENT: Denies blurred vision, double vision, ear pain, eye pain, hearing loss, nose bleeds, sore throat ?Cardiac:  No dizziness, chest pain or heaviness, chest tightness,edema ?Resp:   Denies cough or sputum porduction, shortness of breath,wheezing, hemoptysis,  ?Gi: Denies swallowing difficulty, stomach pain, nausea or vomiting, diarrhea,  constipation, bowel incontinence ?Gu:  Denies bladder incontinence, burning urine ?Ext:   Denies Joint pain, stiffness or swelling ?Skin: Denies  skin rash, easy bruising or bleeding or hives ?Endoc:  Denies polyuria, p

## 2021-11-22 NOTE — Progress Notes (Signed)
?[name] CARDIOLOGY CONSULT NOTE  ? ?    ?Patient ID: ?[name] ?MRN: [id number] ?DOB/AGE: [id number] y.o. ? ?Admit date: 11/14/2021 ?Referring Physician Dr. Algis Carey ?Primary Physician [name]  ?Primary Cardiologist in [place] (last seen 1 year ago)  ?Reason for Consultation atypical chest pain  ? ?HPI: The patient is an 88 year old female with a past medical history notable for HFpEF (LVEF 55-60% with myxomatous MV, mild AR with g3DD), history of MI 1999, COPD on 4 L at baseline, hypertension, rheumatoid arthritis who presented to [name] ED with shortness of breath and productive cough for 2 to 3 days.  She presented hypoxic to the 70s and severely dyspneic requiring BiPAP initially, discovered to have pneumonia.  Cardiology was consulted because of her elevated troponin and chest pain. ? ?Interval History: ?-Admits to productive cough with copious sputum, does not feel any better today. ?-Has reproducible left-sided chest wall/rib pain worse with coughing ?-Bradycardia down to the 40s overnight ?-Denies further palpitations. Still on 5 L (baseline 4) ?-Patient still very short of breath and has concerns for ability for full recovery.  Therefore will potentially be discharged home with possible home hospice ? ?Review of systems complete and found to be negative unless listed above  ? ? ?Past Medical History:  ?Diagnosis Date  ? Anxiety   ? Asthma   ? CHF (congestive heart failure) ([place])   ? COPD (chronic obstructive pulmonary disease) ([place])   ? Hypertension   ? Melanoma ([place])   ? scalp (2007) & leg (2013)  ? Myocardial infarction [facility]) 09/10/1997  ? no PCI or CABG  ? Osteoporosis   ? Rheumatoid arthritis ([place])   ? SVT (supraventricular tachycardia) ([place])   ?  ?Past Surgical History:  ?Procedure Laterality Date  ? ABDOMINAL HYSTERECTOMY    ? BREAST SURGERY    ? CARDIAC SURGERY    ? CARPAL TUNNEL RELEASE    ? MELANOMA EXCISION  2007  ? scalp  ? MELANOMA EXCISION Right 2013  ? lower leg  ?  SHOULDER SURGERY    ? TOTAL HIP REVISION  2015  ? VIDEO BRONCHOSCOPY Bilateral 04/08/2014  ? Procedure: VIDEO BRONCHOSCOPY WITHOUT FLUORO;  Surgeon: Juanito Doom, MD;  Location: [facility] ENDOSCOPY;  Service: Cardiopulmonary;  Laterality: Bilateral;  ?  ?No medications prior to admission.  ? ? ?Social History  ? ?Socioeconomic History  ? Marital status: Married  ?  Spouse name: Not on file  ? Number of children: Not on file  ? Years of education: Not on file  ? Highest education level: Not on file  ?Occupational History  ? Not on file  ?Tobacco Use  ? Smoking status: Former  ?  Packs/day: 1.50  ?  Years: 22.00  ?  Pack years: 33.00  ?  Types: Cigarettes  ?  Quit date: 04/07/1974  ?  Years since quitting: 47.6  ? Smokeless tobacco: Never  ?Substance and Sexual Activity  ? Alcohol use: No  ? Drug use: No  ? Sexual activity: Never  ?Other Topics Concern  ? Not on file  ?Social History Narrative  ? Lives in [place].  Originally from [place].  Children live in [place].  ? ?Social Determinants of Health  ? ?Financial Resource Strain: Not on file  ?Food Insecurity: Not on file  ?Transportation Needs: Not on file  ?Physical Activity: Not on file  ?Stress: Not on file  ?Social Connections: Not on file  ?Intimate Partner Violence: Not on file  ?  ?History  reviewed. No pertinent family history.  ? ? ?Review of systems complete and found to be negative unless listed above  ? ? ?PHYSICAL EXAM ?General: Elderly appearing Caucasian female, well nourished, in no acute distress.  Lying flat in PCU bed, husband at bedside ?HEENT:  Normocephalic and atraumatic. ?Neck:  No JVD.  ?Lungs: Normal work of breathing on O2 by nasal cannula.  Poor air movement, without crackles, wheezes ?Heart: HRRR . Normal S1 and S2.  3/6 systolic murmur heard at the RUSB and apex  ?Chest: Central chest wall with tenderness to palpation ?Abdomen: Non-distended appearing.  ?Msk: Normal strength and tone for age. ?Extremities: Chronic skin discoloration in  bilateral lower extremities.  No edema. ?Neuro: Alert and oriented X 3. ?Psych:  Answers questions appropriately.  ? ?Labs: ?  ?Lab Results  ?Component Value Date  ? WBC 22.5 (H) 11/21/2021  ? HGB 10.5 (L) 11/21/2021  ? HCT 33.8 (L) 11/21/2021  ? MCV 82.8 11/21/2021  ? PLT 495 (H) 11/21/2021  ?  ?Recent Labs  ?Lab 11/22/21 ?1610  ?NA 133*  ?K 3.8  ?CL 99  ?CO2 29  ?BUN 16  ?CREATININE 0.83  ?CALCIUM 8.4*  ?GLUCOSE 103*  ? ? ?Lab Results  ?Component Value Date  ? TROPONINI <0.30 02/23/2013  ? ? No results found for: CHOL ?No results found for: HDL ?No results found for: [place] ?No results found for: TRIG ?No results found for: CHOLHDL ?No results found for: LDLDIRECT  ?  ?Radiology: CT Angio Chest Pulmonary Embolism (PE) W or WO Contrast ? ?Result Date: 11/15/2021 ?CLINICAL DATA:  Chronic respiratory failure EXAM: CT ANGIOGRAPHY CHEST WITH CONTRAST TECHNIQUE: Multidetector CT imaging of the chest was performed using the standard protocol during bolus administration of intravenous contrast. Multiplanar CT image reconstructions and MIPs were obtained to evaluate the vascular anatomy. RADIATION DOSE REDUCTION: This exam was performed according to the departmental dose-optimization program which includes automated exposure control, adjustment of the mA and/or kV according to patient size and/or use of iterative reconstruction technique. CONTRAST:  40m OMNIPAQUE IOHEXOL 300 MG/ML  SOLN COMPARISON:  04/11/2021 FINDINGS: Cardiovascular: Atherosclerotic calcifications of the thoracic aorta are noted. No aneurysmal dilatation or dissection is noted. Mild cardiac enlargement is seen. No pericardial effusion is seen. Coronary calcifications are noted. Pulmonary artery shows a normal branching pattern bilaterally. No intraluminal filling defect is identified. Mediastinum/Nodes: Thoracic inlet is within normal limits. No sizable hilar or mediastinal adenopathy is noted. The esophagus as visualized is within normal limits.  Lungs/Pleura: Diffuse emphysematous changes are noted in the lungs bilaterally stable in appearance from the prior exam. Small effusion is noted on the left with lower lobe infiltrate new from the prior study. No sizable parenchymal nodules are seen. Upper Abdomen: Visualized upper abdomen is within normal limits. Musculoskeletal: Degenerative changes of the thoracic spine are noted. No definitive rib abnormality is seen. Bilateral breast implants are seen with evidence of stable intracapsular rupture similar to that noted on the prior exam. Review of the MIP images confirms the above findings. IMPRESSION: No evidence of pulmonary emboli. New left lower lobe infiltrate with associated small effusion. Aortic Atherosclerosis (ICD10-I70.0) and Emphysema (ICD10-J43.9). Electronically Signed   By: MInez CatalinaM.D.   On: 11/15/2021 21:21  ? ?DG Chest Port 1 View ? ?Result Date: 11/18/2021 ?CLINICAL DATA:  Follow-up pneumonia. EXAM: PORTABLE CHEST 1 VIEW COMPARISON:  11/14/2021 and prior radiographs FINDINGS: UPPER limits normal heart size again noted. LEFT retrocardiac LOWER lung opacity is relatively unchanged. No pneumothorax  or large pleural effusion identified. No significant changes are identified. IMPRESSION: Unchanged LEFT LOWER lung opacity which may represent atelectasis/effusion/pneumonia. Electronically Signed   By: Margarette Canada M.D.   On: 11/18/2021 10:28  ? ?DG Chest Portable 1 View ? ?Result Date: 11/14/2021 ?CLINICAL DATA:  Respiratory distress. EXAM: PORTABLE CHEST 1 VIEW COMPARISON:  Chest radiograph dated April 13, 2019 FINDINGS: The heart is enlarged. Atherosclerotic calcification of the aortic arch. Left lower lobe hazy opacity with silhouetting of the left costophrenic angle. Bilateral mild pleural/parenchymal scarring, unchanged. IMPRESSION: 1. Stable cardiomegaly. 2. Left lower lobe hazy opacity which may represent atelectasis or effusion. Underlying airspace disease can not be excluded. Electronically  Signed   By: Keane Police D.O.   On: 11/14/2021 15:59  ? ?ECHOCARDIOGRAM COMPLETE ? ?Result Date: 11/15/2021 ?   ECHOCARDIOGRAM REPORT   Patient Name:   [name] Date of Exam: 11/15/2021 Medical Rec #:  [id number]

## 2021-11-22 NOTE — Progress Notes (Signed)
Assumed care of pt at 1900. C/o mild headache overnight relieved w/ tylenol. Daughter at bedside this evening. POC reviewed w/ verbalized understanding. Pt still requiring 5 L [place] overnight. Full assessment per flowsheets. Medication administration per MAR. Call bell within reach. Pt making needs known. Comfort and safety maintained.  ?

## 2021-11-22 NOTE — Discharge Summary (Signed)
?[facility] ? ?Physician Discharge Summary  ? ?Patient ID: ?[name] ?MRN: [id number] ?DOB/AGE: [birth year] 88 y.o. ? ?Admit date: 11/14/2021 ?Discharge date: 11/22/2021   ? ?PCP: [name], MD ? ?DISCHARGE DIAGNOSES:  ?Principal Problem: ?  Acute and chronic respiratory failure with hypoxia (HCC) ?Active Problems: ?  COPD with acute exacerbation ([place]) ?  CAP (community acquired pneumonia) ?  PSVT (paroxysmal supraventricular tachycardia) ([place]) ?  Chronic diastolic CHF (congestive heart failure) ([place]) ?  Elevated troponin ?  AKI (acute kidney injury) ([place]) ?  Essential hypertension ?  Left hip pain ?  Failure to thrive in adult ?  Rheumatoid arthritis ([place]) ?  Normocytic anemia ?  Hypokalemia ? ? ?RECOMMENDATIONS FOR OUTPATIENT FOLLOW UP: ?Palliative care to follow at home ?Pulmonology to arrange outpatient follow-up ? ? ?Home Health: Home health resumed ?Equipment/Devices: None ? ?CODE STATUS: Full code ? ?DISCHARGE CONDITION: fair ? ?Diet recommendation: As before ? ?INITIAL HISTORY: ?88 year old female with medical history significant for CAD, HTN, COPD, chronic respiratory failure with hypoxia on 4 L/min nasal cannula oxygen, rheumatoid arthritis, osteoporosis and GERD presented to the ED with complaints of dyspnea.  She reportedly saw her MD for left hip pain and was diagnosed with a crack in the hip bone and prescribed some meds.  Following taking the meds prescribed, she started experiencing worsening dyspnea.  This was associated with productive cough without fever or chest pain.  Home DuoNebs did not offer relief.  She was directed to the ED.  Admitted for acute on chronic respiratory failure with hypoxia due to suspected COPD exacerbation, community-acquired pneumonia.  Needed BiPAP overnight of admission. Patient and spouse declined SNF recommended by therapies and insisted on going home.  ? ?Consultations: ?Cardiology ?Pulmonology ?Palliative care ? ? ?HOSPITAL COURSE:  ? ? ?*  Acute and chronic respiratory failure with hypoxia (HCC) ?Suspected due to advanced COPD with exacerbation and pneumonia ?Needed BiPAP overnight of admission ?RVP panel negative.  ESR 73 and CRP 27.7 significantly elevated. ?Rheumatoid factor 91.8?  Significance in the context of known RA.  Serum Fungitell is pending ?BNP 826 but down from 1878, 7 months prior.  S/p dose of IV Lasix couple days ago ?Procalcitonin 27.29 > 13.73, already on antibiotics. ?Due to elevated D-dimer and concern for PE, obtained CTA chest, negative for PE. ?As per pulmonology, on nebulized Mucomyst 3 times daily followed by MetaNeb with saline to address left base consolidation.  Repeat chest x-ray without significant change ?Patient slowly started improving.  Palliative care was involved.  Patient wanted to be full code.  Discussed with patient and daughter today.  Patient mentions that she is feeling better and would like to go home.  She already has oxygen at home. ?Prognosis is guarded and patient's family understands. ? ?COPD with acute exacerbation ([place]) ?Completed 7-day course of IV antibiotics including azithromycin and ceftriaxone. ?Treated initially with high-dose IV Solu-Medrol which was gradually tapered and eventually switched to oral prednisone. ? ?CAP (community acquired pneumonia) ?CTA chest 3/8 confirmed new left lower lobe infiltrate with associated small effusion. ?Completed 7-day course of antibiotics including azithromycin and IV ceftriaxone. ? ?PSVT (paroxysmal supraventricular tachycardia) ([place]) ?Atrial tachycardia ?Cardiology following and assisting with management ?Despite metoprolol, had sustained SVT on 3/11. ?Therefore amiodarone initiated.  Cardiology has her on amiodarone 200 Mg twice daily.  Will be discharged on same. ?Metoprolol was reduced to 12.5 twice daily due to bradycardia in the 40s. ? ?Chronic diastolic CHF (congestive heart failure) ([place]) ?Her persistent dyspnea  despite treatment for pneumonia and  COPD. ?S/p IV Lasix 40 mg x 1 with improvement. ?Now resumed home HCTZ 12.5 Mg daily. ? ?Elevated troponin ?Chest pain ?CAD ?Demand ischemia ?Chronic diastolic CHF ?PSVT ?Cardiology input appreciated and suspect demand ischemia in the context of underlying acute respiratory failure and infectious etiology. ?Clinically euvolemic but given degree of DOE and hypoxia, trial of IV Lasix 40 mg x 1. ?Musculoskeletal and reproducible chest pain has resolved ?2D echo: LVEF 55-60%, no regional wall motion abnormalities and grade 1 diastolic dysfunction. ? ?AKI (acute kidney injury) ([place]) ?Presented with creatinine of 1.5.  Resolved. ? ?Essential hypertension ?Now on metoprolol 25 Mg twice daily ?Lisinopril increased to prior home dose of 40 Mg daily. ?Resumed HCTZ 12.5 Mg daily. ? ?Left hip pain ?Patient evaluated as outpatient and told to have some chronic, exact details not available. ?Has not complained of pain here. ?Outpatient follow-up. ? ?Failure to thrive in adult ?Multifactorial due to very advanced age, frail physical health and multiple severe significant comorbidities. ?Palliative medicine input appreciated.  Full code.  Full scope treatment. ?Palliative care medicine to follow-up and include daughter in discussions. ?Outpatient follow-up. ? ?Hypokalemia ?Hypomagnesemia ? ? ?Normocytic anemia ? ? ?Rheumatoid arthritis ([place]) ?Reportedly in remission. ?Rheumatoid factor elevated, unclear significance.  Also has elevated ESR CRP. ? ?Patient stable.  Okay for discharge home today. ? ?PERTINENT LABS: ? ?The results of significant diagnostics from this hospitalization (including imaging, microbiology, ancillary and laboratory) are listed below for reference.   ? ?Microbiology: ?Recent Results (from the past 240 hour(s))  ?Blood culture (routine x 2)     Status: None  ? Collection Time: 11/14/21  3:35 PM  ? Specimen: BLOOD  ?Result Value Ref Range Status  ? Specimen Description BLOOD BLRA  Final  ? Special Requests  BOTTLES DRAWN AEROBIC AND ANAEROBIC BCAV  Final  ? Culture   Final  ?  NO GROWTH 5 DAYS ?Performed at [facility], [address], [place], [place] [address] ?  ? Report Status 11/19/2021 FINAL  Final  ?Blood culture (routine x 2)     Status: None  ? Collection Time: 11/14/21  3:40 PM  ? Specimen: BLOOD  ?Result Value Ref Range Status  ? Specimen Description BLOOD RAC  Final  ? Special Requests BOTTLES DRAWN AEROBIC AND ANAEROBIC BCAV  Final  ? Culture   Final  ?  NO GROWTH 5 DAYS ?Performed at [facility], [address] [place],  [address] ?  ? Report Status 11/19/2021 FINAL  Final  ?Resp Panel by RT-PCR (Flu A&B, Covid) Nasopharyngeal Swab     Status: None  ? Collection Time: 11/14/21  3:45 PM  ? Specimen: Nasopharyngeal Swab; Nasopharyngeal(NP) swabs in vial transport medium  ?Result Value Ref Range Status  ? SARS Coronavirus 2 by RT PCR NEGATIVE NEGATIVE Final  ?  Comment: (NOTE) ?SARS-CoV-2 target nucleic acids are NOT DETECTED. ? ?The SARS-CoV-2 RNA is generally detectable in upper respiratory ?specimens during the acute phase of infection. The lowest ?concentration of SARS-CoV-2 viral copies this assay can detect is ?138 copies/mL. A negative result does not preclude SARS-Cov-2 ?infection and should not be used as the sole basis for treatment or ?other patient management decisions. A negative result may occur with  ?improper specimen collection/handling, submission of specimen other ?than nasopharyngeal swab, presence of viral mutation(s) within the ?areas targeted by this assay, and inadequate number of viral ?copies(<138 copies/mL). A negative result must be combined with ?clinical observations, patient  history, and epidemiological ?information. The expected result is Negative. ? ?Fact Sheet for Patients:  ?EntrepreneurPulse.com.au ? ?Fact Sheet for Healthcare Providers:  ?IncredibleEmployment.be ? ?This test is no t yet approved or cleared  by the [place] FDA and  ?has been authorized for detection and/or diagnosis of SARS-CoV-2 by ?FDA under an Emergency Use Authorization (EUA). This EUA will remain  ?in effect (meaning this test can be [place]

## 2021-11-22 NOTE — TOC Progression Note (Addendum)
Transition of Care (TOC) - Progression Note  ? ? ?Patient Details  ?Name: [name] ?MRN: [id number] ?Date of Birth: [birth year] ? ?Transition of Care (TOC) CM/SW Contact  ?Alberteen Sam, LCSW ?Phone Number: ?11/22/2021, 10:19 AM ? ?Clinical Narrative:    ? ?CSW met with patient and daughter at bedside, confirmed interest in palliative services. CSW notes they are already set up with [facility] for [facility] services, they are agreeable to receive palliative services at discharge referral made with [facility] Lorayne Bender) as [facility] reports they do not have outpatient palliative services. They are eager to dc today, daughter reports patient has walker, cane, oxygen at home and that family provides 24/7 support. Family to transport home at discharge and they have brought their concentrator in the room, ready to dc.  ? ?No further dc needs identified at this time. Malachy Mood with [facility] and Lorayne Bender with [facility] informed of dc today.  ? ? ?Expected Discharge Plan: Home/Self Care ?Barriers to Discharge: Continued Medical Work up ? ?Expected Discharge Plan and Services ?Expected Discharge Plan: Home/Self Care ?  ?  ?Post Acute Care Choice: NA ?Living arrangements for the past 2 months: [place] ?                ?  ?  ?  ?  ?  ?  ?  ?  ?  ?  ? ? ?Social Determinants of Health (SDOH) Interventions ?  ? ?Readmission Risk Interventions ?Readmission Risk Prevention Plan 11/16/2021  ?Transportation Screening Complete  ?PCP or Specialist Appt within 3-5 Days Complete  ?Social Work Consult for [place] Planning/Counseling Complete  ?Palliative Care Screening Not Applicable  ?Medication Review Press photographer) Complete  ?Some recent data might be hidden  ? ? ?

## 2021-11-22 NOTE — Progress Notes (Signed)
[facility] Liaison Note ? ?Notified by [facility] of patient/family request of [facility] Paliative services. ? ?[facility] hospital liaison will follow patient for discharge disposition.  ? ?Please call with any questions/concerns.  ?  ?Thank you for the opportunity to participate in this patient's care. ?  ?Daphene Calamity, MSW ?[place]  ?[phone] ? ?

## 2021-11-22 NOTE — Progress Notes (Signed)
Progress Note: ? ?Received request to speak with patient regarding outpatient palliative services. Chart reviewed.  ? ?Prior to seeing patient, TOC SW met with patient and daughter and established d/c plan for [facility] outpatient palliative. Patient is discharging shortly. ? ?No further Palliative needs at this time. Please contact PMT if any other needs arise prior to d/c. ? ? L. , DNP, FNP-BC ?Palliative Medicine Team ?Team Phone # [phone] ? ?NO CHARGE ?

## 2021-12-08 IMAGING — DX DG CHEST 1V PORT
1 series · 1 of 1 positions shown · non-contrast
Comparison: Radiograph 04/07/2014

CLINICAL DATA: Shortness of breath

EXAM:
PORTABLE CHEST 1 VIEW

[chest ap]
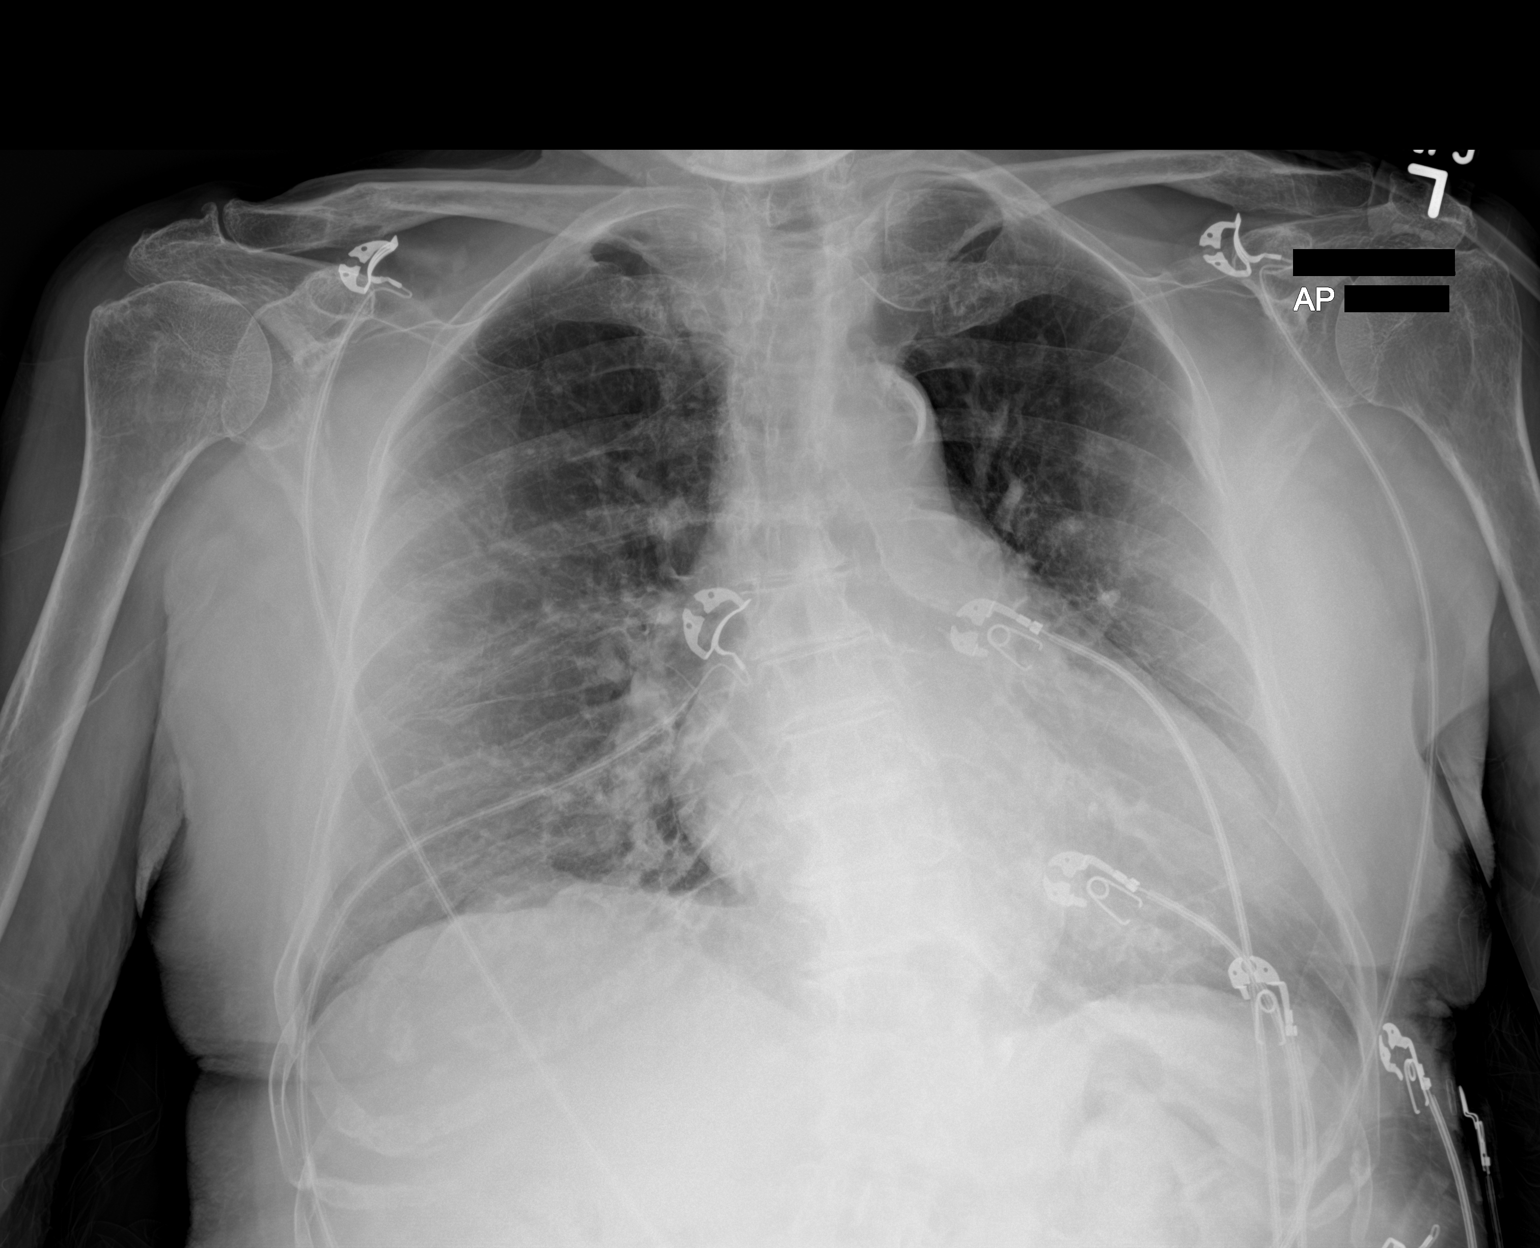

[1 of 1 positions shown; findings below may reference images not displayed]

FINDINGS: Unchanged, enlarged cardiac silhouette. There are chronic
interstitial opacities bilaterally. There is no new focal airspace
disease. There is no large pleural effusion or visible pneumothorax.
There is bilateral shoulder degenerative change. No acute osseous
abnormality. Unchanged thoracolumbar scoliosis with multilevel
degenerative disc disease.
IMPRESSION: Unchanged cardiomegaly with chronic parenchymal changes. No new
focal airspace consolidation.

## 2021-12-09 IMAGING — DX DG CHEST 1V PORT
1 series · 1 of 1 positions shown · non-contrast
Comparison: CT 04/11/2021.  Chest x-ray 04/11/2021, 02/22/2013.

CLINICAL DATA: Shortness of breath.

EXAM:
PORTABLE CHEST 1 VIEW

[chest ap]
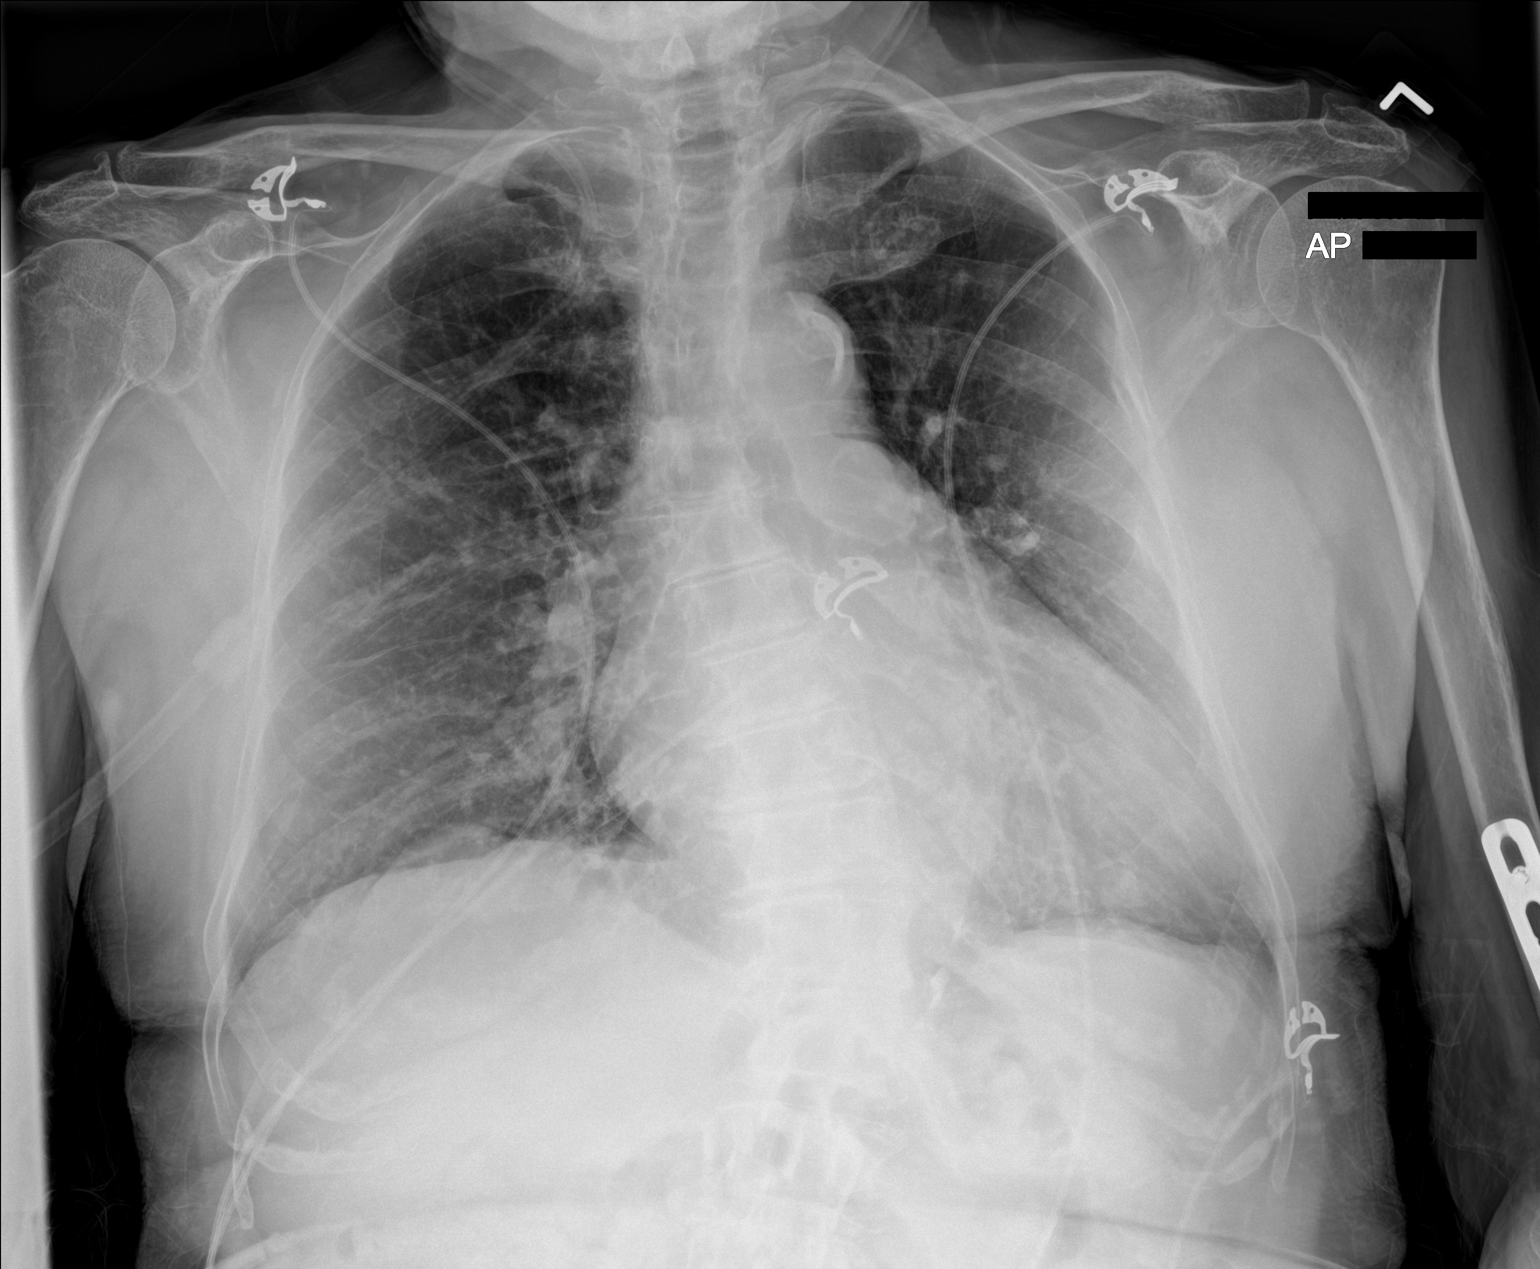

[1 of 1 positions shown; findings below may reference images not displayed]

FINDINGS: Mediastinum and hilar structures normal. Cardiomegaly. No pulmonary
venous congestion. Changes of pleural-parenchymal scarring again
noted. No acute infiltrate. No pleural effusion pneumothorax.
Degenerative changes scoliosis thoracic spine.
IMPRESSION: 1.  Cardiomegaly.  No pulmonary venous congestion.

2. Changes of pleural-parenchymal scarring. No acute pulmonary
abnormality identified.

## 2021-12-09 DEATH — deceased
